# Patient Record
Sex: Female | Born: 1955 | Race: White | Hispanic: No | Marital: Single | Smoking: Never smoker
Health system: Southern US, Community
[De-identification: ages and names within clinical notes are randomized; demographics above are authoritative.]

## PROBLEM LIST (undated history)

## (undated) DIAGNOSIS — K219 Gastro-esophageal reflux disease without esophagitis: Secondary | ICD-10-CM

## (undated) DIAGNOSIS — M1990 Unspecified osteoarthritis, unspecified site: Secondary | ICD-10-CM

## (undated) DIAGNOSIS — E669 Obesity, unspecified: Secondary | ICD-10-CM

## (undated) DIAGNOSIS — R51 Headache: Secondary | ICD-10-CM

## (undated) DIAGNOSIS — N390 Urinary tract infection, site not specified: Secondary | ICD-10-CM

## (undated) DIAGNOSIS — R519 Headache, unspecified: Secondary | ICD-10-CM

## (undated) DIAGNOSIS — Z9889 Other specified postprocedural states: Secondary | ICD-10-CM

## (undated) DIAGNOSIS — D649 Anemia, unspecified: Secondary | ICD-10-CM

## (undated) DIAGNOSIS — R112 Nausea with vomiting, unspecified: Secondary | ICD-10-CM

## (undated) DIAGNOSIS — I10 Essential (primary) hypertension: Secondary | ICD-10-CM

## (undated) DIAGNOSIS — K449 Diaphragmatic hernia without obstruction or gangrene: Secondary | ICD-10-CM

## (undated) DIAGNOSIS — T7840XA Allergy, unspecified, initial encounter: Secondary | ICD-10-CM

## (undated) DIAGNOSIS — J45909 Unspecified asthma, uncomplicated: Secondary | ICD-10-CM

## (undated) DIAGNOSIS — K5730 Diverticulosis of large intestine without perforation or abscess without bleeding: Secondary | ICD-10-CM

## (undated) DIAGNOSIS — H269 Unspecified cataract: Secondary | ICD-10-CM

## (undated) DIAGNOSIS — Z9109 Other allergy status, other than to drugs and biological substances: Secondary | ICD-10-CM

## (undated) DIAGNOSIS — Z87442 Personal history of urinary calculi: Secondary | ICD-10-CM

## (undated) DIAGNOSIS — N200 Calculus of kidney: Secondary | ICD-10-CM

## (undated) DIAGNOSIS — R42 Dizziness and giddiness: Secondary | ICD-10-CM

## (undated) DIAGNOSIS — E785 Hyperlipidemia, unspecified: Secondary | ICD-10-CM

## (undated) HISTORY — PX: UPPER GASTROINTESTINAL ENDOSCOPY: SHX188

## (undated) HISTORY — DX: Gastro-esophageal reflux disease without esophagitis: K21.9

## (undated) HISTORY — DX: Unspecified osteoarthritis, unspecified site: M19.90

## (undated) HISTORY — DX: Diaphragmatic hernia without obstruction or gangrene: K44.9

## (undated) HISTORY — DX: Diverticulosis of large intestine without perforation or abscess without bleeding: K57.30

## (undated) HISTORY — PX: EYE SURGERY: SHX253

## (undated) HISTORY — DX: Essential (primary) hypertension: I10

## (undated) HISTORY — DX: Other allergy status, other than to drugs and biological substances: Z91.09

## (undated) HISTORY — DX: Hyperlipidemia, unspecified: E78.5

## (undated) HISTORY — PX: ESOPHAGEAL DILATION: SHX303

## (undated) HISTORY — PX: WISDOM TOOTH EXTRACTION: SHX21

## (undated) HISTORY — DX: Calculus of kidney: N20.0

## (undated) HISTORY — DX: Unspecified asthma, uncomplicated: J45.909

## (undated) HISTORY — DX: Obesity, unspecified: E66.9

## (undated) HISTORY — PX: COLONOSCOPY: SHX174

## (undated) HISTORY — PX: BREAST BIOPSY: SHX20

## (undated) HISTORY — DX: Allergy, unspecified, initial encounter: T78.40XA

## (undated) HISTORY — DX: Unspecified cataract: H26.9

---

## 1985-01-04 HISTORY — PX: CHOLECYSTECTOMY: SHX55

## 1997-05-15 ENCOUNTER — Ambulatory Visit (HOSPITAL_COMMUNITY): Admission: RE | Admit: 1997-05-15 | Discharge: 1997-05-15 | Payer: Self-pay | Admitting: Emergency Medicine

## 1997-05-20 ENCOUNTER — Ambulatory Visit (HOSPITAL_COMMUNITY): Admission: RE | Admit: 1997-05-20 | Discharge: 1997-05-20 | Payer: Self-pay | Admitting: Emergency Medicine

## 1997-05-21 ENCOUNTER — Ambulatory Visit (HOSPITAL_COMMUNITY): Admission: RE | Admit: 1997-05-21 | Discharge: 1997-05-21 | Payer: Self-pay | Admitting: Emergency Medicine

## 1997-11-21 ENCOUNTER — Encounter: Payer: Self-pay | Admitting: Emergency Medicine

## 1997-11-21 ENCOUNTER — Ambulatory Visit (HOSPITAL_COMMUNITY): Admission: RE | Admit: 1997-11-21 | Discharge: 1997-11-21 | Payer: Self-pay

## 1998-07-31 ENCOUNTER — Ambulatory Visit (HOSPITAL_COMMUNITY): Admission: RE | Admit: 1998-07-31 | Discharge: 1998-07-31 | Payer: Self-pay | Admitting: Emergency Medicine

## 1998-07-31 ENCOUNTER — Encounter: Payer: Self-pay | Admitting: Emergency Medicine

## 1998-08-06 ENCOUNTER — Ambulatory Visit (HOSPITAL_COMMUNITY): Admission: RE | Admit: 1998-08-06 | Discharge: 1998-08-06 | Payer: Self-pay | Admitting: Emergency Medicine

## 1998-08-06 ENCOUNTER — Encounter: Payer: Self-pay | Admitting: Emergency Medicine

## 1999-01-01 ENCOUNTER — Other Ambulatory Visit: Admission: RE | Admit: 1999-01-01 | Discharge: 1999-01-01 | Payer: Self-pay | Admitting: *Deleted

## 1999-02-09 ENCOUNTER — Ambulatory Visit (HOSPITAL_COMMUNITY): Admission: RE | Admit: 1999-02-09 | Discharge: 1999-02-09 | Payer: Self-pay | Admitting: *Deleted

## 1999-02-09 ENCOUNTER — Encounter: Payer: Self-pay | Admitting: *Deleted

## 1999-08-10 ENCOUNTER — Encounter: Payer: Self-pay | Admitting: *Deleted

## 1999-08-10 ENCOUNTER — Ambulatory Visit (HOSPITAL_COMMUNITY): Admission: RE | Admit: 1999-08-10 | Discharge: 1999-08-10 | Payer: Self-pay | Admitting: *Deleted

## 2000-01-12 ENCOUNTER — Encounter: Payer: Self-pay | Admitting: *Deleted

## 2000-01-12 ENCOUNTER — Encounter: Admission: RE | Admit: 2000-01-12 | Discharge: 2000-01-12 | Payer: Self-pay | Admitting: *Deleted

## 2000-01-12 ENCOUNTER — Other Ambulatory Visit: Admission: RE | Admit: 2000-01-12 | Discharge: 2000-01-12 | Payer: Self-pay | Admitting: *Deleted

## 2000-08-08 ENCOUNTER — Ambulatory Visit (HOSPITAL_COMMUNITY): Admission: RE | Admit: 2000-08-08 | Discharge: 2000-08-08 | Payer: Self-pay | Admitting: *Deleted

## 2000-08-08 ENCOUNTER — Encounter: Payer: Self-pay | Admitting: *Deleted

## 2000-09-04 ENCOUNTER — Encounter: Payer: Self-pay | Admitting: Emergency Medicine

## 2000-09-04 ENCOUNTER — Emergency Department (HOSPITAL_COMMUNITY): Admission: EM | Admit: 2000-09-04 | Discharge: 2000-09-04 | Payer: Self-pay | Admitting: Emergency Medicine

## 2001-03-15 ENCOUNTER — Other Ambulatory Visit: Admission: RE | Admit: 2001-03-15 | Discharge: 2001-03-15 | Payer: Self-pay | Admitting: Internal Medicine

## 2001-08-16 ENCOUNTER — Ambulatory Visit (HOSPITAL_COMMUNITY): Admission: RE | Admit: 2001-08-16 | Discharge: 2001-08-16 | Payer: Self-pay | Admitting: Internal Medicine

## 2001-08-16 ENCOUNTER — Encounter: Payer: Self-pay | Admitting: Internal Medicine

## 2002-08-20 ENCOUNTER — Ambulatory Visit (HOSPITAL_COMMUNITY): Admission: RE | Admit: 2002-08-20 | Discharge: 2002-08-20 | Payer: Self-pay | Admitting: Internal Medicine

## 2002-08-20 ENCOUNTER — Encounter: Payer: Self-pay | Admitting: Internal Medicine

## 2003-07-22 ENCOUNTER — Other Ambulatory Visit: Admission: RE | Admit: 2003-07-22 | Discharge: 2003-07-22 | Payer: Self-pay | Admitting: Internal Medicine

## 2003-09-16 ENCOUNTER — Ambulatory Visit (HOSPITAL_COMMUNITY): Admission: RE | Admit: 2003-09-16 | Discharge: 2003-09-16 | Payer: Self-pay | Admitting: Internal Medicine

## 2004-07-27 ENCOUNTER — Encounter: Admission: RE | Admit: 2004-07-27 | Discharge: 2004-07-27 | Payer: Self-pay | Admitting: Internal Medicine

## 2005-01-04 HISTORY — PX: KNEE ARTHROSCOPY: SUR90

## 2005-09-08 ENCOUNTER — Encounter: Admission: RE | Admit: 2005-09-08 | Discharge: 2005-09-08 | Payer: Self-pay | Admitting: Internal Medicine

## 2006-01-04 DIAGNOSIS — N200 Calculus of kidney: Secondary | ICD-10-CM

## 2006-01-04 HISTORY — DX: Calculus of kidney: N20.0

## 2006-09-21 ENCOUNTER — Encounter: Admission: RE | Admit: 2006-09-21 | Discharge: 2006-09-21 | Payer: Self-pay | Admitting: Internal Medicine

## 2007-10-02 ENCOUNTER — Encounter: Admission: RE | Admit: 2007-10-02 | Discharge: 2007-10-02 | Payer: Self-pay | Admitting: Internal Medicine

## 2007-11-01 ENCOUNTER — Other Ambulatory Visit: Admission: RE | Admit: 2007-11-01 | Discharge: 2007-11-01 | Payer: Self-pay | Admitting: Internal Medicine

## 2007-11-03 ENCOUNTER — Emergency Department (HOSPITAL_COMMUNITY): Admission: EM | Admit: 2007-11-03 | Discharge: 2007-11-04 | Payer: Self-pay | Admitting: Emergency Medicine

## 2008-10-02 ENCOUNTER — Encounter: Admission: RE | Admit: 2008-10-02 | Discharge: 2008-10-02 | Payer: Self-pay | Admitting: Internal Medicine

## 2008-11-22 ENCOUNTER — Encounter (INDEPENDENT_AMBULATORY_CARE_PROVIDER_SITE_OTHER): Payer: Self-pay | Admitting: *Deleted

## 2008-12-10 ENCOUNTER — Encounter (INDEPENDENT_AMBULATORY_CARE_PROVIDER_SITE_OTHER): Payer: Self-pay | Admitting: *Deleted

## 2008-12-11 ENCOUNTER — Ambulatory Visit: Payer: Self-pay | Admitting: Gastroenterology

## 2009-01-22 ENCOUNTER — Ambulatory Visit: Payer: Self-pay | Admitting: Gastroenterology

## 2009-10-20 ENCOUNTER — Encounter: Admission: RE | Admit: 2009-10-20 | Discharge: 2009-10-20 | Payer: Self-pay | Admitting: Internal Medicine

## 2009-12-19 ENCOUNTER — Other Ambulatory Visit
Admission: RE | Admit: 2009-12-19 | Discharge: 2009-12-19 | Payer: Self-pay | Source: Home / Self Care | Admitting: Internal Medicine

## 2010-01-26 ENCOUNTER — Encounter: Payer: Self-pay | Admitting: Internal Medicine

## 2010-02-05 NOTE — Procedures (Signed)
Summary: Colonoscopy  Patient: Francetta Ilg Note: All result statuses are Final unless otherwise noted.  Tests: (1) Colonoscopy (COL)   COL Colonoscopy           DONE     [facility]     [address]     [place], [place]  [address]           COLONOSCOPY PROCEDURE REPORT           PATIENT:  Christine [name], Christine [name]  MR#:  [id number]     BIRTHDATE:  [birth year], 53 yrs. old  GENDER:  female           ENDOSCOPIST:  Vania Rea. Jarold Motto, MD, [facility]     Referred by:  Marisue Brooklyn, D.O.           PROCEDURE DATE:  01/22/2009     PROCEDURE:  Higher-risk screening colonoscopy G0105           ASA CLASS:  Class II     INDICATIONS:  family history of colon cancer MOTHER           MEDICATIONS:   Fentanyl 50 mcg IV, Versed 7 mg IV           DESCRIPTION OF PROCEDURE:   After the risks benefits and     alternatives of the procedure were thoroughly explained, informed     consent was obtained.  Digital rectal exam was performed and     revealed no abnormalities.   The LB [id number] endoscope     was introduced through the anus and advanced to the cecum, which     was identified by both the appendix and ileocecal valve, without     limitations.  The quality of the prep was excellent, using     MoviPrep.  The instrument was then slowly withdrawn as the colon     was fully examined.     <<PROCEDUREIMAGES>>           FINDINGS:  Moderate diverticulosis was found sigmoid to descending     No polyps or cancers were seen.  This was otherwise a normal     examination of the colon.   Retroflexed views in the rectum     revealed no abnormalities.    The scope was then withdrawn from     the patient and the procedure completed.           COMPLICATIONS:  None           ENDOSCOPIC IMPRESSION:     1) Moderate diverticulosis in the sigmoid to descending     2) No polyps or cancers     3) Otherwise normal examination     RECOMMENDATIONS:     1) high fiber diet     2) Given your significant  family history of colon cancer, you     should have a repeat colonoscopy in 5 years           REPEAT EXAM:  No           ______________________________     Vania Rea. Jarold Motto, MD, [facility]           CC:           n.     eSIGNED:   Vania Rea. Patterson at 01/22/2009 11:46 AM           Domenic Moras, [id number]  Note: An exclamation mark (!) indicates a result that was not dispersed into the  flowsheet. Document Creation Date: 01/22/2009 11:45 AM _______________________________________________________________________  (1) Order result status: Final Collection or observation date-time: 01/22/2009 11:40 Requested date-time:  Receipt date-time:  Reported date-time:  Referring Physician:   Ordering Physician: Sheryn Bison [id number]) Specimen Source:  Source: Launa Grill Order Number: [id number] Lab site:   Appended Document: Colonoscopy    Clinical Lists Changes  Observations: Added new observation of COLONNXTDUE: 01/2014 (01/22/2009 13:35)

## 2010-10-15 ENCOUNTER — Other Ambulatory Visit: Payer: Self-pay | Admitting: Internal Medicine

## 2010-10-15 DIAGNOSIS — Z1231 Encounter for screening mammogram for malignant neoplasm of breast: Secondary | ICD-10-CM

## 2010-11-20 ENCOUNTER — Ambulatory Visit
Admission: RE | Admit: 2010-11-20 | Discharge: 2010-11-20 | Disposition: A | Discharge status: Home / Self Care | Payer: BC Managed Care – PPO | Source: Ambulatory Visit | Attending: Internal Medicine | Admitting: Internal Medicine

## 2010-11-20 DIAGNOSIS — Z1231 Encounter for screening mammogram for malignant neoplasm of breast: Secondary | ICD-10-CM

## 2011-04-01 ENCOUNTER — Other Ambulatory Visit: Payer: Self-pay

## 2011-12-23 ENCOUNTER — Other Ambulatory Visit: Payer: Self-pay | Admitting: Internal Medicine

## 2011-12-23 DIAGNOSIS — Z1231 Encounter for screening mammogram for malignant neoplasm of breast: Secondary | ICD-10-CM

## 2012-01-14 ENCOUNTER — Ambulatory Visit
Admission: RE | Admit: 2012-01-14 | Discharge: 2012-01-14 | Disposition: A | Discharge status: Home / Self Care | Payer: BC Managed Care – PPO | Source: Ambulatory Visit | Attending: Internal Medicine | Admitting: Internal Medicine

## 2012-01-14 DIAGNOSIS — Z1231 Encounter for screening mammogram for malignant neoplasm of breast: Secondary | ICD-10-CM

## 2013-01-17 ENCOUNTER — Other Ambulatory Visit: Payer: Self-pay

## 2013-01-17 DIAGNOSIS — Z1231 Encounter for screening mammogram for malignant neoplasm of breast: Secondary | ICD-10-CM

## 2013-02-19 ENCOUNTER — Ambulatory Visit
Admission: RE | Admit: 2013-02-19 | Discharge: 2013-02-19 | Disposition: A | Discharge status: Home / Self Care | Payer: BC Managed Care – PPO | Source: Ambulatory Visit

## 2013-02-19 DIAGNOSIS — Z1231 Encounter for screening mammogram for malignant neoplasm of breast: Secondary | ICD-10-CM

## 2013-04-06 ENCOUNTER — Other Ambulatory Visit: Payer: Self-pay

## 2013-04-06 MED ORDER — VALSARTAN-HYDROCHLOROTHIAZIDE 320-12.5 MG PO TABS: 1.0000 | ORAL_TABLET | Freq: Every day | ORAL | Status: DC

## 2013-04-06 MED ORDER — METHOCARBAMOL 750 MG PO TABS: 750.0000 mg | ORAL_TABLET | Freq: Two times a day (BID) | ORAL | Status: DC | PRN

## 2013-04-10 ENCOUNTER — Encounter: Payer: Self-pay | Admitting: Emergency Medicine

## 2013-04-11 ENCOUNTER — Encounter: Payer: Self-pay | Admitting: Emergency Medicine

## 2013-05-16 ENCOUNTER — Ambulatory Visit (INDEPENDENT_AMBULATORY_CARE_PROVIDER_SITE_OTHER): Payer: BC Managed Care – PPO | Admitting: Family Medicine

## 2013-05-16 VITALS — BP 134/72 | HR 100 | Temp 98.3°F | Resp 20 | Ht 62.0 in | Wt 258.0 lb

## 2013-05-16 DIAGNOSIS — R05 Cough: Secondary | ICD-10-CM

## 2013-05-16 DIAGNOSIS — H6520 Chronic serous otitis media, unspecified ear: Secondary | ICD-10-CM

## 2013-05-16 DIAGNOSIS — R059 Cough, unspecified: Secondary | ICD-10-CM

## 2013-05-16 DIAGNOSIS — J029 Acute pharyngitis, unspecified: Secondary | ICD-10-CM

## 2013-05-16 MED ORDER — HYDROCOD POLST-CHLORPHEN POLST 10-8 MG/5ML PO LQCR: 5.0000 mL | Freq: Every evening | ORAL | Status: DC | PRN

## 2013-05-16 MED ORDER — AMOXICILLIN-POT CLAVULANATE 875-125 MG PO TABS: 1.0000 | ORAL_TABLET | Freq: Two times a day (BID) | ORAL | Status: DC

## 2013-05-16 NOTE — Progress Notes (Signed)
This chart was scribed for Robyn Haber, MD by Vernell Barrier, Medical Scribe. This patient's care was started at 8:45 PM   Christine [name] MRN: [id number], DOB: 02-23-1955, 58 y.o. Date of Encounter: 05/16/2013, 8:44 PM  Primary Physician: No PCP Per Patient  Chief Complaint:  Chief Complaint  Patient presents with   Sinusitis    x's 4 days   Cough    x's 4 days    HPI: 57 y.o. year old female w/ hx of middle ear infections presents with a 4 day history of nasal congestion, post nasal drip, sore throat, and cough. States post nasal drip has been causing sore throat. Has been using Claritin D and a Neti pot to help clear out sinues. Gargled salt water last night before bed and believes that may have irritated throat even more. Does feel as if sinuses have been cleared out. Afebrile. No chills.   Past Medical History  Diagnosis Date   Environmental allergies    Arthritis    Asthma      Home Meds: Prior to Admission medications   Medication Sig Start Date End Date Taking? Authorizing Provider  meloxicam (MOBIC) 7.5 MG tablet Take 7.5 mg by mouth daily.   Yes Historical Provider, MD  methocarbamol (ROBAXIN) 750 MG tablet Take 1 tablet (750 mg total) by mouth 2 (two) times daily as needed for muscle spasms. 04/06/13  Yes Vicie Mutters, PA-C  valsartan-hydrochlorothiazide (DIOVAN-HCT) 320-12.5 MG per tablet Take 1 tablet by mouth daily. 04/06/13  Yes Vicie Mutters, PA-C    Allergies:  Allergies  Allergen Reactions   Codeine     REACTION: nausea/vomiting    History   Social History   Marital Status: Single    Spouse Name: N/A    Number of Children: N/A   Years of Education: N/A   Occupational History   Not on file.   Social History Main Topics   Smoking status: Never Smoker    Smokeless tobacco: Not on file   Alcohol Use: Not on file   Drug Use: Not on file   Sexual Activity: Not on file   Other Topics Concern   Not on file   Social History  Narrative   No narrative on file     Review of Systems: Constitutional: negative for chills, fever, night sweats or weight changes HEENT: Positive for sore throat, cough, postnasal drip, nasal congestion Cardiovascular: negative for chest pain or palpitations Respiratory: negative for hemoptysis, wheezing, or shortness of breath Abdominal: negative for abdominal pain, nausea, vomiting or diarrhea Dermatological: negative for rash Neurologic: negative for headache   Physical Exam: Blood pressure 134/72, pulse 100, temperature 98.3 F (36.8 C), temperature source Oral, resp. rate 20, height 5\' 2"  (1.575 m), weight 258 lb (117.028 kg), last menstrual period 04/16/2013, SpO2 97.00%., Body mass index is 47.18 kg/(m^2). General: Well developed, well nourished, in no acute distress. Head: Normocephalic, atraumatic, eyes without discharge, sclera non-icteric, nares are congested. Bilateral auditory canals clear. No sinus TTP. Oral cavity moist, dentition normal. Posterior pharynx with post nasal drip and mild erythema. No peritonsillar abscess or tonsillar exudate. Neck: Supple. No thyromegaly. Full ROM. No lymphadenopathy. Lungs: Coarse breath sounds bilaterally without wheezes, rales, or rhonchi. Breathing is unlabored.  Heart: RRR with S1 S2. No murmurs, rubs, or gallops appreciated. Msk:  Strength and tone normal for age. Extremities: No clubbing or cyanosis. No edema. Neuro: Alert and oriented X 3. Moves all extremities spontaneously. CNII-XII grossly in tact. Psych:  Responds to questions  appropriately with a normal affect.   Patient's throat is very red and mildly swollen Neck is supple with no adenopathy Patient's tympanic membranes are markedly scarred and retracted with apparent healing of the perforation on the left side. ASSESSMENT AND PLAN:  58 y.o. year old female Cough - Plan: chlorpheniramine-HYDROcodone (TUSSIONEX PENNKINETIC ER) 10-8 MG/5ML LQCR, amoxicillin-clavulanate  (AUGMENTIN) 875-125 MG per tablet  Chronic serous OM (otitis media) - Plan: amoxicillin-clavulanate (AUGMENTIN) 875-125 MG per tablet  Acute pharyngitis - Plan: amoxicillin-clavulanate (AUGMENTIN) 875-125 MG per tablet, Culture, Group A Strep   - -Tussinex & Augmentin -Rest/fluids -RTC precautions -RTC 3-5 days if no improvement  Signed, Robyn Haber, MD 05/16/2013 8:44 PM

## 2013-05-19 LAB — CULTURE, GROUP A STREP: Organism ID, Bacteria: NORMAL

## 2013-05-29 ENCOUNTER — Other Ambulatory Visit: Payer: Self-pay | Admitting: *Deleted

## 2013-05-29 MED ORDER — MELOXICAM 7.5 MG PO TABS: 7.5000 mg | ORAL_TABLET | Freq: Every day | ORAL | Status: DC

## 2013-05-31 ENCOUNTER — Other Ambulatory Visit: Payer: Self-pay | Admitting: *Deleted

## 2013-05-31 ENCOUNTER — Other Ambulatory Visit: Payer: Self-pay | Admitting: Emergency Medicine

## 2013-05-31 MED ORDER — LANSOPRAZOLE 30 MG PO CPDR: 30.0000 mg | DELAYED_RELEASE_CAPSULE | Freq: Every day | ORAL | Status: DC

## 2013-06-01 ENCOUNTER — Ambulatory Visit (INDEPENDENT_AMBULATORY_CARE_PROVIDER_SITE_OTHER): Payer: BC Managed Care – PPO | Admitting: Emergency Medicine

## 2013-06-01 ENCOUNTER — Encounter: Payer: Self-pay | Admitting: Emergency Medicine

## 2013-06-01 ENCOUNTER — Other Ambulatory Visit: Payer: Self-pay | Admitting: Emergency Medicine

## 2013-06-01 VITALS — BP 122/80 | HR 88 | Temp 97.8°F | Resp 18 | Ht 62.0 in | Wt 255.0 lb

## 2013-06-01 DIAGNOSIS — K449 Diaphragmatic hernia without obstruction or gangrene: Secondary | ICD-10-CM

## 2013-06-01 DIAGNOSIS — E785 Hyperlipidemia, unspecified: Secondary | ICD-10-CM

## 2013-06-01 DIAGNOSIS — E669 Obesity, unspecified: Secondary | ICD-10-CM

## 2013-06-01 DIAGNOSIS — K219 Gastro-esophageal reflux disease without esophagitis: Secondary | ICD-10-CM

## 2013-06-01 DIAGNOSIS — M8580 Other specified disorders of bone density and structure, unspecified site: Secondary | ICD-10-CM

## 2013-06-01 DIAGNOSIS — Z0000 Encounter for general adult medical examination without abnormal findings: Secondary | ICD-10-CM

## 2013-06-01 DIAGNOSIS — E782 Mixed hyperlipidemia: Secondary | ICD-10-CM

## 2013-06-01 DIAGNOSIS — I10 Essential (primary) hypertension: Secondary | ICD-10-CM

## 2013-06-01 DIAGNOSIS — Z1212 Encounter for screening for malignant neoplasm of rectum: Secondary | ICD-10-CM

## 2013-06-01 DIAGNOSIS — D649 Anemia, unspecified: Secondary | ICD-10-CM

## 2013-06-01 DIAGNOSIS — Z111 Encounter for screening for respiratory tuberculosis: Secondary | ICD-10-CM

## 2013-06-01 LAB — CBC WITH DIFFERENTIAL/PLATELET
BASOS PCT: 1 % (ref 0–1)
Basophils Absolute: 0.1 10*3/uL (ref 0.0–0.1)
Eosinophils Absolute: 0.3 10*3/uL (ref 0.0–0.7)
Eosinophils Relative: 4 % (ref 0–5)
HCT: 39.3 % (ref 36.0–46.0)
HEMOGLOBIN: 13.3 g/dL (ref 12.0–15.0)
LYMPHS ABS: 2.5 10*3/uL (ref 0.7–4.0)
LYMPHS PCT: 29 % (ref 12–46)
MCH: 29.7 pg (ref 26.0–34.0)
MCHC: 33.8 g/dL (ref 30.0–36.0)
MCV: 87.7 fL (ref 78.0–100.0)
MONO ABS: 0.6 10*3/uL (ref 0.1–1.0)
MONOS PCT: 7 % (ref 3–12)
Neutro Abs: 5.1 10*3/uL (ref 1.7–7.7)
Neutrophils Relative %: 59 % (ref 43–77)
Platelets: 324 10*3/uL (ref 150–400)
RBC: 4.48 MIL/uL (ref 3.87–5.11)
RDW: 14 % (ref 11.5–15.5)
WBC: 8.6 10*3/uL (ref 4.0–10.5)

## 2013-06-01 LAB — HEMOGLOBIN A1C
Hgb A1c MFr Bld: 5.7 % — ABNORMAL HIGH (ref <5.7)
MEAN PLASMA GLUCOSE: 117 mg/dL — AB (ref <117)

## 2013-06-01 MED ORDER — MELOXICAM 7.5 MG PO TABS: 7.5000 mg | ORAL_TABLET | Freq: Every day | ORAL | Status: DC

## 2013-06-01 MED ORDER — METHOCARBAMOL 750 MG PO TABS: 750.0000 mg | ORAL_TABLET | Freq: Two times a day (BID) | ORAL | Status: DC | PRN

## 2013-06-01 MED ORDER — FLUCONAZOLE 150 MG PO TABS: 150.0000 mg | ORAL_TABLET | ORAL | Status: DC

## 2013-06-01 MED ORDER — VALSARTAN-HYDROCHLOROTHIAZIDE 320-12.5 MG PO TABS: 1.0000 | ORAL_TABLET | Freq: Every day | ORAL | Status: DC

## 2013-06-01 MED ORDER — LANSOPRAZOLE 30 MG PO CPDR: 30.0000 mg | DELAYED_RELEASE_CAPSULE | Freq: Every day | ORAL | Status: DC

## 2013-06-01 NOTE — Patient Instructions (Signed)
   Bad carbs also include fruit juice, alcohol, and sweet tea. These are empty calories that do not signal to your brain that you are full.   Please remember the good carbs are still carbs which convert into sugar. So please measure them out no more than 1/2-1 cup of rice, oatmeal, pasta, and beans.  Veggies are however free foods! Pile them on.   I like lean protein at every meal such as chicken, turkey, pork chops, cottage cheese, etc. Just do not fry these meats and please center your meal around vegetable, the meats should be a side dish.   No all fruit is created equal. Please see the list below, the fruit at the bottom is higher in sugars than the fruit at the top   We want weight loss that will last so you should lose 1-2 pounds a week.  THAT IS IT! Please pick THREE things a month to change. Once it is a habit check off the item. Then pick another three items off the list to become habits.  If you are already doing a habit on the list GREAT!  Cross that item off! o Don't drink your calories. Ie, alcohol, soda, fruit juice, and sweet tea.  o Drink more water. Drink a glass when you feel hungry or before each meal.  o Eat breakfast - Complex carb and protein (likeDannon light and fit yogurt, oatmeal, fruit, eggs, turkey bacon). o Measure your cereal.  Eat no more than one cup a day. (ie Kashi) o Eat an apple a day. o Add a vegetable a day. o Try a new vegetable a month. o Use Pam! Stop using oil or butter to cook. o Don't finish your plate or use smaller plates. o Share your dessert. o Eat sugar free Jello for dessert or frozen grapes. o Don't eat 2-3 hours before bed. o Switch to whole wheat bread, pasta, and brown rice. o Make healthier choices when you eat out. No fries! o Pick baked chicken, NOT fried. o Don't forget to SLOW DOWN when you eat. It is not going anywhere.  o Take the stairs. o Park far away in the parking lot o Lift soup cans (or weights) for 10 minutes while  watching TV. o Walk at work for 10 minutes during break. o Walk outside 1 time a week with your friend, kids, dog, or significant other. o Start a walking group at church. o Walk the mall as much as you can tolerate.  o Keep a food diary. o Weigh yourself daily. o Walk for 15 minutes 3 days per week. o Cook at home more often and eat out less.  If life happens and you go back to old habits, it is okay.  Just start over. You can do it!   If you experience chest pain, get short of breath, or tired during the exercise, please stop immediately and inform your doctor.   

## 2013-06-01 NOTE — Progress Notes (Signed)
Subjective:    Patient ID: Christine [name], female    DOB: [birth year], 58 y.o.   MRN: [id number]  HPI Comments: 58 yo pleasant Obese WF CPE and presents for 3 month F/U for HTN, Cholesterol, Pre-Dm, D. Deficient. She did not f/u AD to get labs rechecked. She is not exercising routinely but is keeping busy. She is trying to improve her diet.  She is up 11 # since last CPE Last labs T 186 TG L110 A1C 5.6 MAG 2.2 INSULIN 17 D 54  She notes + increase with allergy drainage and ear pressure. She has not had any relief with OTC. She notes she does not use OTC routinely. She notes drainage clear.  She has had increased yeast infections under both breasts and in groin with increase in heat with weather change.   She is seeing Dr Tonita Cong in regards to her bilateral shoulder pain. She denies any routine exercises/ PT. She notes he has done x-rays/ injections w/o relief. She has f/u soon. She denies any specific injury.  She notes reflux has been controlled.   Gastrophageal Reflux  Hypertension     Medication List       This list is accurate as of: 06/01/13 11:59 PM.  Always use your most recent med list.               D 5000 PO  Take by mouth.     fluconazole 150 MG tablet  Commonly known as:  DIFLUCAN  Take 1 tablet (150 mg total) by mouth once a week.     guaifenesin 400 MG Tabs tablet  Commonly known as:  HUMIBID E  Take 400 mg by mouth every 4 (four) hours.     Krill Oil 300 MG Caps  Take by mouth.     lansoprazole 30 MG capsule  Commonly known as:  PREVACID  Take 1 capsule (30 mg total) by mouth daily.     magnesium gluconate 500 MG tablet  Commonly known as:  MAGONATE  Take 500 mg by mouth daily.     meloxicam 7.5 MG tablet  Commonly known as:  MOBIC  Take 1 tablet (7.5 mg total) by mouth daily.     methocarbamol 750 MG tablet  Commonly known as:  ROBAXIN  Take 1 tablet (750 mg total) by mouth 2 (two) times daily as needed for muscle spasms.      valsartan-hydrochlorothiazide 320-12.5 MG per tablet  Commonly known as:  DIOVAN-HCT  Take 1 tablet by mouth daily.       Allergies  Allergen Reactions  . Codeine     REACTION: nausea/vomiting   Past Medical History  Diagnosis Date  . Environmental allergies   . Arthritis   . Asthma   . Hypertension   . Hyperlipidemia   . GERD (gastroesophageal reflux disease)   . Hiatal hernia     with stricture dilation  . Kidney stones 2008   Past Surgical History  Procedure Laterality Date  . Cholecystectomy  1987  . Esophageal dilation    . Knee arthroscopy Right 2007    History  Substance Use Topics  . Smoking status: Never Smoker   . Smokeless tobacco: Not on file  . Alcohol Use: No   Family History  Problem Relation Age of Onset  . Throat cancer Father   . Cancer Father     throat  . Stroke Mother   . Hypertension Mother   . Hyperlipidemia Mother   . Heart disease  Mother     MAINTENANCE: Colonoscopy:2011 due 2016 with [place]: 02/19/13 BMD:2013 osteopenia at [place] wnl EYE:2/ 2015 Dentist: q 6 month due 9/15  IMMUNIZATIONS: Tdap:2012 Pneumovax:2003, 2008 Zostavax: Influenza:2014  Patient Care Team: No Pcp Per Patient as PCP - General (General Practice) Meredith Pel, MD as Consulting Physician (Orthopedic Surgery) Sable Feil, MD as Consulting Physician (Gastroenterology) Allyn Kenner, DO as Consulting Physician (Obstetrics and Gynecology) Eulis Manly. Gershon Crane, MD as Consulting Physician (Ophthalmology) Selinda Orion, MD as Consulting Physician (Dermatology)     Review of Systems  HENT: Positive for postnasal drip.   Musculoskeletal: Positive for arthralgias.  Skin: Positive for rash.  All other systems reviewed and are negative.  BP 122/80  Pulse 88  Temp(Src) 97.8 F (36.6 C) (Temporal)  Resp 18  Ht 5\' 2"  (1.575 m)  Wt 255 lb (115.667 kg)  BMI 46.63 kg/m2  LMP 05/20/2013     Objective:   Physical Exam   Nursing note and vitals reviewed. Constitutional: She is oriented to person, place, and time. She appears well-developed and well-nourished. No distress.  obese  HENT:  Head: Normocephalic and atraumatic.  Right Ear: External ear normal.  Left Ear: External ear normal.  Nose: Nose normal.  Mouth/Throat: Oropharynx is clear and moist. No oropharyngeal exudate.  Cloudy TM's bilaterally R>L  Eyes: Conjunctivae and EOM are normal. Pupils are equal, round, and reactive to light. Right eye exhibits no discharge. Left eye exhibits no discharge. No scleral icterus.  Neck: Normal range of motion. Neck supple. No JVD present. No tracheal deviation present. No thyromegaly present.  Cardiovascular: Normal rate, regular rhythm, normal heart sounds and intact distal pulses.   Pulmonary/Chest: Effort normal and breath sounds normal.  Abdominal: Soft. Bowel sounds are normal. She exhibits no distension and no mass. There is no tenderness. There is no rebound and no guarding.  Genitourinary:  Def GYN  Musculoskeletal: She exhibits tenderness. She exhibits no edema.  Decrease ROM bilateral shoulders with pain  Lymphadenopathy:    She has no cervical adenopathy.  Neurological: She is alert and oriented to person, place, and time. She has normal reflexes. No cranial nerve deficit. She exhibits normal muscle tone. Coordination normal.  Skin: Skin is warm and dry. Rash noted. There is erythema. No pallor.  Under both breasts/ groin- primarily flat erythematous with irreg raised borders   Psychiatric: She has a normal mood and affect. Her behavior is normal. Judgment and thought content normal.      AORTA SCAN WNL EKG NSCSPT     Assessment & Plan:  1. CPE- Update screening labs/ History/ Immunizations/ Testing as needed. Advised healthy diet, QD exercise, increase H20 and continue RX/ Vitamins AD.  2.  F/U for Obesity, HTN, Cholesterol, Pre-Dm, D. Deficient. Needs healthy diet, cardio QD and obtain  healthy weight. Check Labs, Check BP if >130/80 call office   3. Shoulder pain- Stretches/ exercise explain, keep Ortho f/u  4. Yeast- Hygiene explained, Diflucan 150 mg 1 po q week #2, GOld bond powder AD, f/u with results  5. Allergic rhinitis- Allegra OTC, increase H2o, allergy hygiene explained. ADD Flonase AD

## 2013-06-02 LAB — HEPATIC FUNCTION PANEL
ALT: 27 U/L (ref 0–35)
AST: 21 U/L (ref 0–37)
Albumin: 4.4 g/dL (ref 3.5–5.2)
Alkaline Phosphatase: 72 U/L (ref 39–117)
BILIRUBIN INDIRECT: 0.3 mg/dL (ref 0.2–1.2)
Bilirubin, Direct: 0.1 mg/dL (ref 0.0–0.3)
Total Bilirubin: 0.4 mg/dL (ref 0.2–1.2)
Total Protein: 7.5 g/dL (ref 6.0–8.3)

## 2013-06-02 LAB — URINALYSIS, ROUTINE W REFLEX MICROSCOPIC
Bilirubin Urine: NEGATIVE
GLUCOSE, UA: NEGATIVE mg/dL
Hgb urine dipstick: NEGATIVE
KETONES UR: NEGATIVE mg/dL
Leukocytes, UA: NEGATIVE
NITRITE: NEGATIVE
Protein, ur: NEGATIVE mg/dL
Specific Gravity, Urine: 1.006 (ref 1.005–1.030)
Urobilinogen, UA: 0.2 mg/dL (ref 0.0–1.0)
pH: 6.5 (ref 5.0–8.0)

## 2013-06-02 LAB — LIPID PANEL
CHOL/HDL RATIO: 4.4 ratio
Cholesterol: 194 mg/dL (ref 0–200)
HDL: 44 mg/dL (ref >39)
LDL Cholesterol: 117 mg/dL — ABNORMAL HIGH (ref 0–99)
Triglycerides: 166 mg/dL — ABNORMAL HIGH (ref <150)
VLDL: 33 mg/dL (ref 0–40)

## 2013-06-02 LAB — BASIC METABOLIC PANEL WITH GFR
BUN: 15 mg/dL (ref 6–23)
CO2: 25 mEq/L (ref 19–32)
Calcium: 9.8 mg/dL (ref 8.4–10.5)
Chloride: 104 mEq/L (ref 96–112)
Creat: 0.63 mg/dL (ref 0.50–1.10)
GFR, Est African American: >89 mL/min
GLUCOSE: 92 mg/dL (ref 70–99)
POTASSIUM: 4.2 meq/L (ref 3.5–5.3)
Sodium: 141 mEq/L (ref 135–145)

## 2013-06-02 LAB — MICROALBUMIN / CREATININE URINE RATIO
CREATININE, URINE: 14.3 mg/dL
MICROALB UR: 0.5 mg/dL (ref 0.00–1.89)
Microalb Creat Ratio: 35 mg/g — ABNORMAL HIGH (ref 0.0–30.0)

## 2013-06-02 LAB — TSH: TSH: 1.984 u[IU]/mL (ref 0.350–4.500)

## 2013-06-02 LAB — VITAMIN D 25 HYDROXY (VIT D DEFICIENCY, FRACTURES): VIT D 25 HYDROXY: 53 ng/mL (ref 30–89)

## 2013-06-02 LAB — C-REACTIVE PROTEIN: CRP: <0.5 mg/dL (ref <0.60)

## 2013-06-02 LAB — MAGNESIUM: MAGNESIUM: 1.8 mg/dL (ref 1.5–2.5)

## 2013-06-02 LAB — INSULIN, FASTING: INSULIN FASTING, SERUM: 27 u[IU]/mL (ref 3–28)

## 2013-06-04 ENCOUNTER — Telehealth: Payer: Self-pay | Admitting: Internal Medicine

## 2013-06-04 ENCOUNTER — Other Ambulatory Visit: Payer: Self-pay | Admitting: Emergency Medicine

## 2013-06-04 ENCOUNTER — Encounter: Payer: Self-pay | Admitting: Emergency Medicine

## 2013-06-04 DIAGNOSIS — I10 Essential (primary) hypertension: Secondary | ICD-10-CM | POA: Insufficient documentation

## 2013-06-04 DIAGNOSIS — K219 Gastro-esophageal reflux disease without esophagitis: Secondary | ICD-10-CM | POA: Insufficient documentation

## 2013-06-04 DIAGNOSIS — E785 Hyperlipidemia, unspecified: Secondary | ICD-10-CM | POA: Insufficient documentation

## 2013-06-04 DIAGNOSIS — K449 Diaphragmatic hernia without obstruction or gangrene: Secondary | ICD-10-CM | POA: Insufficient documentation

## 2013-06-04 LAB — TB SKIN TEST
INDURATION: 0 mm
TB Skin Test: NEGATIVE

## 2013-06-04 MED ORDER — FLUCONAZOLE 150 MG PO TABS: 150.0000 mg | ORAL_TABLET | ORAL | Status: DC

## 2013-06-04 NOTE — Telephone Encounter (Signed)
PT CALLED AND SAID HER RX FOR fluconazole (DIFLUCAN) 150 MG tablet [[id number]]  WAS SENT TO MAIL ORDER AND NOT LOCAL PHARM. PLEASE SEND TO LOCAL PHARMACY.

## 2013-06-05 ENCOUNTER — Encounter: Payer: Self-pay | Admitting: Emergency Medicine

## 2013-06-06 LAB — IRON AND TIBC
%SAT: 19 % — AB (ref 20–55)
IRON: 69 ug/dL (ref 42–145)
TIBC: 367 ug/dL (ref 250–470)
UIBC: 298 ug/dL (ref 125–400)

## 2013-07-15 DIAGNOSIS — M8580 Other specified disorders of bone density and structure, unspecified site: Secondary | ICD-10-CM

## 2013-09-13 ENCOUNTER — Ambulatory Visit: Payer: Self-pay | Admitting: Emergency Medicine

## 2013-09-13 ENCOUNTER — Ambulatory Visit: Payer: Self-pay | Admitting: Physician Assistant

## 2013-09-13 ENCOUNTER — Ambulatory Visit: Payer: Self-pay | Admitting: Internal Medicine

## 2013-10-24 ENCOUNTER — Ambulatory Visit (INDEPENDENT_AMBULATORY_CARE_PROVIDER_SITE_OTHER): Payer: BC Managed Care – PPO | Admitting: Emergency Medicine

## 2013-10-24 VITALS — BP 122/70 | HR 92 | Temp 98.5°F | Resp 18 | Ht 63.0 in | Wt 257.8 lb

## 2013-10-24 DIAGNOSIS — M5431 Sciatica, right side: Secondary | ICD-10-CM

## 2013-10-24 MED ORDER — CYCLOBENZAPRINE HCL 10 MG PO TABS: 10.0000 mg | ORAL_TABLET | Freq: Three times a day (TID) | ORAL | Status: DC | PRN

## 2013-10-24 MED ORDER — NAPROXEN SODIUM 550 MG PO TABS: 550.0000 mg | ORAL_TABLET | Freq: Two times a day (BID) | ORAL | Status: DC

## 2013-10-24 MED ORDER — HYDROCODONE-ACETAMINOPHEN 5-325 MG PO TABS: 1.0000 | ORAL_TABLET | ORAL | Status: DC | PRN
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
## 2013-10-24 NOTE — Patient Instructions (Signed)
Sciatica Sciatica is pain, weakness, numbness, or tingling along the path of the sciatic nerve. The nerve starts in the lower back and runs down the back of each leg. The nerve controls the muscles in the lower leg and in the back of the knee, while also providing sensation to the back of the thigh, lower leg, and the sole of your foot. Sciatica is a symptom of another medical condition. For instance, nerve damage or certain conditions, such as a herniated disk or bone spur on the spine, pinch or put pressure on the sciatic nerve. This causes the pain, weakness, or other sensations normally associated with sciatica. Generally, sciatica only affects one side of the body. CAUSES   Herniated or slipped disc.  Degenerative disk disease.  A pain disorder involving the narrow muscle in the buttocks (piriformis syndrome).  Pelvic injury or fracture.  Pregnancy.  Tumor (rare). SYMPTOMS  Symptoms can vary from mild to very severe. The symptoms usually travel from the low back to the buttocks and down the back of the leg. Symptoms can include:  Mild tingling or dull aches in the lower back, leg, or hip.  Numbness in the back of the calf or sole of the foot.  Burning sensations in the lower back, leg, or hip.  Sharp pains in the lower back, leg, or hip.  Leg weakness.  Severe back pain inhibiting movement. These symptoms may get worse with coughing, sneezing, laughing, or prolonged sitting or standing. Also, being overweight may worsen symptoms. DIAGNOSIS  Your caregiver will perform a physical exam to look for common symptoms of sciatica. He or she may ask you to do certain movements or activities that would trigger sciatic nerve pain. Other tests may be performed to find the cause of the sciatica. These may include:  Blood tests.  X-rays.  Imaging tests, such as an MRI or CT scan. TREATMENT  Treatment is directed at the cause of the sciatic pain. Sometimes, treatment is not necessary  and the pain and discomfort goes away on its own. If treatment is needed, your caregiver may suggest:  Over-the-counter medicines to relieve pain.  Prescription medicines, such as anti-inflammatory medicine, muscle relaxants, or narcotics.  Applying heat or ice to the painful area.  Steroid injections to lessen pain, irritation, and inflammation around the nerve.  Reducing activity during periods of pain.  Exercising and stretching to strengthen your abdomen and improve flexibility of your spine. Your caregiver may suggest losing weight if the extra weight makes the back pain worse.  Physical therapy.  Surgery to eliminate what is pressing or pinching the nerve, such as a bone spur or part of a herniated disk. HOME CARE INSTRUCTIONS   Only take over-the-counter or prescription medicines for pain or discomfort as directed by your caregiver.  Apply ice to the affected area for 20 minutes, 3-4 times a day for the first 48-72 hours. Then try heat in the same way.  Exercise, stretch, or perform your usual activities if these do not aggravate your pain.  Attend physical therapy sessions as directed by your caregiver.  Keep all follow-up appointments as directed by your caregiver.  Do not wear high heels or shoes that do not provide proper support.  Check your mattress to see if it is too soft. A firm mattress may lessen your pain and discomfort. SEEK IMMEDIATE MEDICAL CARE IF:   You lose control of your bowel or bladder (incontinence).  You have increasing weakness in the lower back, pelvis, buttocks,   or legs.  You have redness or swelling of your back.  You have a burning sensation when you urinate.  You have pain that gets worse when you lie down or awakens you at night.  Your pain is worse than you have experienced in the past.  Your pain is lasting longer than 4 weeks.  You are suddenly losing weight without reason. MAKE SURE YOU:  Understand these  instructions.  Will watch your condition.  Will get help right away if you are not doing well or get worse. Document Released: 12/15/2000 Document Revised: 06/22/2011 Document Reviewed: 05/02/2011 [employer] Patient Information 2015 [employer]. This information is not intended to replace advice given to you by your health care provider. Make sure you discuss any questions you have with your health care provider.  

## 2013-10-25 ENCOUNTER — Other Ambulatory Visit: Payer: Self-pay | Admitting: Orthopedic Surgery

## 2013-10-25 DIAGNOSIS — M25512 Pain in left shoulder: Secondary | ICD-10-CM

## 2013-10-25 DIAGNOSIS — M25511 Pain in right shoulder: Secondary | ICD-10-CM

## 2013-10-26 ENCOUNTER — Other Ambulatory Visit: Payer: BC Managed Care – PPO

## 2013-10-29 ENCOUNTER — Other Ambulatory Visit: Payer: BC Managed Care – PPO

## 2013-11-03 ENCOUNTER — Other Ambulatory Visit: Payer: Self-pay | Admitting: Internal Medicine

## 2013-11-07 ENCOUNTER — Other Ambulatory Visit: Payer: BC Managed Care – PPO

## 2013-11-07 ENCOUNTER — Ambulatory Visit (INDEPENDENT_AMBULATORY_CARE_PROVIDER_SITE_OTHER): Payer: BC Managed Care – PPO | Admitting: Physician Assistant

## 2013-11-07 ENCOUNTER — Encounter: Payer: Self-pay | Admitting: Physician Assistant

## 2013-11-07 VITALS — BP 138/78 | HR 88 | Temp 97.7°F | Resp 16 | Ht 63.0 in | Wt 254.0 lb

## 2013-11-07 DIAGNOSIS — M25562 Pain in left knee: Secondary | ICD-10-CM

## 2013-11-07 DIAGNOSIS — E559 Vitamin D deficiency, unspecified: Secondary | ICD-10-CM

## 2013-11-07 DIAGNOSIS — I10 Essential (primary) hypertension: Secondary | ICD-10-CM

## 2013-11-07 DIAGNOSIS — E785 Hyperlipidemia, unspecified: Secondary | ICD-10-CM

## 2013-11-07 DIAGNOSIS — Z79899 Other long term (current) drug therapy: Secondary | ICD-10-CM

## 2013-11-07 DIAGNOSIS — M25561 Pain in right knee: Secondary | ICD-10-CM

## 2013-11-07 DIAGNOSIS — E669 Obesity, unspecified: Secondary | ICD-10-CM

## 2013-11-07 DIAGNOSIS — R7303 Prediabetes: Secondary | ICD-10-CM

## 2013-11-07 DIAGNOSIS — R7309 Other abnormal glucose: Secondary | ICD-10-CM

## 2013-11-07 DIAGNOSIS — S76912A Strain of unspecified muscles, fascia and tendons at thigh level, left thigh, initial encounter: Secondary | ICD-10-CM

## 2013-11-07 DIAGNOSIS — S76012A Strain of muscle, fascia and tendon of left hip, initial encounter: Secondary | ICD-10-CM

## 2013-11-07 LAB — CBC WITH DIFFERENTIAL/PLATELET
Basophils Absolute: 0 10*3/uL (ref 0.0–0.1)
Basophils Relative: 0 % (ref 0–1)
Eosinophils Absolute: 0.3 10*3/uL (ref 0.0–0.7)
Eosinophils Relative: 3 % (ref 0–5)
HEMATOCRIT: 39.1 % (ref 36.0–46.0)
HEMOGLOBIN: 13.2 g/dL (ref 12.0–15.0)
LYMPHS PCT: 19 % (ref 12–46)
Lymphs Abs: 1.7 10*3/uL (ref 0.7–4.0)
MCH: 29.2 pg (ref 26.0–34.0)
MCHC: 33.8 g/dL (ref 30.0–36.0)
MCV: 86.5 fL (ref 78.0–100.0)
MONO ABS: 0.6 10*3/uL (ref 0.1–1.0)
MONOS PCT: 6 % (ref 3–12)
Neutro Abs: 6.6 10*3/uL (ref 1.7–7.7)
Neutrophils Relative %: 72 % (ref 43–77)
Platelets: 280 10*3/uL (ref 150–400)
RBC: 4.52 MIL/uL (ref 3.87–5.11)
RDW: 14.3 % (ref 11.5–15.5)
WBC: 9.2 10*3/uL (ref 4.0–10.5)

## 2013-11-07 LAB — TSH: TSH: 1.955 u[IU]/mL (ref 0.350–4.500)

## 2013-11-07 LAB — LIPID PANEL
Cholesterol: 159 mg/dL (ref 0–200)
HDL: 43 mg/dL (ref >39)
LDL CALC: 82 mg/dL (ref 0–99)
Total CHOL/HDL Ratio: 3.7 Ratio
Triglycerides: 169 mg/dL — ABNORMAL HIGH (ref <150)
VLDL: 34 mg/dL (ref 0–40)

## 2013-11-07 LAB — HEPATIC FUNCTION PANEL
ALBUMIN: 4.4 g/dL (ref 3.5–5.2)
ALK PHOS: 72 U/L (ref 39–117)
ALT: 23 U/L (ref 0–35)
AST: 21 U/L (ref 0–37)
BILIRUBIN INDIRECT: 0.4 mg/dL (ref 0.2–1.2)
Bilirubin, Direct: 0.1 mg/dL (ref 0.0–0.3)
TOTAL PROTEIN: 7.3 g/dL (ref 6.0–8.3)
Total Bilirubin: 0.5 mg/dL (ref 0.2–1.2)

## 2013-11-07 LAB — BASIC METABOLIC PANEL WITH GFR
BUN: 17 mg/dL (ref 6–23)
CHLORIDE: 103 meq/L (ref 96–112)
CO2: 27 mEq/L (ref 19–32)
CREATININE: 0.87 mg/dL (ref 0.50–1.10)
Calcium: 9.7 mg/dL (ref 8.4–10.5)
GFR, EST NON AFRICAN AMERICAN: 75 mL/min
GFR, Est African American: 86 mL/min
GLUCOSE: 85 mg/dL (ref 70–99)
Potassium: 4.3 mEq/L (ref 3.5–5.3)
Sodium: 139 mEq/L (ref 135–145)

## 2013-11-07 LAB — MAGNESIUM: MAGNESIUM: 1.9 mg/dL (ref 1.5–2.5)

## 2013-11-07 MED ORDER — CYCLOBENZAPRINE HCL 10 MG PO TABS: 10.0000 mg | ORAL_TABLET | Freq: Three times a day (TID) | ORAL | Status: DC | PRN

## 2013-11-07 MED ORDER — VALSARTAN-HYDROCHLOROTHIAZIDE 320-12.5 MG PO TABS: 1.0000 | ORAL_TABLET | Freq: Every day | ORAL | Status: DC

## 2013-11-07 MED ORDER — LANSOPRAZOLE 30 MG PO CPDR: 30.0000 mg | DELAYED_RELEASE_CAPSULE | Freq: Every day | ORAL | Status: DC

## 2013-11-07 MED ORDER — MELOXICAM 15 MG PO TABS: 15.0000 mg | ORAL_TABLET | Freq: Every day | ORAL | Status: DC

## 2013-11-07 NOTE — Patient Instructions (Signed)
Quadriceps Strain with Rehab A strain is a tear in a muscle or the tendon that attaches the muscle to bone. A quadriceps strain is a tear in the muscles on the front of the thigh (quadriceps muscles) or their tendons. The quadriceps muscles are important for straightening the knee and bending the hip. The condition is characterized by pain, inflammation, and reduced function of these muscles. Strains are classified into three categories. Grade 1 strains cause pain, but the tendon is not lengthened. Grade 2 strains include a lengthened ligament due to the ligament being stretched or partially ruptured. With grade 2 strains there is still function, although the function may be diminished. Grade 3 strains are characterized by a complete tear of the tendon or muscle, and function is usually impaired.  SYMPTOMS   Pain, tenderness, inflammation, and/or bruising (contusion) over the quadriceps muscles  Pain that worsens with use of the quadriceps muscles.  Muscle spasm in the thigh.  Difficulty with common tasks that involve the quadriceps muscle, such as walking.  A crackling sound (crepitation) when the tendon is moved or touched.  Loss of fullness of the muscle or bulging within the area of muscle with complete rupture. CAUSES  A strain occurs when a force is placed on the muscle or tendon that is greater than it can withstand. Common mechanisms of injury include:  Repetitive strenuous use of the quadriceps muscles. This may be due to an increase in the intensity, frequency, or duration of exercise.  Direct trauma to the quadriceps muscles or tendons. RISK INCREASES WITH:  Activities that involve forceful contractions of the quadriceps muscles (jumping or sprinting).  Contact sports (soccer or football).  Poor strength and flexibility.  Failure to warm-up properly before activity.  Previous injury to the thigh or knee. PREVENTION  Warm up and stretch properly before activity.  Allow  for adequate recovery between workouts.  Maintain physical fitness:  Strength, flexibility, and endurance.  Cardiovascular fitness.  Wear properly fitted and padded protective equipment. PROGNOSIS  If treated properly, then quadriceps muscles strains are usually curable within 6 weeks.  RELATED COMPLICATIONS   Prolonged healing time, if improperly treated or re-injured.  Recurrent symptoms that result in a chronic problem.  Recurrence of symptoms if activity is resumed too soon. TREATMENT  Treatment initially involves the use of ice and medication to help reduce pain and inflammation. The use of strengthening and stretching exercises may help reduce pain with activity. These exercises may be performed at home or with referral to a therapist. Crutches may be recommended to allow the muscle to rest until walking can be completed without limping. Surgery is rarely necessary for this injury, but may be considered if the injury involves a grade 3 strain, or if symptoms persist for greater than 3 months despite non-surgical (conservative) treatment.  MEDICATION  If pain medication is necessary, then nonsteroidal anti-inflammatory medications, such as aspirin and ibuprofen, or other minor pain relievers, such as acetaminophen, are often recommended.  Do not take pain medication for 7 days before surgery.  Prescription pain relievers may be given if deemed necessary by your caregiver. Use only as directed and only as much as you need.  Ointments applied to the skin may be helpful.  Corticosteroid injections may be given by your caregiver. These injections should be reserved for the most serious cases, because they may only be given a certain number of times. HEAT AND COLD  Cold treatment (icing) relieves pain and reduces inflammation. Cold treatment should be  applied for 10 to 15 minutes every 2 to 3 hours for inflammation and pain and immediately after any activity that aggravates your  symptoms. Use ice packs or massage the area with a piece of ice (ice massage).  Heat treatment may be used prior to performing the stretching and strengthening activities prescribed by your caregiver, physical therapist, or athletic trainer. Use a heat pack or soak the injury in warm water. SEEK MEDICAL CARE IF:  Treatment seems to offer no benefit, or the condition worsens.  Any medications produce adverse side effects. EXERCISES  RANGE OF MOTION (ROM) AND STRETCHING EXERCISES - Quadriceps Strain These exercises may help you when beginning to rehabilitate your injury. Your symptoms may resolve with or without further involvement from your physician, physical therapist or athletic trainer. While completing these exercises, remember:   Restoring tissue flexibility helps normal motion to return to the joints. This allows healthier, less painful movement and activity.  An effective stretch should be held for at least 30 seconds.  A stretch should never be painful. You should only feel a gentle lengthening or release in the stretched tissue. RANGE OF MOTION - Knee Flexion, Active  Lie on your back with both knees straight. (If this causes back discomfort, bend your opposite knee, placing your foot flat on the floor.)  Slowly slide your heel back toward your buttocks until you feel a gentle stretch in the front of your knee or thigh.  Hold for __________ seconds. Slowly slide your heel back to the starting position. Repeat __________ times. Complete this exercise __________ times per day.  STRETCH - Quadriceps, Prone  Lie on your stomach on a firm surface, such as a bed or padded floor.  Bend your right / left knee and grasp your ankle. If you are unable to reach, your ankle or pant leg, use a belt around your foot to lengthen your reach.  Gently pull your heel toward your buttocks. Your knee should not slide out to the side. You should feel a stretch in the front of your thigh and/or  knee.  Hold this position for __________ seconds. Repeat __________ times. Complete this stretch __________ times per day.  STRETCHING - Hip Flexors, Lunge  Half kneel with your right / left knee on the floor and your opposite knee bent and directly over your ankle.  Keep good posture with your head over your shoulders. Tighten your buttocks to point your tailbone downward; this will prevent your back from arching too much.  You should feel a gentle stretch in the front of your thigh and/or hip. If you do not feel any resistance, slightly slide your opposite foot forward and then slowly lunge forward so your knee once again lines up over your ankle. Be sure your tailbone remains pointed downward.  Hold this stretch for __________ seconds. Repeat __________ times. Complete this stretch __________ times per day. STRENGTHENING EXERCISES - Quadriceps Strain These exercises may help you when beginning to rehabilitate your injury. They may resolve your symptoms with or without further involvement from your physician, physical therapist or athletic trainer. While completing these exercises, remember:   Muscles can gain both the endurance and the strength needed for everyday activities through controlled exercises.  Complete these exercises as instructed by your physician, physical therapist or athletic trainer. Progress the resistance and repetitions only as guided. STRENGTH - Quadriceps, Isometrics  Lie on your back with your right / left leg extended and your opposite knee bent.  Gradually tense the muscles  in the front of your right / left thigh. You should see either your knee cap slide up toward your hip or increased dimpling just above the knee. This motion will push the back of the knee down toward the floor/mat/bed on which you are lying.  Hold the muscle as tight as you can without increasing your pain for __________ seconds.  Relax the muscles slowly and completely in between each  repetition. Repeat __________ times. Complete this exercise __________ times per day.  STRENGTH - Quadriceps, Short Arcs   Lie on your back. Place a __________ inch towel roll under your knee so that the knee slightly bends.  Raise only your lower leg by tightening the muscles in the front of your thigh. Do not allow your thigh to rise.  Hold this position for __________ seconds. Repeat __________ times. Complete this exercise __________ times per day.  OPTIONAL ANKLE WEIGHTS: Begin with ____________________, but DO NOT exceed ____________________. Increase in1 lb/0.5 kg increments. STRENGTH - Quadriceps, Straight Leg Raises  Quality counts! Watch for signs that the quadriceps muscle is working to insure you are strengthening the correct muscles and not "cheating" by substituting with healthier muscles.  Lay on your back with your right / left leg extended and your opposite knee bent.  Tense the muscles in the front of your right / left thigh. You should see either your knee cap slide up or increased dimpling just above the knee. Your thigh may even quiver.  Tighten these muscles even more and raise your leg 4 to 6 inches off the floor. Hold for __________ seconds.  Keeping these muscles tense, lower your leg.  Relax the muscles slowly and completely in between each repetition. Repeat __________ times. Complete this exercise __________ times per day.  STRENGTH - Quadriceps, Wall Slides  Follow guidelines for form closely. Increased knee pain often results from poorly placed feet or knees.  Lean against a smooth wall or door and walk your feet out 18-24 inches. Place your feet hip-width apart.  Slowly slide down the wall or door until your knees bend __________ degrees.* Keep your knees over your heels, not your toes, and in line with your hips, not falling to either side.  Hold for __________ seconds. Stand up to rest for __________ seconds in between each repetition. Repeat  __________ times. Complete this exercise __________ times per day. * Your physician, physical therapist or athletic trainer will alter this angle based on your symptoms and progress. STRENGTH - Quadriceps, Step-Ups   Use a thick book, step or step stool that is __________ inches tall.  Holding a wall or counter for balance only, not support.  Slowly step-up with your right / left foot, keeping your knee in line with your hip and foot. Do not allow your knee to bend so far that you cannot see your toes.  Slowly unlock your knee and lower yourself to the starting position. Your muscles, not gravity, should lower you. Repeat __________ times. Complete this exercise __________ times per day. Document Released: 12/21/2004 Document Revised: 03/15/2011 Document Reviewed: 04/04/2008 [facility] Patient Information 2015 [place], [place]. This information is not intended to replace advice given to you by your health care provider. Make sure you discuss any questions you have with your health care provider.

## 2013-11-07 NOTE — Progress Notes (Addendum)
Assessment and Plan:  Hypertension: Continue medication, monitor blood pressure at home. Continue DASH diet.  Reminder to go to the ER if any CP, SOB, nausea, dizziness, severe HA, changes vision/speech, left arm numbness and tingling, and jaw pain. Cholesterol: Continue diet and exercise. Check cholesterol.  Pre-diabetes-Continue diet and exercise. Check A1C Vitamin D Def- check level and continue medications.  Right thigh strain- ? Right knee MCL tear- RICE, mobic 15mg , cont flexeril, alternate ice/heat, get compression sleve, follow up with ortho, check labs get Ddimer rule out DVT   Continue diet and meds as discussed. Further disposition pending results of labs.  Addendum: Will get ultrasound right leg to rule out DVT, Ddimer +  HPI 58 y.o. female  presents for 3 month follow up with hypertension, hyperlipidemia, prediabetes and vitamin D. Her blood pressure has been controlled at home, today their BP is BP: 138/78 mmHg She does not workout. She denies chest pain, shortness of breath, dizziness.  She is not on cholesterol medication and denies myalgias. Her cholesterol is not at goal. The cholesterol last visit was:   Lab Results  Component Value Date   CHOL 194 06/01/2013   HDL 44 06/01/2013   LDLCALC 117* 06/01/2013   TRIG 166* 06/01/2013   CHOLHDL 4.4 06/01/2013   She has been working on diet and exercise for prediabetes, and denies paresthesia of the feet, polydipsia, polyuria and visual disturbances. Last A1C in the office was:  Lab Results  Component Value Date   HGBA1C 5.7* 06/01/2013   Patient is on Vitamin D supplement.   Lab Results  Component Value Date   VD25OH 53 06/01/2013     BMI is Body mass index is 45.01 kg/(m^2)., she is working on diet and exercise and has done well.  Wt Readings from Last 3 Encounters:  11/07/13 254 lb (115.214 kg)  10/24/13 257 lb 12.8 oz (116.937 kg)  06/01/13 255 lb (115.667 kg)   Follows with Dr. Tonita Cong, going to get bilateral  total shoulder replacements She states for the past 3-4 weeks she has been having right thigh pain. She remembers rolling over in bed 3-4 weeks ago and have a sharp pain in medial right thigh pain radiation down to her medial knee. She can feel a pulling, she has been taking flexeril which helps some, Norco at night, and mobic 7.5mg . It has been getting better but then she will feel the pain again at night. Worse with steps.  She works 12 hour shifts and is on her feet the entire time. Denies bruises, numbness/tinlging in leg,   Current Medications:  Current Outpatient Prescriptions on File Prior to Visit  Medication Sig Dispense Refill  . Cholecalciferol (D 5000 PO) Take by mouth.    . cyclobenzaprine (FLEXERIL) 10 MG tablet Take 1 tablet (10 mg total) by mouth 3 (three) times daily as needed for muscle spasms. 30 tablet 0  . fluconazole (DIFLUCAN) 150 MG tablet Take 1 tablet (150 mg total) by mouth once a week. 2 tablet 1  . guaifenesin (HUMIBID E) 400 MG TABS tablet Take 400 mg by mouth every 4 (four) hours.    Marland Kitchen HYDROcodone-acetaminophen (NORCO) 5-325 MG per tablet Take 1-2 tablets by mouth every 4 (four) hours as needed. 30 tablet 0  . Krill Oil 300 MG CAPS Take by mouth.    . lansoprazole (PREVACID) 30 MG capsule Take 1 capsule (30 mg total) by mouth daily. 90 capsule 3  . magnesium gluconate (MAGONATE) 500 MG tablet Take 500  mg by mouth daily.    . meloxicam (MOBIC) 7.5 MG tablet Take 1 tablet (7.5 mg total) by mouth daily. 90 tablet 1  . naproxen sodium (ANAPROX DS) 550 MG tablet Take 1 tablet (550 mg total) by mouth 2 (two) times daily with a meal. 40 tablet 0  . valsartan-hydrochlorothiazide (DIOVAN-HCT) 320-12.5 MG per tablet Take 1 tablet by mouth daily. 90 tablet 1   No current facility-administered medications on file prior to visit.   Medical History:  Past Medical History  Diagnosis Date  . Environmental allergies   . Arthritis   . Asthma   . Hypertension   . Hyperlipidemia    . GERD (gastroesophageal reflux disease)   . Hiatal hernia     with stricture dilation  . Kidney stones 2008  . Obesity, unspecified    Allergies:  Allergies  Allergen Reactions  . Codeine     REACTION: nausea/vomiting     Review of Systems: [X]  = complains of  [ ]  = denies  General: Fatigue [ ]  Fever [ ]  Chills [ ]  Weakness [ ]   Insomnia [ ]  Eyes: Redness [ ]  Blurred vision [ ]  Diplopia [ ]   ENT: Congestion [ ]  Sinus Pain [ ]  Post Nasal Drip [ ]  Sore Throat [ ]  Earache [ ]   Cardiac: Chest pain/pressure [ ]  SOB [ ]  Orthopnea [ ]   Palpitations [ ]   Paroxysmal nocturnal dyspnea[ ]  Claudication [ ]  Edema [ ]   Pulmonary: Cough [ ]  Wheezing[ ]   SOB [ ]   Snoring [ ]   GI: Nausea [ ]  Vomiting[ ]  Dysphagia[ ]  Heartburn[ ]  Abdominal pain [ ]  Constipation [ ] ; Diarrhea [ ] ; BRBPR [ ]  Melena[ ]  GU: Hematuria[ ]  Dysuria [ ]  Nocturia[ ]  Urgency [ ]   Hesitancy [ ]  Discharge [ ]  Neuro: Headaches[ ]  Vertigo[ ]  Paresthesias[ ]  Spasm [ ]  Speech changes [ ]  Incoordination [ ]   Ortho: Arthritis [x ] Joint pain [ ]  Muscle pain [x ] Joint swelling [ ]  Back Pain [ ]  Skin:  Rash [ ]   Pruritis [ ]  Change in skin lesion [ ]   Psych: Depression[ ]  Anxiety[ ]  Confusion [ ]  Memory loss [ ]   Heme/Lypmh: Bleeding [ ]  Bruising [ ]  Enlarged lymph nodes [ ]   Endocrine: Visual blurring [ ]  Paresthesia [ ]  Polyuria [ ]  Polydypsea [ ]    Heat/cold intolerance [ ]  Hypoglycemia [ ]   Family history- Review and unchanged Social history- Review and unchanged Physical Exam: BP 138/78 mmHg  Pulse 88  Temp(Src) 97.7 F (36.5 C)  Resp 16  Ht 5\' 3"  (1.6 m)  Wt 254 lb (115.214 kg)  BMI 45.01 kg/m2  LMP 10/18/2013 Wt Readings from Last 3 Encounters:  11/07/13 254 lb (115.214 kg)  10/24/13 257 lb 12.8 oz (116.937 kg)  06/01/13 255 lb (115.667 kg)   General Appearance: Well nourished, in no apparent distress. Eyes: PERRLA, EOMs, conjunctiva no swelling or erythema Sinuses: No Frontal/maxillary tenderness ENT/Mouth:  Ext aud canals clear, TMs without erythema, bulging. No erythema, swelling, or exudate on post pharynx.  Tonsils not swollen or erythematous. Hearing normal.  Neck: Supple, thyroid normal.  Respiratory: Respiratory effort normal, BS equal bilaterally without rales, rhonchi, wheezing or stridor.  Cardio: RRR with no MRGs. Brisk peripheral pulses without edema.  Abdomen: Soft, + BS, obese  Non tender, no guarding, rebound, hernias, masses. Lymphatics: Non tender without lymphadenopathy.  Musculoskeletal: Full ROM, 5/5 strength, gait antalgic, no ecchymosis, swelling, erythema, + tenderness at medial collateral  ligament and patellar tendon, + tenderness medial thigh.  Skin: Warm, dry without rashes, lesions, ecchymosis.  Neuro: Cranial nerves intact. Normal muscle tone, no cerebellar symptoms. Sensation intact.  Psych: Awake and oriented X 3, normal affect, Insight and Judgment appropriate.    Vicie Mutters, PA-C 1:40 PM [facility]

## 2013-11-08 ENCOUNTER — Encounter: Payer: Self-pay | Admitting: Physician Assistant

## 2013-11-08 ENCOUNTER — Other Ambulatory Visit: Payer: Self-pay | Admitting: Endodontics

## 2013-11-08 ENCOUNTER — Other Ambulatory Visit: Payer: Self-pay | Admitting: Emergency Medicine

## 2013-11-08 ENCOUNTER — Encounter: Payer: Self-pay | Admitting: Internal Medicine

## 2013-11-08 LAB — HEMOGLOBIN A1C
HEMOGLOBIN A1C: 5.6 % (ref <5.7)
MEAN PLASMA GLUCOSE: 114 mg/dL (ref <117)

## 2013-11-08 LAB — VITAMIN D 25 HYDROXY (VIT D DEFICIENCY, FRACTURES): Vit D, 25-Hydroxy: 53 ng/mL (ref 30–89)

## 2013-11-08 LAB — D-DIMER, QUANTITATIVE: D-Dimer, Quant: 0.91 ug/mL-FEU — ABNORMAL HIGH (ref 0.00–0.48)

## 2013-11-08 NOTE — Addendum Note (Signed)
Addended by: Vicie Mutters R on: 11/08/2013 08:57 AM   Modules accepted: Orders

## 2013-11-12 ENCOUNTER — Encounter: Payer: Self-pay | Admitting: Physician Assistant

## 2013-11-12 ENCOUNTER — Ambulatory Visit: Payer: Self-pay | Admitting: Physician Assistant

## 2013-11-12 ENCOUNTER — Ambulatory Visit
Admission: RE | Admit: 2013-11-12 | Discharge: 2013-11-12 | Disposition: A | Discharge status: Home / Self Care | Payer: BC Managed Care – PPO | Source: Ambulatory Visit | Attending: Orthopedic Surgery | Admitting: Orthopedic Surgery

## 2013-11-12 ENCOUNTER — Ambulatory Visit
Admission: RE | Admit: 2013-11-12 | Discharge: 2013-11-12 | Disposition: A | Discharge status: Home / Self Care | Payer: BC Managed Care – PPO | Source: Ambulatory Visit | Attending: Physician Assistant | Admitting: Physician Assistant

## 2013-11-12 DIAGNOSIS — M25512 Pain in left shoulder: Secondary | ICD-10-CM

## 2013-11-12 DIAGNOSIS — M25511 Pain in right shoulder: Secondary | ICD-10-CM

## 2013-11-12 DIAGNOSIS — M25561 Pain in right knee: Secondary | ICD-10-CM

## 2013-11-13 ENCOUNTER — Other Ambulatory Visit: Payer: BC Managed Care – PPO

## 2013-11-26 ENCOUNTER — Other Ambulatory Visit: Payer: Self-pay | Admitting: Orthopedic Surgery

## 2013-11-26 DIAGNOSIS — M25551 Pain in right hip: Secondary | ICD-10-CM

## 2014-01-08 ENCOUNTER — Encounter: Payer: Self-pay | Admitting: Internal Medicine

## 2014-01-28 ENCOUNTER — Other Ambulatory Visit: Payer: Self-pay | Admitting: Orthopedic Surgery

## 2014-01-28 DIAGNOSIS — M545 Low back pain, unspecified: Secondary | ICD-10-CM

## 2014-01-28 DIAGNOSIS — M5416 Radiculopathy, lumbar region: Secondary | ICD-10-CM

## 2014-01-31 ENCOUNTER — Ambulatory Visit
Admission: RE | Admit: 2014-01-31 | Discharge: 2014-01-31 | Disposition: A | Discharge status: Home / Self Care | Payer: 59 | Source: Ambulatory Visit | Attending: Orthopedic Surgery | Admitting: Orthopedic Surgery

## 2014-01-31 DIAGNOSIS — M545 Low back pain, unspecified: Secondary | ICD-10-CM

## 2014-01-31 DIAGNOSIS — M5416 Radiculopathy, lumbar region: Secondary | ICD-10-CM

## 2014-02-04 ENCOUNTER — Other Ambulatory Visit: Payer: Self-pay

## 2014-02-08 ENCOUNTER — Other Ambulatory Visit: Payer: Self-pay

## 2014-02-08 MED ORDER — CYCLOBENZAPRINE HCL 10 MG PO TABS: 10.0000 mg | ORAL_TABLET | Freq: Three times a day (TID) | ORAL | Status: DC | PRN

## 2014-02-19 ENCOUNTER — Other Ambulatory Visit (HOSPITAL_COMMUNITY): Payer: Self-pay | Admitting: Orthopedic Surgery

## 2014-03-06 ENCOUNTER — Encounter (HOSPITAL_COMMUNITY)
Admission: RE | Admit: 2014-03-06 | Discharge: 2014-03-06 | Disposition: A | Discharge status: Home / Self Care | Payer: 59 | Source: Ambulatory Visit | Attending: Orthopedic Surgery | Admitting: Orthopedic Surgery

## 2014-03-06 ENCOUNTER — Encounter (HOSPITAL_COMMUNITY): Payer: Self-pay

## 2014-03-06 DIAGNOSIS — Z01818 Encounter for other preprocedural examination: Secondary | ICD-10-CM

## 2014-03-06 DIAGNOSIS — M1611 Unilateral primary osteoarthritis, right hip: Secondary | ICD-10-CM | POA: Insufficient documentation

## 2014-03-06 DIAGNOSIS — Z0183 Encounter for blood typing: Secondary | ICD-10-CM | POA: Insufficient documentation

## 2014-03-06 DIAGNOSIS — Z01812 Encounter for preprocedural laboratory examination: Secondary | ICD-10-CM | POA: Insufficient documentation

## 2014-03-06 HISTORY — DX: Other specified postprocedural states: Z98.890

## 2014-03-06 HISTORY — DX: Nausea with vomiting, unspecified: R11.2

## 2014-03-06 LAB — CBC
HCT: 40.3 % (ref 36.0–46.0)
HEMOGLOBIN: 13.4 g/dL (ref 12.0–15.0)
MCH: 30.4 pg (ref 26.0–34.0)
MCHC: 33.3 g/dL (ref 30.0–36.0)
MCV: 91.4 fL (ref 78.0–100.0)
Platelets: 277 10*3/uL (ref 150–400)
RBC: 4.41 MIL/uL (ref 3.87–5.11)
RDW: 12.7 % (ref 11.5–15.5)
WBC: 10.6 10*3/uL — ABNORMAL HIGH (ref 4.0–10.5)

## 2014-03-06 LAB — BASIC METABOLIC PANEL
Anion gap: 11 (ref 5–15)
BUN: 29 mg/dL — ABNORMAL HIGH (ref 6–23)
CALCIUM: 9.7 mg/dL (ref 8.4–10.5)
CHLORIDE: 107 mmol/L (ref 96–112)
CO2: 21 mmol/L (ref 19–32)
CREATININE: 0.8 mg/dL (ref 0.50–1.10)
GFR calc Af Amer: >90 mL/min (ref >90)
GFR calc non Af Amer: 81 mL/min — ABNORMAL LOW (ref >90)
GLUCOSE: 107 mg/dL — AB (ref 70–99)
Potassium: 3.8 mmol/L (ref 3.5–5.1)
SODIUM: 139 mmol/L (ref 135–145)

## 2014-03-06 LAB — SURGICAL PCR SCREEN
MRSA, PCR: NEGATIVE
Staphylococcus aureus: NEGATIVE

## 2014-03-06 LAB — PROTIME-INR
INR: 1.07 (ref 0.00–1.49)
Prothrombin Time: 14 seconds (ref 11.6–15.2)

## 2014-03-06 LAB — ABO/RH: ABO/RH(D): A POS

## 2014-03-06 LAB — TYPE AND SCREEN
ABO/RH(D): A POS
Antibody Screen: NEGATIVE

## 2014-03-06 LAB — APTT: aPTT: 26 seconds (ref 24–37)

## 2014-03-06 MED ORDER — CHLORHEXIDINE GLUCONATE 4 % EX LIQD: 60.0000 mL | Freq: Once | CUTANEOUS | Status: DC

## 2014-03-06 MED ORDER — CHLORHEXIDINE GLUCONATE 4 % EX LIQD
60.0000 mL | Freq: Once | CUTANEOUS | Status: DC
Start: 2014-03-06 — End: 2014-03-07

## 2014-03-06 NOTE — Pre-Procedure Instructions (Signed)
Christine [name]  03/06/2014   Your procedure is scheduled on:  March 19, 2014  Report to [facility] Admitting at 8 AM.  Call this number if you have problems the morning of surgery: [phone]   Remember:   Do not eat food or drink liquids after midnight.   Take these medicines the morning of surgery with A SIP OF WATER: cyclobenzaprine (FLEXERIL) if needed, guaifenesin (HUMIBID E), pain pill if needed, lansoprazole (PREVACID)  STOP MOBIC(MELOXICAM), HERBAL SUPPLEMENTS, NSIADS(ADVIL, IBUPROFEN) ONE WEEK PRIOR TO SURGERY   Do not wear jewelry, make-up or nail polish.  Do not wear lotions, powders, or perfumes. You may wear deodorant.  Do not shave 48 hours prior to surgery. Men may shave face and neck.  Do not bring valuables to the hospital.  [facility] is not responsible                  for any belongings or valuables.               Contacts, dentures or bridgework may not be worn into surgery.  Leave suitcase in the car. After surgery it may be brought to your room.  For patients admitted to the hospital, discharge time is determined by your                treatment team.               Patients discharged the day of surgery will not be allowed to drive  home.  Name and phone number of your driver: FAMILY/FRIEND  Special Instructions:    Please read over the following fact sheets that you were given: Pain Booklet, Coughing and Deep Breathing, Blood Transfusion Information and Surgical Site Infection Prevention

## 2014-03-06 NOTE — Progress Notes (Signed)
Pt unable to void during PAT after several attempts.  Instructed to void and bring AM of surgery

## 2014-03-06 NOTE — Progress Notes (Signed)
This patient scored at an elevated risk for obstructive sleep apnea using the STOP BAND TOOL during a presurgical testing

## 2014-03-18 MED ORDER — CEFAZOLIN SODIUM-DEXTROSE 2-3 GM-% IV SOLR
2.0000 g | INTRAVENOUS | Status: AC
  Administered 2014-03-19: 2 g via INTRAVENOUS
  Filled 2014-03-18: qty 50

## 2014-03-18 NOTE — H&P (Signed)
TOTAL HIP ADMISSION H&P  Patient is admitted for right total hip arthroplasty.  Subjective:  Chief Complaint: right hip pain  HPI: Christine [name], 59 y.o. female, has a history of pain and functional disability in the right hip(s) due to arthritis and patient has failed non-surgical conservative treatments for greater than 12 weeks to include NSAID's and/or analgesics, corticosteriod injections, flexibility and strengthening excercises, use of assistive devices and activity modification.  Onset of symptoms was gradual starting 5 years ago with rapidlly worsening course since that time.The patient noted no past surgery on the right hip(s).  Patient currently rates pain in the right hip at 9 out of 10 with activity. Patient has night pain, worsening of pain with activity and weight bearing, trendelenberg gait, pain that interfers with activities of daily living, pain with passive range of motion, crepitus and joint swelling. Patient has evidence of subchondral cysts, subchondral sclerosis, periarticular osteophytes and joint space narrowing by imaging studies. This condition presents safety issues increasing the risk of falls. This patient has had Significant worsening of symptoms over the past year to the point where she cannot work. She also has known history of shoulder and knee arthritis however hip arthritis is the most severe and most incapacitating for her currently.  There is no current active infection.  Patient Active Problem List   Diagnosis Date Noted  . Hypertension   . Hyperlipidemia   . GERD (gastroesophageal reflux disease)   . Hiatal hernia   . Obesity    Past Medical History  Diagnosis Date  . Environmental allergies   . Arthritis   . Asthma   . Hypertension   . Hyperlipidemia   . GERD (gastroesophageal reflux disease)   . Hiatal hernia     with stricture dilation  . Kidney stones 2008  . Obesity, unspecified   . PONV (postoperative nausea and vomiting)     Past  Surgical History  Procedure Laterality Date  . Cholecystectomy  1987  . Esophageal dilation    . Knee arthroscopy Right 2007    No prescriptions prior to admission   Allergies  Allergen Reactions  . Codeine     REACTION: nausea/vomiting    History  Substance Use Topics  . Smoking status: Never Smoker   . Smokeless tobacco: Not on file  . Alcohol Use: No    Family History  Problem Relation Age of Onset  . Throat cancer Father   . Cancer Father     throat  . Stroke Mother   . Hypertension Mother   . Hyperlipidemia Mother   . Heart disease Mother      Review of Systems  Constitutional: Negative.   HENT: Negative.   Eyes: Negative.   Respiratory: Negative.   Cardiovascular: Negative.   Gastrointestinal: Negative.   Genitourinary: Negative.   Musculoskeletal: Positive for joint pain.  Skin: Negative.   Neurological: Negative.   Endo/Heme/Allergies: Negative.   Psychiatric/Behavioral: Negative.     Objective:  Physical Exam  Constitutional: She appears well-developed.  HENT:  Head: Normocephalic.  Eyes: Pupils are equal, round, and reactive to light.  Neck: Normal range of motion.  Cardiovascular: Normal rate.   Respiratory: Effort normal.  Neurological: She is alert.  Skin: Skin is warm.  Psychiatric: She has a normal mood and affect.   examination of the right hip demonstrates painful range of motion with internal extra rotation of the leg leg lengths approximately equal pedal pulses intact ankle dorsi flexion plantar flexion intact bilaterally patient does  have a pannus but the skin in the panel fold appears intact without erythema drainage or discharge.  Vital signs in last 24 hours:    Labs:   Estimated body mass index is 42.17 kg/(m^2) as calculated from the following:   Height as of 11/07/13: 5\' 3"  (1.6 m).   Weight as of 01/31/14: 107.956 kg (238 lb).   Imaging Review Plain radiographs demonstrate severe degenerative joint disease of the right  hip(s). The bone quality appears to be good for age and reported activity level.  Assessment/Plan:  End stage arthritis, right hip(s)  The patient history, physical examination, clinical judgement of the provider and imaging studies are consistent with end stage degenerative joint disease of the right hip(s) and total hip arthroplasty is deemed medically necessary. The treatment options including medical management, injection therapy, arthroscopy and arthroplasty were discussed at length. The risks and benefits of total hip arthroplasty were presented and reviewed. The risks due to aseptic loosening, infection, stiffness, dislocation/subluxation,  thromboembolic complications and other imponderables were discussed.  The patient acknowledged the explanation, agreed to proceed with the plan and consent was signed. Patient is being admitted for inpatient treatment for surgery, pain control, PT, OT, prophylactic antibiotics, VTE prophylaxis, progressive ambulation and ADL's and discharge planning.The patient is planning to be discharged home with home health services patient's skin within her pannus fold has been treated in order to minimize the risk for infection. Roy has been treating it with powder and local modalities to improve any type of excoriation in the pannus area.

## 2014-03-18 NOTE — Progress Notes (Signed)
Spoke to pt's sister, Domenic Moras, of time change. She stated someone had already called them and told them to come at 5:30 03/19/14.

## 2014-03-19 ENCOUNTER — Inpatient Hospital Stay (HOSPITAL_COMMUNITY): Payer: 59

## 2014-03-19 ENCOUNTER — Encounter (HOSPITAL_COMMUNITY)
Admission: RE | Disposition: A | Discharge status: Home Health | Payer: Self-pay | Source: Ambulatory Visit | Attending: Orthopedic Surgery

## 2014-03-19 ENCOUNTER — Inpatient Hospital Stay (HOSPITAL_COMMUNITY): Payer: 59 | Admitting: Anesthesiology

## 2014-03-19 ENCOUNTER — Inpatient Hospital Stay (HOSPITAL_COMMUNITY)
Admission: RE | Admit: 2014-03-19 | Discharge: 2014-03-21 | DRG: 470 | Disposition: A | Discharge status: Home Health | Payer: 59 | Source: Ambulatory Visit | Attending: Orthopedic Surgery | Admitting: Orthopedic Surgery

## 2014-03-19 ENCOUNTER — Inpatient Hospital Stay (HOSPITAL_COMMUNITY): Payer: 59 | Admitting: Emergency Medicine

## 2014-03-19 ENCOUNTER — Encounter (HOSPITAL_COMMUNITY): Payer: Self-pay | Admitting: *Deleted

## 2014-03-19 DIAGNOSIS — Z79899 Other long term (current) drug therapy: Secondary | ICD-10-CM | POA: Diagnosis not present

## 2014-03-19 DIAGNOSIS — Z8249 Family history of ischemic heart disease and other diseases of the circulatory system: Secondary | ICD-10-CM

## 2014-03-19 DIAGNOSIS — E669 Obesity, unspecified: Secondary | ICD-10-CM | POA: Diagnosis present

## 2014-03-19 DIAGNOSIS — E785 Hyperlipidemia, unspecified: Secondary | ICD-10-CM | POA: Diagnosis present

## 2014-03-19 DIAGNOSIS — Z6841 Body Mass Index (BMI) 40.0 and over, adult: Secondary | ICD-10-CM | POA: Diagnosis not present

## 2014-03-19 DIAGNOSIS — Z7901 Long term (current) use of anticoagulants: Secondary | ICD-10-CM | POA: Diagnosis not present

## 2014-03-19 DIAGNOSIS — M1611 Unilateral primary osteoarthritis, right hip: Secondary | ICD-10-CM | POA: Diagnosis present

## 2014-03-19 DIAGNOSIS — Z419 Encounter for procedure for purposes other than remedying health state, unspecified: Secondary | ICD-10-CM

## 2014-03-19 DIAGNOSIS — K219 Gastro-esophageal reflux disease without esophagitis: Secondary | ICD-10-CM | POA: Diagnosis present

## 2014-03-19 DIAGNOSIS — I10 Essential (primary) hypertension: Secondary | ICD-10-CM | POA: Diagnosis present

## 2014-03-19 DIAGNOSIS — M1610 Unilateral primary osteoarthritis, unspecified hip: Secondary | ICD-10-CM

## 2014-03-19 DIAGNOSIS — Z823 Family history of stroke: Secondary | ICD-10-CM | POA: Diagnosis not present

## 2014-03-19 DIAGNOSIS — M25551 Pain in right hip: Secondary | ICD-10-CM | POA: Diagnosis present

## 2014-03-19 HISTORY — PX: TOTAL HIP ARTHROPLASTY: SHX124

## 2014-03-19 LAB — URINE MICROSCOPIC-ADD ON

## 2014-03-19 LAB — URINALYSIS, ROUTINE W REFLEX MICROSCOPIC
BILIRUBIN URINE: NEGATIVE
Glucose, UA: NEGATIVE mg/dL
Hgb urine dipstick: NEGATIVE
Ketones, ur: NEGATIVE mg/dL
NITRITE: POSITIVE — AB
PH: 5.5 (ref 5.0–8.0)
Protein, ur: NEGATIVE mg/dL
Specific Gravity, Urine: 1.028 (ref 1.005–1.030)
UROBILINOGEN UA: 0.2 mg/dL (ref 0.0–1.0)

## 2014-03-19 SURGERY — ARTHROPLASTY, HIP, TOTAL, ANTERIOR APPROACH
Anesthesia: General | Site: Hip | Laterality: Right

## 2014-03-19 MED ORDER — PHENYLEPHRINE 40 MCG/ML (10ML) SYRINGE FOR IV PUSH (FOR BLOOD PRESSURE SUPPORT)
PREFILLED_SYRINGE | INTRAVENOUS | Status: AC
  Filled 2014-03-19: qty 10

## 2014-03-19 MED ORDER — ONDANSETRON HCL 4 MG/2ML IJ SOLN
4.0000 mg | Freq: Once | INTRAMUSCULAR | Status: AC | PRN
  Administered 2014-03-19: 4 mg via INTRAVENOUS

## 2014-03-19 MED ORDER — MIDAZOLAM HCL 2 MG/2ML IJ SOLN
INTRAMUSCULAR | Status: AC
  Filled 2014-03-19: qty 2

## 2014-03-19 MED ORDER — MENTHOL 3 MG MT LOZG
1.0000 | LOZENGE | OROMUCOSAL | Status: DC | PRN
  Filled 2014-03-19: qty 9

## 2014-03-19 MED ORDER — HYDROMORPHONE HCL 1 MG/ML IJ SOLN
INTRAMUSCULAR | Status: AC
  Filled 2014-03-19: qty 1

## 2014-03-19 MED ORDER — VITAMIN D 1000 UNITS PO TABS
1000.0000 [IU] | ORAL_TABLET | Freq: Every day | ORAL | Status: DC
  Administered 2014-03-20 – 2014-03-21 (×2): 1000 [IU] via ORAL
  Filled 2014-03-19 (×2): qty 1

## 2014-03-19 MED ORDER — RIVAROXABAN 10 MG PO TABS
10.0000 mg | ORAL_TABLET | Freq: Every day | ORAL | Status: DC
  Administered 2014-03-20 – 2014-03-21 (×2): 10 mg via ORAL
  Filled 2014-03-19 (×2): qty 1

## 2014-03-19 MED ORDER — METOCLOPRAMIDE HCL 5 MG/ML IJ SOLN: 5.0000 mg | Freq: Three times a day (TID) | INTRAMUSCULAR | Status: DC | PRN

## 2014-03-19 MED ORDER — PANTOPRAZOLE SODIUM 20 MG PO TBEC
20.0000 mg | DELAYED_RELEASE_TABLET | Freq: Every day | ORAL | Status: DC
  Administered 2014-03-20 – 2014-03-21 (×2): 20 mg via ORAL
  Filled 2014-03-19 (×2): qty 1

## 2014-03-19 MED ORDER — NAPROXEN SODIUM 550 MG PO TABS
550.0000 mg | ORAL_TABLET | Freq: Two times a day (BID) | ORAL | Status: DC
  Filled 2014-03-19 (×2): qty 1

## 2014-03-19 MED ORDER — METOCLOPRAMIDE HCL 10 MG PO TABS: 5.0000 mg | ORAL_TABLET | Freq: Three times a day (TID) | ORAL | Status: DC | PRN

## 2014-03-19 MED ORDER — LIDOCAINE HCL (CARDIAC) 20 MG/ML IV SOLN
INTRAVENOUS | Status: DC | PRN
  Administered 2014-03-19: 100 mg via INTRAVENOUS

## 2014-03-19 MED ORDER — ROCURONIUM BROMIDE 100 MG/10ML IV SOLN
INTRAVENOUS | Status: DC | PRN
  Administered 2014-03-19: 50 mg via INTRAVENOUS

## 2014-03-19 MED ORDER — MIDAZOLAM HCL 5 MG/5ML IJ SOLN
INTRAMUSCULAR | Status: DC | PRN
  Administered 2014-03-19 (×2): 1 mg via INTRAVENOUS

## 2014-03-19 MED ORDER — PROMETHAZINE HCL 25 MG/ML IJ SOLN
6.2500 mg | Freq: Four times a day (QID) | INTRAMUSCULAR | Status: AC | PRN
Start: 2014-03-19 — End: 2014-03-19
  Administered 2014-03-19: 6.25 mg via INTRAVENOUS

## 2014-03-19 MED ORDER — ARTIFICIAL TEARS OP OINT
TOPICAL_OINTMENT | OPHTHALMIC | Status: AC
  Filled 2014-03-19: qty 3.5

## 2014-03-19 MED ORDER — FENTANYL CITRATE 0.05 MG/ML IJ SOLN
INTRAMUSCULAR | Status: DC | PRN
Start: 2014-03-19 — End: 2014-03-19
  Administered 2014-03-19 (×4): 50 ug via INTRAVENOUS
  Administered 2014-03-19: 25 ug via INTRAVENOUS
  Administered 2014-03-19: 50 ug via INTRAVENOUS
  Administered 2014-03-19: 25 ug via INTRAVENOUS
  Administered 2014-03-19: 50 ug via INTRAVENOUS

## 2014-03-19 MED ORDER — GLYCOPYRROLATE 0.2 MG/ML IJ SOLN
INTRAMUSCULAR | Status: DC | PRN
  Administered 2014-03-19: 0.4 mg via INTRAVENOUS

## 2014-03-19 MED ORDER — SODIUM CHLORIDE 0.9 % IJ SOLN
INTRAMUSCULAR | Status: DC | PRN
  Administered 2014-03-19: 20 mL via INTRAVENOUS

## 2014-03-19 MED ORDER — ONDANSETRON HCL 4 MG/2ML IJ SOLN
INTRAMUSCULAR | Status: AC
Start: 2014-03-19 — End: 2014-03-19
  Filled 2014-03-19: qty 2

## 2014-03-19 MED ORDER — ONDANSETRON HCL 4 MG PO TABS: 4.0000 mg | ORAL_TABLET | Freq: Four times a day (QID) | ORAL | Status: DC | PRN

## 2014-03-19 MED ORDER — ARTIFICIAL TEARS OP OINT
TOPICAL_OINTMENT | OPHTHALMIC | Status: DC | PRN
Start: 2014-03-19 — End: 2014-03-19
  Administered 2014-03-19: 1 via OPHTHALMIC

## 2014-03-19 MED ORDER — MAGNESIUM GLUCONATE 500 MG PO TABS: 500.0000 mg | ORAL_TABLET | Freq: Two times a day (BID) | ORAL | Status: DC

## 2014-03-19 MED ORDER — MEPERIDINE HCL 25 MG/ML IJ SOLN: 6.2500 mg | INTRAMUSCULAR | Status: DC | PRN

## 2014-03-19 MED ORDER — ONDANSETRON HCL 4 MG/2ML IJ SOLN
INTRAMUSCULAR | Status: AC
  Filled 2014-03-19: qty 2

## 2014-03-19 MED ORDER — TRAMADOL HCL 50 MG PO TABS: 50.0000 mg | ORAL_TABLET | Freq: Four times a day (QID) | ORAL | Status: DC | PRN

## 2014-03-19 MED ORDER — FENTANYL CITRATE 0.05 MG/ML IJ SOLN
INTRAMUSCULAR | Status: AC
  Filled 2014-03-19: qty 5

## 2014-03-19 MED ORDER — LACTATED RINGERS IV SOLN
INTRAVENOUS | Status: DC | PRN
  Administered 2014-03-19 (×2): via INTRAVENOUS

## 2014-03-19 MED ORDER — PROPOFOL 10 MG/ML IV BOLUS
INTRAVENOUS | Status: AC
  Filled 2014-03-19: qty 20

## 2014-03-19 MED ORDER — PROPOFOL 10 MG/ML IV BOLUS
INTRAVENOUS | Status: DC | PRN
  Administered 2014-03-19: 200 mg via INTRAVENOUS

## 2014-03-19 MED ORDER — SUCCINYLCHOLINE CHLORIDE 20 MG/ML IJ SOLN
INTRAMUSCULAR | Status: AC
  Filled 2014-03-19: qty 1

## 2014-03-19 MED ORDER — EPHEDRINE SULFATE 50 MG/ML IJ SOLN
INTRAMUSCULAR | Status: AC
  Filled 2014-03-19: qty 1

## 2014-03-19 MED ORDER — SODIUM CHLORIDE 0.9 % IR SOLN
Status: DC | PRN
  Administered 2014-03-19: 3000 mL

## 2014-03-19 MED ORDER — OXYCODONE HCL 5 MG/5ML PO SOLN: 5.0000 mg | Freq: Once | ORAL | Status: DC | PRN

## 2014-03-19 MED ORDER — ROCURONIUM BROMIDE 50 MG/5ML IV SOLN
INTRAVENOUS | Status: AC
  Filled 2014-03-19: qty 1

## 2014-03-19 MED ORDER — HYDROCHLOROTHIAZIDE 12.5 MG PO CAPS
12.5000 mg | ORAL_CAPSULE | Freq: Every day | ORAL | Status: DC
  Administered 2014-03-19 – 2014-03-21 (×3): 12.5 mg via ORAL
  Filled 2014-03-19 (×3): qty 1

## 2014-03-19 MED ORDER — DEXAMETHASONE SODIUM PHOSPHATE 4 MG/ML IJ SOLN
INTRAMUSCULAR | Status: AC
  Filled 2014-03-19: qty 1

## 2014-03-19 MED ORDER — HYDROCODONE-ACETAMINOPHEN 10-325 MG PO TABS
1.0000 | ORAL_TABLET | ORAL | Status: DC | PRN
  Administered 2014-03-19 – 2014-03-20 (×5): 2 via ORAL
  Administered 2014-03-21: 1 via ORAL
  Filled 2014-03-19 (×2): qty 2
  Filled 2014-03-19: qty 1
  Filled 2014-03-19 (×3): qty 2

## 2014-03-19 MED ORDER — 0.9 % SODIUM CHLORIDE (POUR BTL) OPTIME
TOPICAL | Status: DC | PRN
  Administered 2014-03-19 (×2): 1000 mL

## 2014-03-19 MED ORDER — NAPROXEN 250 MG PO TABS
500.0000 mg | ORAL_TABLET | Freq: Two times a day (BID) | ORAL | Status: DC
  Administered 2014-03-19 – 2014-03-21 (×4): 500 mg via ORAL
  Filled 2014-03-19 (×6): qty 2

## 2014-03-19 MED ORDER — OXYCODONE HCL 5 MG PO TABS: 5.0000 mg | ORAL_TABLET | Freq: Once | ORAL | Status: DC | PRN

## 2014-03-19 MED ORDER — ACETAMINOPHEN 650 MG RE SUPP: 650.0000 mg | Freq: Four times a day (QID) | RECTAL | Status: DC | PRN

## 2014-03-19 MED ORDER — HYDROMORPHONE HCL 1 MG/ML IJ SOLN
0.2500 mg | INTRAMUSCULAR | Status: DC | PRN
  Administered 2014-03-19 (×2): 0.25 mg via INTRAVENOUS
  Administered 2014-03-19: 0.5 mg via INTRAVENOUS

## 2014-03-19 MED ORDER — SODIUM CHLORIDE 0.9 % IJ SOLN
INTRAMUSCULAR | Status: AC
  Filled 2014-03-19: qty 10

## 2014-03-19 MED ORDER — PHENOL 1.4 % MT LIQD
1.0000 | OROMUCOSAL | Status: DC | PRN
Start: 2014-03-19 — End: 2014-03-21

## 2014-03-19 MED ORDER — BUPIVACAINE LIPOSOME 1.3 % IJ SUSP
20.0000 mL | INTRAMUSCULAR | Status: AC
  Administered 2014-03-19: 20 mL
  Filled 2014-03-19: qty 20

## 2014-03-19 MED ORDER — CYCLOBENZAPRINE HCL 10 MG PO TABS: 10.0000 mg | ORAL_TABLET | Freq: Three times a day (TID) | ORAL | Status: DC | PRN

## 2014-03-19 MED ORDER — ONDANSETRON HCL 4 MG/2ML IJ SOLN
INTRAMUSCULAR | Status: DC | PRN
  Administered 2014-03-19: 4 mg via INTRAVENOUS

## 2014-03-19 MED ORDER — LIDOCAINE HCL (CARDIAC) 20 MG/ML IV SOLN
INTRAVENOUS | Status: AC
  Filled 2014-03-19: qty 5

## 2014-03-19 MED ORDER — ACETAMINOPHEN 325 MG PO TABS: 650.0000 mg | ORAL_TABLET | Freq: Four times a day (QID) | ORAL | Status: DC | PRN

## 2014-03-19 MED ORDER — VALSARTAN-HYDROCHLOROTHIAZIDE 320-12.5 MG PO TABS: 1.0000 | ORAL_TABLET | Freq: Every day | ORAL | Status: DC

## 2014-03-19 MED ORDER — NEOSTIGMINE METHYLSULFATE 10 MG/10ML IV SOLN
INTRAVENOUS | Status: DC | PRN
  Administered 2014-03-19: 3 mg via INTRAVENOUS

## 2014-03-19 MED ORDER — CEFAZOLIN SODIUM-DEXTROSE 2-3 GM-% IV SOLR
2.0000 g | Freq: Four times a day (QID) | INTRAVENOUS | Status: AC
  Administered 2014-03-19 (×2): 2 g via INTRAVENOUS
  Filled 2014-03-19 (×2): qty 50

## 2014-03-19 MED ORDER — MORPHINE SULFATE 4 MG/ML IJ SOLN: 4.0000 mg | INTRAMUSCULAR | Status: DC | PRN

## 2014-03-19 MED ORDER — GLYCOPYRROLATE 0.2 MG/ML IJ SOLN
INTRAMUSCULAR | Status: AC
  Filled 2014-03-19: qty 3

## 2014-03-19 MED ORDER — PROMETHAZINE HCL 25 MG/ML IJ SOLN
INTRAMUSCULAR | Status: AC
  Filled 2014-03-19: qty 1

## 2014-03-19 MED ORDER — DEXAMETHASONE SODIUM PHOSPHATE 4 MG/ML IJ SOLN
INTRAMUSCULAR | Status: DC | PRN
  Administered 2014-03-19: 4 mg via INTRAVENOUS

## 2014-03-19 MED ORDER — SCOPOLAMINE 1 MG/3DAYS TD PT72
MEDICATED_PATCH | TRANSDERMAL | Status: AC
  Administered 2014-03-19: 1 via TRANSDERMAL
  Filled 2014-03-19: qty 1

## 2014-03-19 MED ORDER — POTASSIUM CHLORIDE IN NACL 20-0.9 MEQ/L-% IV SOLN
INTRAVENOUS | Status: AC
  Administered 2014-03-19: 15:00:00 via INTRAVENOUS
  Filled 2014-03-19 (×2): qty 1000

## 2014-03-19 MED ORDER — IRBESARTAN 300 MG PO TABS
300.0000 mg | ORAL_TABLET | Freq: Every day | ORAL | Status: DC
  Administered 2014-03-19 – 2014-03-21 (×3): 300 mg via ORAL
  Filled 2014-03-19 (×3): qty 1

## 2014-03-19 MED ORDER — ONDANSETRON HCL 4 MG/2ML IJ SOLN: 4.0000 mg | Freq: Four times a day (QID) | INTRAMUSCULAR | Status: DC | PRN

## 2014-03-19 SURGICAL SUPPLY — 60 items
APL SKNCLS STERI-STRIP NONHPOA (GAUZE/BANDAGES/DRESSINGS) ×1
BENZOIN TINCTURE PRP APPL 2/3 (GAUZE/BANDAGES/DRESSINGS) ×2 IMPLANT
BLADE SAW SGTL 18X1.27X75 (BLADE) ×2 IMPLANT
BLADE SURG ROTATE 9660 (MISCELLANEOUS) IMPLANT
BNDG GAUZE ELAST 4 BULKY (GAUZE/BANDAGES/DRESSINGS) IMPLANT
CAPT HIP TOTAL 2 ×2 IMPLANT
CELLS DAT CNTRL 66122 CELL SVR (MISCELLANEOUS) ×1 IMPLANT
COVER SURGICAL LIGHT HANDLE (MISCELLANEOUS) ×2 IMPLANT
DRAPE C-ARM 42X72 X-RAY (DRAPES) ×2 IMPLANT
DRAPE IMP U-DRAPE 54X76 (DRAPES) ×2 IMPLANT
DRAPE STERI IOBAN 125X83 (DRAPES) ×2 IMPLANT
DRAPE U-SHAPE 47X51 STRL (DRAPES) ×6 IMPLANT
DRSG AQUACEL AG ADV 3.5X10 (GAUZE/BANDAGES/DRESSINGS) ×2 IMPLANT
DURAPREP 26ML APPLICATOR (WOUND CARE) ×2 IMPLANT
ELECT BLADE 4.0 EZ CLEAN MEGAD (MISCELLANEOUS)
ELECT BLADE 6.5 EXT (BLADE) IMPLANT
ELECT CAUTERY BLADE 6.4 (BLADE) ×2 IMPLANT
ELECT REM PT RETURN 9FT ADLT (ELECTROSURGICAL) ×2
ELECTRODE BLDE 4.0 EZ CLN MEGD (MISCELLANEOUS) IMPLANT
ELECTRODE REM PT RTRN 9FT ADLT (ELECTROSURGICAL) ×1 IMPLANT
FACESHIELD WRAPAROUND (MASK) ×6 IMPLANT
GLOVE BIO SURGEON STRL SZ7 (GLOVE) ×2 IMPLANT
GLOVE BIOGEL PI IND STRL 6.5 (GLOVE) ×1 IMPLANT
GLOVE BIOGEL PI IND STRL 7.0 (GLOVE) ×2 IMPLANT
GLOVE BIOGEL PI IND STRL 8 (GLOVE) ×2 IMPLANT
GLOVE BIOGEL PI INDICATOR 6.5 (GLOVE) ×1
GLOVE BIOGEL PI INDICATOR 7.0 (GLOVE) ×2
GLOVE BIOGEL PI INDICATOR 8 (GLOVE) ×2
GLOVE ECLIPSE 8.0 STRL XLNG CF (GLOVE) ×4 IMPLANT
GLOVE ORTHO TXT STRL SZ7.5 (GLOVE) ×4 IMPLANT
GOWN STRL REUS W/ TWL LRG LVL3 (GOWN DISPOSABLE) ×2 IMPLANT
GOWN STRL REUS W/ TWL XL LVL3 (GOWN DISPOSABLE) ×2 IMPLANT
GOWN STRL REUS W/TWL LRG LVL3 (GOWN DISPOSABLE) ×4
GOWN STRL REUS W/TWL XL LVL3 (GOWN DISPOSABLE) ×4
HANDPIECE INTERPULSE COAX TIP (DISPOSABLE) ×2
KIT BASIN OR (CUSTOM PROCEDURE TRAY) ×2 IMPLANT
KIT ROOM TURNOVER OR (KITS) ×2 IMPLANT
MANIFOLD NEPTUNE II (INSTRUMENTS) ×2 IMPLANT
NS IRRIG 1000ML POUR BTL (IV SOLUTION) ×2 IMPLANT
PACK TOTAL JOINT (CUSTOM PROCEDURE TRAY) ×2 IMPLANT
PACK UNIVERSAL I (CUSTOM PROCEDURE TRAY) ×2 IMPLANT
PAD ARMBOARD 7.5X6 YLW CONV (MISCELLANEOUS) ×4 IMPLANT
RTRCTR WOUND ALEXIS 18CM MED (MISCELLANEOUS) ×2
SET HNDPC FAN SPRY TIP SCT (DISPOSABLE) ×1 IMPLANT
SPONGE LAP 18X18 X RAY DECT (DISPOSABLE) IMPLANT
SPONGE LAP 4X18 X RAY DECT (DISPOSABLE) IMPLANT
STRIP CLOSURE SKIN 1/2X4 (GAUZE/BANDAGES/DRESSINGS) ×2 IMPLANT
SUT ETHIBOND NAB CT1 #1 30IN (SUTURE) ×2 IMPLANT
SUT ETHILON 4 0 PS 2 18 (SUTURE) ×2 IMPLANT
SUT MNCRL AB 4-0 PS2 18 (SUTURE) ×2 IMPLANT
SUT VIC AB 0 CT1 27 (SUTURE) ×2
SUT VIC AB 0 CT1 27XBRD ANBCTR (SUTURE) ×1 IMPLANT
SUT VIC AB 1 CT1 27 (SUTURE) ×2
SUT VIC AB 1 CT1 27XBRD ANBCTR (SUTURE) ×1 IMPLANT
SUT VIC AB 2-0 CT1 27 (SUTURE) ×2
SUT VIC AB 2-0 CT1 TAPERPNT 27 (SUTURE) ×1 IMPLANT
TOWEL OR 17X24 6PK STRL BLUE (TOWEL DISPOSABLE) ×2 IMPLANT
TOWEL OR 17X26 10 PK STRL BLUE (TOWEL DISPOSABLE) ×2 IMPLANT
TRAY FOLEY CATH 16FRSI W/METER (SET/KITS/TRAYS/PACK) IMPLANT
WATER STERILE IRR 1000ML POUR (IV SOLUTION) ×4 IMPLANT

## 2014-03-19 NOTE — Op Note (Signed)
Christine [name]             ACCOUNT NO.:  [id number]  MEDICAL RECORD NO.:  [id number]  LOCATION:  5N32C                        FACILITY:  [facility]  PHYSICIAN:  Christine [name], M.D.    DATE OF BIRTH:  07-May-1955  DATE OF PROCEDURE: DATE OF DISCHARGE:                              OPERATIVE REPORT   PREOPERATIVE DIAGNOSIS:  Right hip arthritis.  POSTOPERATIVE DIAGNOSIS:  Right hip arthritis.  PROCEDURE:  Right hip replacement in the anterior approach using DePuy components, Corail stem size 8 collared, 48-mm Pinnacle cup with one screw and +0 liner.  SURGEON:  Christine [name], M.D.  ASSISTANT:  Christine [name]. Christine [name], M.D.  ANESTHESIA:  General.  ESTIMATED BLOOD LOSS:  250 mL.  INDICATIONS:  Christine [name] is a patient with end-stage right hip arthritis, presents for operative management after explanation of risks and benefits.  PROCEDURE IN DETAIL:  The patient was brought to the operating room where general anesthetic was induced.  Preoperative antibiotics were administered.  Time-out was called.  Right hip area was prescrubbed with alcohol and Betadine, allowed to air dry, prepped with DuraPrep solution and draped in a sterile manner.  Cotton bed was utilized.  Preoperative AP pelvis obtained demonstrating offset and leg lengths of both hips. This image was saved.  The operative field was covered with Ioban. Incision was made approximately 2 cm distal and lateral to the anterior superior iliac crest.  The patient's pannus was taped in multiple directions in order to expose the hip.  The skin around the incision was intact.  Following this, about a 10-cm incision was made.  Skin and subcutaneous tissue were sharply divided.  The fascia over the tensor fascia lata was divided.  The muscle was dissected off the anterior flap of the tensor fascia lata fascia.  An Allis clamp was used to tag the fascia.  Cobra retractor was then placed behind the superior femoral neck.  The  circumflex vessels were then cauterized.  Cobra retractor was then place along the anterior neck.  Soft tissue and fat were removed from the anterior capsule.  Capsulectomy was then performed at a 135- degree angle.  The Cobra retractors were then placed on the inside surface of the neck.  Neck cut was then performed under fluoroscopic guidance in accordance with preoperative templating.  At this time following the neck cut, the leg was externally rotated and capsular dissection taken down to the lesser trochanter.  At this time, retractor was placed between the capsulolabral junction on the acetabulum.  The labrum was then excised circumferentially.  Reaming was then performed in approximately 40 degrees of abduction and 10 degrees of anteversion. A 48 Pinnacle cup was then tapped into position.  Good fixation was obtained with the screw.  Attention was then redirected towards the femur.  The femur was then taken into external rotation and adduction. A hook was placed as a shelf for the femur.  Mueller retractor placed medially.  Trochanteric retractor placed between the muscle and the capsule superiorly.  Box cutter was then used to obtain lateralization. The patient had a very small femoral canal.  At this time, the broaching was performed up to a size 8.  Additional  lateralization to sink the prosthesis further was required.  At this time, the hip was reduced with the trial in position.  Good lateralization was performed.  Offset restored.  Leg lengths approximately equal on fluoroscopic AP pelvis. At this time, a true component was placed in appropriate anteversion and ceramic ball was placed.  This gave excellent stability of the external rotation, internal rotation and traction.  At this time, thorough irrigation was performed.  Capsule was closed.  Fascia lata was closed using a 0 Vicryl suture.  Skin was closed using interrupted inverted 2-0 Vicryl suture and a 4-0 Monocryl.   Mepilex dressing was placed.  The patient tolerated the procedure well without immediate complication. Leg lengths approximately equal at the conclusion of the case.  She tolerated the procedure well without immediate complication. Christine [name]'s assistance was required during the case for retraction, neurovascular structure protection.  His assistance was a medical necessity.     Christine [name], M.D.     GSD/MEDQ  D:  03/19/2014  T:  03/19/2014  Job:  [id number]

## 2014-03-19 NOTE — Anesthesia Procedure Notes (Signed)
Procedure Name: Intubation Date/Time: 03/19/2014 7:40 AM Performed by: Susa Loffler Pre-anesthesia Checklist: Patient identified, Emergency Drugs available, Suction available, Patient being monitored and Timeout performed Patient Re-evaluated:Patient Re-evaluated prior to inductionOxygen Delivery Method: Circle system utilized Preoxygenation: Pre-oxygenation with 100% oxygen Intubation Type: IV induction Ventilation: Mask ventilation without difficulty and Oral airway inserted - appropriate to patient size Laryngoscope Size: Mac and 3 Grade View: Grade I Tube type: Oral Tube size: 7.0 mm Number of attempts: 1 Airway Equipment and Method: Stylet and Oral airway Placement Confirmation: ETT inserted through vocal cords under direct vision,  positive ETCO2 and breath sounds checked- equal and bilateral Secured at: 20 cm Tube secured with: Tape Dental Injury: Teeth and Oropharynx as per pre-operative assessment

## 2014-03-19 NOTE — Transfer of Care (Signed)
Immediate Anesthesia Transfer of Care Note  Patient: Christine [name]  Procedure(s) Performed: Procedure(s): TOTAL HIP ARTHROPLASTY ANTERIOR APPROACH (Right)  Patient Location: PACU  Anesthesia Type:General  Level of Consciousness: awake, alert  and oriented  Airway & Oxygen Therapy: Patient Spontanous Breathing and Patient connected to nasal cannula oxygen  Post-op Assessment: Report given to RN and Post -op Vital signs reviewed and stable  Post vital signs: Reviewed and stable  Last Vitals:  Filed Vitals:   03/19/14 0618  BP: 135/73  Pulse: 96  Temp: 36.9 C  Resp: 18    Complications: No apparent anesthesia complications

## 2014-03-19 NOTE — Progress Notes (Signed)
UR complete.  Aluna Whiston RN, MSN 

## 2014-03-19 NOTE — Anesthesia Preprocedure Evaluation (Addendum)
Anesthesia Evaluation  Patient identified by MRN, date of birth, ID band Patient awake    History of Anesthesia Complications (+) PONV and history of anesthetic complications  Airway Mallampati: II  TM Distance: >3 FB Neck ROM: Full    Dental   Multiple crowns otherwise no loose teeth or dentures :   Pulmonary asthma ,  breath sounds clear to auscultation        Cardiovascular hypertension, Rhythm:Regular Rate:Normal     Neuro/Psych negative neurological ROS  negative psych ROS   GI/Hepatic Neg liver ROS, hiatal hernia, GERD-  Controlled,  Endo/Other  negative endocrine ROS  Renal/GU negative Renal ROS  negative genitourinary   Musculoskeletal  (+) Arthritis -,   Abdominal (+) + obese,   Peds  Hematology negative hematology ROS (+)   Anesthesia Other Findings   Reproductive/Obstetrics negative OB ROS                            Anesthesia Physical Anesthesia Plan  ASA: III  Anesthesia Plan: General   Post-op Pain Management:    Induction: Intravenous  Airway Management Planned: Oral ETT  Additional Equipment: None  Intra-op Plan:   Post-operative Plan: Extubation in OR  Informed Consent: I have reviewed the patients History and Physical, chart, labs and discussed the procedure including the risks, benefits and alternatives for the proposed anesthesia with the patient or authorized representative who has indicated his/her understanding and acceptance.     Plan Discussed with:   Anesthesia Plan Comments:        Anesthesia Quick Evaluation

## 2014-03-19 NOTE — Anesthesia Postprocedure Evaluation (Signed)
  Anesthesia Post-op Note  Patient: Christine [name]  Procedure(s) Performed: Procedure(s): TOTAL HIP ARTHROPLASTY ANTERIOR APPROACH (Right)  Patient Location: PACU  Anesthesia Type:General  Level of Consciousness: awake, alert  and oriented  Airway and Oxygen Therapy: Patient Spontanous Breathing  Post-op Pain: 3 /10  Post-op Assessment: Post-op Vital signs reviewed, Patient's Cardiovascular Status Stable, Respiratory Function Stable, Patent Airway and No signs of Nausea or vomiting  Post-op Vital Signs: Reviewed and stable  Last Vitals:  Filed Vitals:   03/19/14 1400  BP: 140/79  Pulse: 95  Temp: 36.6 C  Resp: 21    Complications: No apparent anesthesia complications

## 2014-03-19 NOTE — Interval H&P Note (Signed)
History and Physical Interval Note:  03/19/2014 7:17 AM  Christine [name]  has presented today for surgery, with the diagnosis of RIGHT HIP OSTEOARTHRITIS  The various methods of treatment have been discussed with the patient and family. After consideration of risks, benefits and other options for treatment, the patient has consented to  Procedure(s): TOTAL HIP ARTHROPLASTY ANTERIOR APPROACH (Right) as a surgical intervention .  The patient's history has been reviewed, patient examined, no change in status, stable for surgery.  I have reviewed the patient's chart and labs.  Questions were answered to the patient's satisfaction.  Skin ok around groin crease - slight excoriation away from planned incision without erythema or desquamation   DEAN,GREGORY SCOTT

## 2014-03-19 NOTE — Evaluation (Signed)
Physical Therapy Evaluation Patient Details Name: Christine [name] MRN: [id number] DOB: [birth year] Today's Date: 03/19/2014   History of Present Illness  59 y.o. female admitted to [facility] on 03/19/14 for elective R direct anterior THA.  Pt with significant PMHx of arthritis (per pt bil shoulders bil knees), HTN, and right knee arthroscopy.    Clinical Impression  Pt is POD #0 and is moving well, min assist for short distance gait with RW.  She was able to initiate right hip exercises and she and her sister were educated on WBAT status.  Questions about motion restrictions (there are none) and activity progression answered.  I anticipate she will be able to d/c home with sister's assist to her sister's home with HHPT f/u, RW and 3-in-1.   PT to follow acutely for deficits listed below.         Follow Up Recommendations Home health PT;Supervision for mobility/OOB    Equipment Recommendations  Rolling walker with 5" wheels;3in1 (PT)    Recommendations for Other Services   NA    Precautions / Restrictions Precautions Precautions: None Restrictions Weight Bearing Restrictions: Yes RLE Weight Bearing: Weight bearing as tolerated      Mobility  Bed Mobility Overal bed mobility: Needs Assistance Bed Mobility: Supine to Sit     Supine to sit: Min assist     General bed mobility comments: Min assist to support right leg during transitions to EOB. Verbal cues for 1/2 bridge technique and for hand placement.  HOB elevated ~30 degrees and pt using bed rail for leverage.   Transfers Overall transfer level: Needs assistance Equipment used: Rolling walker (2 wheeled) Transfers: Sit to/from Stand Sit to Stand: Min assist         General transfer comment: Min assist to support trunk during transitions. Verbal cues for safe hand placement.   Ambulation/Gait Ambulation/Gait assistance: Min assist Ambulation Distance (Feet): 5 Feet Assistive device: Rolling walker (2 wheeled) Gait  Pattern/deviations: Step-to pattern;Antalgic     General Gait Details: Moderately antalgic gait pattern. Verbal cues for safest LE sequencing.                Pertinent Vitals/Pain Pain Assessment: 0-10 Pain Score: 4  Pain Location: right hip and right knee Pain Descriptors / Indicators: Aching;Burning Pain Intervention(s): Limited activity within patient's tolerance;Monitored during session;Repositioned    Home Living Family/patient expects to be discharged to:: Private residence Living Arrangements: Other relatives (sister's home for 3 weeks) Available Help at Discharge: Family;Available 24 hours/day (sister) Type of Home: House Home Access: Stairs to enter Entrance Stairs-Rails: Right;Left;Can reach both Entrance Stairs-Number of Steps: 3 Home Layout: One level Home Equipment: Walker - standard;Cane - single point      Prior Function Level of Independence: Independent with assistive device(s)         Comments: used walker to walk.      Hand Dominance   Dominant Hand: Right    Extremity/Trunk Assessment   Upper Extremity Assessment: Defer to OT evaluation (pt with limited R shoulder ROM per pt due to arthritis)           Lower Extremity Assessment: RLE deficits/detail RLE Deficits / Details: right leg with normal post op pain and weakness.  Reported right knee pain as well (per pt right knee arthritic and more so than left knee).  Ankle 3/5, knee 3/5, hip 2+/5    Cervical / Trunk Assessment: Normal  Communication   Communication: No difficulties  Cognition Arousal/Alertness: Awake/alert Behavior During Therapy: [facility]  for tasks assessed/performed Overall Cognitive Status: Within Functional Limits for tasks assessed                         Exercises Total Joint Exercises Ankle Circles/Pumps: AROM;Both;10 reps;Supine Quad Sets: AROM;Right;10 reps;Supine Heel Slides: AAROM;Right;10 reps;Supine Hip ABduction/ADduction: AAROM;Right;10  reps;Supine      Assessment/Plan    PT Assessment Patient needs continued PT services  PT Diagnosis Difficulty walking;Abnormality of gait;Generalized weakness;Acute pain   PT Problem List Decreased strength;Decreased range of motion;Decreased activity tolerance;Decreased balance;Decreased mobility;Decreased knowledge of use of DME;Pain  PT Treatment Interventions DME instruction;Stair training;Gait training;Functional mobility training;Balance training;Therapeutic exercise;Therapeutic activities;Neuromuscular re-education;Patient/family education;Manual techniques;Modalities   PT Goals (Current goals can be found in the Care Plan section) Acute Rehab PT Goals Patient Stated Goal: to get back to being active, go to church PT Goal Formulation: With patient/family Time For Goal Achievement: 03/26/14 Potential to Achieve Goals: Good    Frequency 7X/week    End of Session   Activity Tolerance: Patient limited by pain Patient left: in chair;with call bell/phone within reach;with family/visitor present Nurse Communication: Mobility status         Time: 7867-5449 PT Time Calculation (min) (ACUTE ONLY): 33 min   Charges:   PT Evaluation $Initial PT Evaluation Tier I: 1 Procedure PT Treatments $Therapeutic Activity: 8-22 mins        Tabita B. Bowie, [place], DPT [phone]   03/19/2014, 5:48 PM

## 2014-03-19 NOTE — Discharge Instructions (Signed)

## 2014-03-19 NOTE — Brief Op Note (Signed)
03/19/2014  10:41 AM  PATIENT:  Christine [name]  59 y.o. female  PRE-OPERATIVE DIAGNOSIS:  RIGHT HIP OSTEOARTHRITIS  POST-OPERATIVE DIAGNOSIS:  RIGHT HIP OSTEOARTHRITIS  PROCEDURE:  Procedure(s): TOTAL HIP ARTHROPLASTY ANTERIOR APPROACH  SURGEON:  Surgeon(s): Meredith Pel, MD Mcarthur Rossetti, MD  ASSISTANT: c blackman md  ANESTHESIA:   general  EBL: 250 ml    Total I/O In: 1500 [I.V.:1500] Out: 525 [Urine:275; Blood:250]  BLOOD ADMINISTERED: none  DRAINS: none   LOCAL MEDICATIONS USED:  exparel  SPECIMEN:  No Specimen  COUNTS:  YES  TOURNIQUET:  * No tourniquets in log *  DICTATION: .Other Dictation: Dictation Number [id number]  PLAN OF CARE: Admit to inpatient   PATIENT DISPOSITION:  PACU - hemodynamically stable

## 2014-03-20 ENCOUNTER — Encounter (HOSPITAL_COMMUNITY): Payer: Self-pay | Admitting: Orthopedic Surgery

## 2014-03-20 LAB — CBC
HCT: 29.4 % — ABNORMAL LOW (ref 36.0–46.0)
HEMOGLOBIN: 9.5 g/dL — AB (ref 12.0–15.0)
MCH: 29.8 pg (ref 26.0–34.0)
MCHC: 32.3 g/dL (ref 30.0–36.0)
MCV: 92.2 fL (ref 78.0–100.0)
PLATELETS: 211 10*3/uL (ref 150–400)
RBC: 3.19 MIL/uL — ABNORMAL LOW (ref 3.87–5.11)
RDW: 12.8 % (ref 11.5–15.5)
WBC: 8 10*3/uL (ref 4.0–10.5)

## 2014-03-20 LAB — BASIC METABOLIC PANEL
ANION GAP: 6 (ref 5–15)
BUN: 9 mg/dL (ref 6–23)
CALCIUM: 8.8 mg/dL (ref 8.4–10.5)
CO2: 27 mmol/L (ref 19–32)
CREATININE: 0.64 mg/dL (ref 0.50–1.10)
Chloride: 105 mmol/L (ref 96–112)
GFR calc Af Amer: >90 mL/min (ref >90)
Glucose, Bld: 84 mg/dL (ref 70–99)
Potassium: 3.9 mmol/L (ref 3.5–5.1)
Sodium: 138 mmol/L (ref 135–145)

## 2014-03-20 NOTE — Evaluation (Signed)
Occupational Therapy Evaluation Patient Details Name: Christine [name] MRN: [id number] DOB: April 05, 1955 Today's Date: 03/20/2014    History of Present Illness 59 y.o. female admitted to [facility] on 03/19/14 for elective R direct anterior THA.  Pt with significant PMHx of arthritis (per pt bil shoulders bil knees), HTN, and right knee arthroscopy.     Clinical Impression   Patient mod I PTA. Patient currently requires total assist for LB ADLs and min assist for functional mobility/transfers. Patient will benefit from acute OT to increase overall independence in the areas of ADLs, functional mobility, and overall safety in order to safely discharge home with 24/7 supervision/assist and HHOT.     Follow Up Recommendations  Home health OT;Supervision/Assistance - 24 hour    Equipment Recommendations  3 in 1 bedside comode;Other (comment) (AE  - reacher, sock aid, LH sponge, LH shoe horn)    Recommendations for Other Services  None at this time   Precautions / Restrictions Precautions Precautions: None Restrictions Weight Bearing Restrictions: Yes RLE Weight Bearing: Weight bearing as tolerated      Mobility Bed Mobility Overal bed mobility: Needs Assistance Bed Mobility: Supine to Sit     Supine to sit: Mod assist     General bed mobility comments: Mod assist for trunk control from supine>sit. HOB flat and patient heavily using bed rails.   Transfers Overall transfer level: Needs assistance Equipment used: Rolling walker (2 wheeled) Transfers: Sit to/from Stand Sit to Stand: Min assist         General transfer comment: Verbal cues for safety and hand placement     Balance Overall balance assessment: Needs assistance Sitting-balance support: No upper extremity supported;Feet supported Sitting balance-Leahy Scale: Fair     Standing balance support: Bilateral upper extremity supported;During functional activity Standing balance-Leahy Scale: Poor    ADL Overall ADL's :  Needs assistance/impaired Eating/Feeding: Set up;Sitting   Grooming: Set up;Sitting   Upper Body Bathing: Minimal assitance;Sitting   Lower Body Bathing: Total assistance;Sit to/from stand;Cueing for safety   Upper Body Dressing : Minimal assistance;Sitting   Lower Body Dressing: Total assistance;Sit to/from stand;Cueing for safety   Toilet Transfer: Minimal assistance;RW;BSC;Ambulation           Functional mobility during ADLs: Minimal assistance;Rolling walker;Cueing for safety General ADL Comments: Patient limited by poor shoulder AROM. Patient required mod assist to assist with trunk control to transfer from supine>sit. Min assist with cues for safety and technique required for sit<>stand and functional ambulation. Total assist for LB ADLs, patient unable to reach BLEs. Educated patient on use of AE to increase independence with this and encouraged patient to purchase hip kit for home use. Plan to introduce and demonstrate AE usage and practice tub/shower transfer using [facility]    Pertinent Vitals/Pain Pain Assessment: No/denies pain Pain Score: 5  Pain Location: right hip and knee Pain Descriptors / Indicators: Sore Pain Intervention(s): Monitored during session;Repositioned     Hand Dominance Right   Extremity/Trunk Assessment Upper Extremity Assessment Upper Extremity Assessment: RUE deficits/detail;LUE deficits/detail RUE Deficits / Details: Decreased AROM and PROM due to arthritis and rotator cuff injury per patient report; pre morbid  LUE Deficits / Details: Decreased AROM and PROM due to artritis per patient report; pre morbid   Lower Extremity Assessment Lower Extremity Assessment: Defer to PT evaluation   Cervical / Trunk Assessment Cervical / Trunk Assessment: Normal   Communication Communication Communication: No difficulties   Cognition Arousal/Alertness: Awake/alert Behavior During Therapy: WFL for tasks assessed/performed Overall Cognitive Status:  Within  Functional Limits for tasks assessed             Home Living Family/patient expects to be discharged to:: Private residence Living Arrangements: Other relatives (pt to return to sisters house for ~3 weeks) Available Help at Discharge: Family;Available 24 hours/day (sister) Type of Home: House Home Access: Stairs to enter [employer] of Steps: 3 Entrance Stairs-Rails: Right;Left;Can reach both Home Layout: One level     Bathroom Shower/Tub: Tub/shower unit;Curtain   Biochemist, clinical: Standard     Home Equipment: Walker - standard;Cane - single point          Prior Functioning/Environment Level of Independence: Independent with assistive device(s)        Comments: used walker to walk.     OT Diagnosis: Generalized weakness;Acute pain   OT Problem List: Decreased strength;Decreased activity tolerance;Decreased range of motion;Decreased safety awareness;Impaired balance (sitting and/or standing);Decreased knowledge of use of DME or AE;Decreased knowledge of precautions;Pain   OT Treatment/Interventions: Self-care/ADL training;Therapeutic exercise;Energy conservation;DME and/or AE instruction;Therapeutic activities;Patient/family education;Balance training    OT Goals(Current goals can be found in the care plan section) Acute Rehab OT Goals Patient Stated Goal: soreness to go away OT Goal Formulation: With patient Time For Goal Achievement: 03/27/14 Potential to Achieve Goals: Good ADL Goals Pt Will Perform Grooming: standing;with supervision Pt Will Perform Lower Body Bathing: with min assist;with adaptive equipment;sit to/from stand Pt Will Perform Lower Body Dressing: with min assist;with adaptive equipment;sit to/from stand Pt Will Transfer to Toilet: with supervision;bedside commode;ambulating Pt Will Perform Tub/Shower Transfer: Tub transfer;with min assist;3 in 1;grab bars;rolling walker;ambulating  OT Frequency: Min 2X/week   Barriers to D/C: None  known at this time          End of Session Equipment Utilized During Treatment: Rolling walker  Activity Tolerance: Patient tolerated treatment well Patient left: in chair;with call bell/phone within reach   Time: 4235-3614 OT Time Calculation (min): 23 min Charges:  OT General Charges $OT Visit: 1 Procedure OT Evaluation $Initial OT Evaluation Tier I: 1 Procedure OT Treatments $Self Care/Home Management : 8-22 mins  Harsimran Westman , MS, OTR/L, CLT Pager: [phone]  03/20/2014, 9:26 AM

## 2014-03-20 NOTE — Progress Notes (Signed)
Physical Therapy Treatment Patient Details Name: Christine [name] MRN: [id number] DOB: Apr 02, 1955 Today's Date: 03/20/2014    History of Present Illness 59 y.o. female admitted to [name] on 03/19/14 for elective R direct anterior THA.  Pt with significant PMHx of arthritis (per pt bil shoulders bil knees), HTN, and right knee arthroscopy.      PT Comments    Pt is POD #1 and this is her second session.  She is progressing nicely and was able to ambulate with much less effort this PM.  She was also able to initiate stair training.  Pt would benefit from one more session to review stairs with sister present who is going to be her caregiver at home tomorrow AM and then if MD deems she is medically appropriate for d/c then she can be physically ready as well.  Follow Up Recommendations  Home health PT;Supervision for mobility/OOB     Equipment Recommendations  Rolling walker with 5" wheels;3in1 (PT)    Recommendations for Other Services   NA     Precautions / Restrictions Precautions Precautions: None Restrictions RLE Weight Bearing: Weight bearing as tolerated    Mobility  Bed Mobility Overal bed mobility: Needs Assistance Bed Mobility: Sit to Supine;Supine to Sit     Supine to sit: Min guard Sit to supine: Mod assist   General bed mobility comments: Min guard assist to help progress right leg over last 1/4 of EOB, mod assist needed to lift both legs back into the bed from sitting.   Transfers Overall transfer level: Needs assistance Equipment used: Rolling walker (2 wheeled) Transfers: Sit to/from Stand Sit to Stand: Min guard         General transfer comment: Min guard assist for safety during transitions.   Ambulation/Gait Ambulation/Gait assistance: Min guard Ambulation Distance (Feet): 100 Feet Assistive device: Rolling walker (2 wheeled) Gait Pattern/deviations: Step-through pattern;Antalgic Gait velocity: decreased Gait velocity interpretation: Below normal  speed for age/gender General Gait Details: Pt with minimally antalgic gait pattern, better speed and less reliance on hands this session.    Stairs Stairs: Yes Stairs assistance: Min guard Stair Management: Two rails;Step to pattern;Forwards Number of Stairs: 5 General stair comments: Pt needed min guard assist for safety (especially when she forgot her LE sequencing and lead down with the wrong leg x 1), verbal cues for correct LE sequencing.          Balance Overall balance assessment: Needs assistance Sitting-balance support: Feet supported;No upper extremity supported Sitting balance-Leahy Scale: Good     Standing balance support: Bilateral upper extremity supported;No upper extremity supported;Single extremity supported Standing balance-Leahy Scale: Fair                      Cognition Arousal/Alertness: Awake/alert Behavior During Therapy: WFL for tasks assessed/performed Overall Cognitive Status: Within Functional Limits for tasks assessed                      Exercises Total Joint Exercises Hip ABduction/ADduction: AROM;Right;10 reps;Standing Knee Flexion: AROM;Right;10 reps;Standing Marching in Standing: AROM;Right;10 reps;Standing Standing Hip Extension: AROM;Right;10 reps;Standing    General Comments General comments (skin integrity, edema, etc.): Pt reported tail bone pain and I advised her to turn off of her bottom to her left side for a little bit and she immediately got relief.        Pertinent Vitals/Pain Pain Assessment: 0-10 Pain Score: 4  Pain Location: right hip Pain Descriptors / Indicators: Aching;Burning Pain Intervention(s): Limited  activity within patient's tolerance;Monitored during session;Premedicated before session;Repositioned;Ice applied           PT Goals (current goals can now be found in the care plan section) Acute Rehab PT Goals Patient Stated Goal: to go home, decreased pain and stiffness Progress towards PT  goals: Progressing toward goals    Frequency  7X/week    PT Plan Current plan remains appropriate       End of Session   Activity Tolerance: Patient limited by fatigue;Patient limited by pain Patient left: in bed;with call bell/phone within reach;with family/visitor present     Time: 0272-5366 PT Time Calculation (min) (ACUTE ONLY): 26 min  Charges:  $Gait Training: 8-22 mins $Therapeutic Exercise: 8-22 mins                     Tayen B. Mandel Seiden, PT, DPT [phone]   03/20/2014, 6:36 PM

## 2014-03-20 NOTE — Progress Notes (Signed)
03/20/14 Set up with [place] for HHPT and [place] by MD office. Spoke with patient, no change in d/c plan, will have assistance after d/c. Contacted Frank with [facility] and requested rolling walker, 3N1 and tub bench be delivered to patient's room. No other d/c needs identified. Will follow until d/c.

## 2014-03-20 NOTE — Progress Notes (Signed)
Physical Therapy Treatment Patient Details Name: Christine [name] MRN: [id number] DOB: Jun 26, 1955 Today's Date: 03/20/2014    History of Present Illness 59 y.o. female admitted to [facility] on 03/19/14 for elective R direct anterior THA.  Pt with significant PMHx of arthritis (per pt bil shoulders bil knees), HTN, and right knee arthroscopy.      PT Comments    Pt is POD #1 and is moving well min assist overall for mobility and gait into the hallway with RW.  Adjusted RW down to better fit pt's height.  Pt with limited endurance and other arthritic pain affecting her ability to move better right now. She continues to be appropriate for HHPT at f/u.  PT will continue to follow acutely.  Follow Up Recommendations  Home health PT;Supervision for mobility/OOB     Equipment Recommendations  Rolling walker with 5" wheels;3in1 (PT)    Recommendations for Other Services   NA     Precautions / Restrictions Precautions Precautions: None Restrictions Weight Bearing Restrictions: Yes RLE Weight Bearing: Weight bearing as tolerated    Mobility  Bed Mobility Overal bed mobility: Needs Assistance Bed Mobility: Supine to Sit     Supine to sit: Mod assist     General bed mobility comments: Mod assist for trunk control from supine>sit. HOB flat and patient heavily using bed rails.   Transfers Overall transfer level: Needs assistance Equipment used: Rolling walker (2 wheeled) Transfers: Sit to/from Stand Sit to Stand: Min assist;Min guard         General transfer comment: Min assist to support trunk for initial sit to stand transfer due to stiffness.  Min guard for second transfer from elevated BSC.  Verbal cues for safe hand placement.  Ambulation/Gait Ambulation/Gait assistance: Min assist Ambulation Distance (Feet): 55 Feet Assistive device: Rolling walker (2 wheeled) Gait Pattern/deviations: Step-to pattern;Antalgic     General Gait Details: Pt with moderately antalgic gait pattern  which improved with increased distance.  Pt needed support at trunk for balance and manual assist to help steer RW (veering to the right).  Pt with limited gait distance and speed due to surgical and other arthritic pain.  Limited endurance.           Balance Overall balance assessment: Needs assistance Sitting-balance support: Feet supported;No upper extremity supported Sitting balance-Leahy Scale: Good     Standing balance support: Bilateral upper extremity supported;No upper extremity supported;Single extremity supported Standing balance-Leahy Scale: Poor                      Cognition Arousal/Alertness: Awake/alert Behavior During Therapy: WFL for tasks assessed/performed Overall Cognitive Status: Within Functional Limits for tasks assessed                      Exercises Total Joint Exercises Ankle Circles/Pumps: AROM;Both;10 reps;Supine Quad Sets: AROM;Right;10 reps;Supine Short Arc Quad: AROM;Right;10 reps;Seated Heel Slides: AAROM;Right;10 reps;Supine Hip ABduction/ADduction: AAROM;Right;10 reps;Supine Long Arc Quad: AROM;Both;10 reps;Seated        Pertinent Vitals/Pain Pain Assessment: 0-10 Pain Score: 6  Pain Location: right hip and low back Pain Descriptors / Indicators: Aching;Burning;Sore;Tightness Pain Intervention(s): Limited activity within patient's tolerance;Monitored during session;Repositioned    Home Living Family/patient expects to be discharged to:: Private residence Living Arrangements: Other relatives (pt to return to sisters house for ~3 weeks) Available Help at Discharge: Family;Available 24 hours/day (sister) Type of Home: House Home Access: Stairs to enter Entrance Stairs-Rails: Right;Left;Can reach both Home Layout: One level Home  Equipment: Walker - standard;Cane - single point      Prior Function Level of Independence: Independent with assistive device(s)      Comments: used walker to walk.    PT Goals (current  goals can now be found in the care plan section) Acute Rehab PT Goals Patient Stated Goal: to go home, decreased pain and stiffness Progress towards PT goals: Progressing toward goals    Frequency  7X/week    PT Plan Current plan remains appropriate       End of Session   Activity Tolerance: Patient limited by fatigue;Patient limited by pain Patient left: in chair;with call bell/phone within reach;with family/visitor present     Time: 0301-3143 PT Time Calculation (min) (ACUTE ONLY): 19 min  Charges:  $Gait Training: 8-22 mins                     Viviene B. Genecis Veley, PT, DPT [phone]   03/20/2014, 10:52 AM

## 2014-03-20 NOTE — Progress Notes (Signed)
Subjective: Pt stable - pain ok   Objective: Vital signs in last 24 hours: Temp:  [97.6 F (36.4 C)-98.1 F (36.7 C)] 97.6 F (36.4 C) (03/16 0519) Pulse Rate:  [94-103] 95 (03/16 0519) Resp:  [14-28] 16 (03/16 0519) BP: (81-140)/(46-79) 103/54 mmHg (03/16 0519) SpO2:  [97 %-100 %] 100 % (03/16 0519)  Intake/Output from previous day: 03/15 0701 - 03/16 0700 In: 2850 [P.O.:450; I.V.:2400] Out: 3045 [Urine:2795; Blood:250] Intake/Output this shift:    Exam:  Neurovascular intact Sensation intact distally Intact pulses distally  Labs: No results for input(s): HGB in the last 72 hours. No results for input(s): WBC, RBC, HCT, PLT in the last 72 hours. No results for input(s): NA, K, CL, CO2, BUN, CREATININE, GLUCOSE, CALCIUM in the last 72 hours. No results for input(s): LABPT, INR in the last 72 hours.  Assessment/Plan: Plan pt today and oob - labs pending   Christine [name] 03/20/2014, 7:55 AM

## 2014-03-21 LAB — CBC
HCT: 28.6 % — ABNORMAL LOW (ref 36.0–46.0)
HEMOGLOBIN: 9.2 g/dL — AB (ref 12.0–15.0)
MCH: 29.7 pg (ref 26.0–34.0)
MCHC: 32.2 g/dL (ref 30.0–36.0)
MCV: 92.3 fL (ref 78.0–100.0)
Platelets: 222 10*3/uL (ref 150–400)
RBC: 3.1 MIL/uL — ABNORMAL LOW (ref 3.87–5.11)
RDW: 13.1 % (ref 11.5–15.5)
WBC: 9.1 10*3/uL (ref 4.0–10.5)

## 2014-03-21 MED ORDER — HYDROCODONE-ACETAMINOPHEN 10-325 MG PO TABS: 1.0000 | ORAL_TABLET | ORAL | Status: DC | PRN

## 2014-03-21 MED ORDER — RIVAROXABAN 10 MG PO TABS: 10.0000 mg | ORAL_TABLET | Freq: Every day | ORAL | Status: DC

## 2014-03-21 NOTE — Progress Notes (Signed)
Physical Therapy Treatment Patient Details Name: Christine [name] MRN: [id number] DOB: [birth year] Today's Date: 03/21/2014    History of Present Illness 59 y.o. female admitted to [facility] on 03/19/14 for elective R direct anterior THA.  Pt with significant PMHx of arthritis (per pt bil shoulders bil knees), HTN, and right knee arthroscopy.      PT Comments    Pt is progressing well with her mobility.  She has completed all mobility education necessary to go home with her sister's assist at discharge. PT will continue to follow acutely, but I believe the plan is to d/c this PM.    Follow Up Recommendations  Home health PT;Supervision for mobility/OOB     Equipment Recommendations  Rolling walker with 5" wheels;3in1 (PT)    Recommendations for Other Services   NA     Precautions / Restrictions Precautions Precautions: None Restrictions Weight Bearing Restrictions: Yes RLE Weight Bearing: Weight bearing as tolerated    Mobility  Bed Mobility               General bed mobility comments: pt seated EOB when PT arrived and left seated EOB at end of session per pt preference.   Transfers Overall transfer level: Needs assistance Equipment used: Rolling walker (2 wheeled) Transfers: Sit to/from Stand Sit to Stand: Min guard         General transfer comment: Min guard assist to mildly stabilize RW during transitions as pt is pushing up with one hand on the bed and one hand on RW.    Ambulation/Gait Ambulation/Gait assistance: Supervision Ambulation Distance (Feet): 100 Feet Assistive device: Rolling walker (2 wheeled) Gait Pattern/deviations: Step-through pattern;Antalgic Gait velocity: decreased Gait velocity interpretation: Below normal speed for age/gender General Gait Details: Pt with minimally antalgic gait pattern. Better control of RW today.    Stairs Stairs: Yes Stairs assistance: Supervision Stair Management: Two rails;Forwards;Step to pattern Number of  Stairs: 5 General stair comments: supervision for safety due to slow speed of doing step and limited by her arthritic knees.  Educated sister re: correct LE sequence and pt able to demonstrate correct sequence 100% of time.       Balance Overall balance assessment: Needs assistance Sitting-balance support: Feet supported;No upper extremity supported Sitting balance-Leahy Scale: Good     Standing balance support: Bilateral upper extremity supported;No upper extremity supported;Single extremity supported Standing balance-Leahy Scale: Fair                      Cognition Arousal/Alertness: Awake/alert Behavior During Therapy: WFL for tasks assessed/performed Overall Cognitive Status: Within Functional Limits for tasks assessed                      Exercises Total Joint Exercises Hip ABduction/ADduction: AROM;Right;10 reps;Standing Knee Flexion: AROM;Right;10 reps;Standing Marching in Standing: AROM;Right;10 reps;Standing Standing Hip Extension: AROM;Right;10 reps;Standing        Pertinent Vitals/Pain Pain Assessment: 0-10 Pain Score: 5  Pain Location: right hip, no back pain yet today, no tailbone pain Pain Descriptors / Indicators: Aching;Burning Pain Intervention(s): Limited activity within patient's tolerance;Monitored during session;Repositioned           PT Goals (current goals can now be found in the care plan section) Acute Rehab PT Goals Patient Stated Goal: to go home, decreased pain and stiffness Progress towards PT goals: Progressing toward goals    Frequency  7X/week    PT Plan Current plan remains appropriate       End of  Session   Activity Tolerance: Patient limited by pain Patient left: in bed;Other (comment);with family/visitor present (seated EOB)     Time: 0569-7948 PT Time Calculation (min) (ACUTE ONLY): 12 min  Charges:  $Gait Training: 8-22 mins                      Teara B. Luree Palla, PT, DPT [phone]    03/21/2014, 11:03 AM

## 2014-03-21 NOTE — Progress Notes (Signed)
Christine [name] discharged home per MD order. Discharge instructions reviewed and discussed with patient. All questions and concerns answered. Copy of instructions and scripts given to patient. IV removed.  Patient escorted to car by staff in a wheelchair. No distress noted upon discharge.   Tarri Abernethy R 03/21/2014 1:53 PM

## 2014-03-21 NOTE — Progress Notes (Signed)
Occupational Therapy Treatment Patient Details Name: Sylvie Mifsud MRN: [id number] DOB: 07/22/1955 Today's Date: 03/21/2014    History of present illness 59 y.o. female admitted to [facility] on 03/19/14 for elective R direct anterior THA.  Pt with significant PMHx of arthritis (per pt bil shoulders bil knees), HTN, and right knee arthroscopy.     OT comments  Patient progressing nicely towards goals, continue plan of care for now. Patient able to use AE for LB ADLs. Encouraged patient to work on stretches to reach feet in order to perform LB ADLs without AE.  Patient's sister independent to provide assistance prn post discharge.    Follow Up Recommendations  Supervision/Assistance - 24 hour;Home health OT    Equipment Recommendations  3 in 1 bedside comode;Other (comment) (Pt states she has a tub bench and sister purchased needed AE)    Recommendations for Other Services  None at this time   Precautions / Restrictions Precautions Precautions: None Restrictions Weight Bearing Restrictions: Yes RLE Weight Bearing: Weight bearing as tolerated       Mobility Bed Mobility Overal bed mobility: Needs Assistance Bed Mobility: Supine to Sit;Rolling Rolling: Supervision     Sit to supine: Supervision   General bed mobility comments: heavily using rails, no physical assistance required  Transfers Overall transfer level: Needs assistance Equipment used: Rolling walker (2 wheeled) Transfers: Sit to/from Stand Sit to Stand: Supervision General transfer comment: Cues required for hand placement and overall safety    Balance Overall balance assessment: Needs assistance Sitting-balance support: No upper extremity supported;Feet supported Sitting balance-Leahy Scale: Good     Standing balance support: Bilateral upper extremity supported;During functional activity Standing balance-Leahy Scale: Fair    ADL   General ADL Comments: Educated patient on use of AE (reacher, sock aid, LH  sponge, LH shoe horn) in order to increase independence with LB ADLs. Patient able to use equipment with supervision in order to complete LB tasks. Patient's sister present and purchased hip kit for use at home. Patient's sister is independent to assist patient post discharge. Patient practiced tub/shower transfer using tub transfer bench as patient stated she recently got a tub bench for use in her shower. Patient required min assist for RLE management over tub.      Cognition   Behavior During Therapy: WFL for tasks assessed/performed Overall Cognitive Status: Within Functional Limits for tasks assessed                Pertinent Vitals/ Pain       Pain Assessment: Faces Pain Score: 5  Faces Pain Scale: Hurts a little bit Pain Location: right hip during mobility Pain Descriptors / Indicators: Grimacing Pain Intervention(s): Monitored during session;Repositioned         Frequency Min 2X/week     Progress Toward Goals  OT Goals(current goals can now be found in the care plan section)  Progress towards OT goals: Progressing toward goals  Acute Rehab OT Goals Patient Stated Goal: to go home, decreased pain and stiffness  Plan Discharge plan remains appropriate       End of Session Equipment Utilized During Treatment: Rolling walker   Activity Tolerance Patient tolerated treatment well   Patient Left in chair;with call bell/phone within reach;with family/visitor present     Time: 9629-5284 OT Time Calculation (min): 21 min  Charges: OT General Charges $OT Visit: 1 Procedure OT Treatments $Self Care/Home Management : 8-22 mins  Jaxsin Bottomley , MS, OTR/L, CLT Pager: [phone]  03/21/2014, 11:16 AM

## 2014-03-21 NOTE — Progress Notes (Signed)
Subjective: Pt stable - pain ok - desires dc to home   Objective: Vital signs in last 24 hours: Temp:  [97.7 F (36.5 C)-98.5 F (36.9 C)] 97.7 F (36.5 C) (03/17 0537) Pulse Rate:  [96-111] 96 (03/17 0537) Resp:  [18] 18 (03/16 1428) BP: (101-106)/(57-67) 106/67 mmHg (03/17 0537) SpO2:  [96 %-100 %] 98 % (03/17 0537)  Intake/Output from previous day: 03/16 0701 - 03/17 0700 In: 620 [P.O.:620] Out: -  Intake/Output this shift:    Exam:  Neurovascular intact Sensation intact distally Intact pulses distally  Labs:  Recent Labs  03/20/14 0640  HGB 9.5*    Recent Labs  03/20/14 0640  WBC 8.0  RBC 3.19*  HCT 29.4*  PLT 211    Recent Labs  03/20/14 0640  NA 138  K 3.9  CL 105  CO2 27  BUN 9  CREATININE 0.64  GLUCOSE 84  CALCIUM 8.8   No results for input(s): LABPT, INR in the last 72 hours.  Assessment/Plan: Plan dc today after one more pt session   Christine [name] 03/21/2014, 8:19 AM

## 2014-03-22 LAB — URINE CULTURE: Colony Count: >=100000

## 2014-03-23 ENCOUNTER — Other Ambulatory Visit: Payer: Self-pay | Admitting: Internal Medicine

## 2014-03-23 DIAGNOSIS — N3000 Acute cystitis without hematuria: Secondary | ICD-10-CM

## 2014-03-23 MED ORDER — CIPROFLOXACIN HCL 250 MG PO TABS: ORAL_TABLET | ORAL | Status: AC

## 2014-03-26 NOTE — Discharge Summary (Signed)
Physician Discharge Summary  Patient ID: Christine [name] MRN: [id number] DOB/AGE: September 16, 1955 58 y.o.  Admit date: 03/19/2014 Discharge date: 03/21/2014  Admission Diagnoses:  Degenerative arthritis of the hip  Discharge Diagnoses:  Same  Surgeries: Procedure(s): TOTAL HIP ARTHROPLASTY ANTERIOR APPROACH on 03/19/2014   Consultants:    Discharged Condition: Stable  Hospital Course: Christine [name] is an 59 y.o. female who was admitted 03/19/2014 with a chief complaint of hip pain, and found to have a diagnosis of hip arthritis.  They were brought to the operating room on 03/19/2014 and underwent the above named procedures. She tolerated the procedure well and was ambulating in the hall at the time of discharge   Antibiotics given:  Anti-infectives    Start     Dose/Rate Route Frequency Ordered Stop   03/19/14 1530  ceFAZolin (ANCEF) IVPB 2 g/50 mL premix     2 g 100 mL/hr over 30 Minutes Intravenous Every 6 hours 03/19/14 1414 03/19/14 2234   03/19/14 0600  ceFAZolin (ANCEF) IVPB 2 g/50 mL premix     2 g 100 mL/hr over 30 Minutes Intravenous On call to O.R. 03/18/14 1247 03/19/14 0745    .  Recent vital signs:  Filed Vitals:   03/21/14 0537  BP: 106/67  Pulse: 96  Temp: 97.7 F (36.5 C)  Resp:     Recent laboratory studies:  Results for orders placed or performed during the hospital encounter of 03/19/14  Urine culture  Result Value Ref Range   Specimen Description URINE, CLEAN CATCH    Special Requests NONE    Colony Count      >=100,000 COLONIES/ML Performed at [facility] Performed at [facility]    Report Status 03/22/2014 FINAL    Organism ID, Bacteria ESCHERICHIA COLI       Susceptibility   Escherichia coli - MIC*    AMPICILLIN >=32 RESISTANT Resistant     CEFAZOLIN 8 SENSITIVE Sensitive     CEFTRIAXONE <=1 SENSITIVE Sensitive     CIPROFLOXACIN <=0.25 SENSITIVE Sensitive     GENTAMICIN <=1 SENSITIVE  Sensitive     LEVOFLOXACIN <=0.12 SENSITIVE Sensitive     NITROFURANTOIN <=16 SENSITIVE Sensitive     TOBRAMYCIN <=1 SENSITIVE Sensitive     TRIMETH/SULFA <=20 SENSITIVE Sensitive     PIP/TAZO <=4 SENSITIVE Sensitive     * ESCHERICHIA COLI  Urinalysis, Routine w reflex microscopic  Result Value Ref Range   Color, Urine YELLOW YELLOW   APPearance TURBID (A) CLEAR   Specific Gravity, Urine 1.028 1.005 - 1.030   pH 5.5 5.0 - 8.0   Glucose, UA NEGATIVE NEGATIVE mg/dL   Hgb urine dipstick NEGATIVE NEGATIVE   Bilirubin Urine NEGATIVE NEGATIVE   Ketones, ur NEGATIVE NEGATIVE mg/dL   Protein, ur NEGATIVE NEGATIVE mg/dL   Urobilinogen, UA 0.2 0.0 - 1.0 mg/dL   Nitrite POSITIVE (A) NEGATIVE   Leukocytes, UA SMALL (A) NEGATIVE  Urine microscopic-add on  Result Value Ref Range   Squamous Epithelial / LPF FEW (A) RARE   WBC, UA 11-20 <3 WBC/hpf   RBC / HPF 0-2 <3 RBC/hpf   Bacteria, UA MANY (A) RARE   Crystals CA OXALATE CRYSTALS (A) NEGATIVE  CBC  Result Value Ref Range   WBC 8.0 4.0 - 10.5 K/uL   RBC 3.19 (L) 3.87 - 5.11 MIL/uL   Hemoglobin 9.5 (L) 12.0 - 15.0 g/dL   HCT 29.4 (L) 36.0 - 46.0 %  MCV 92.2 78.0 - 100.0 fL   MCH 29.8 26.0 - 34.0 pg   MCHC 32.3 30.0 - 36.0 g/dL   RDW 12.8 11.5 - 15.5 %   Platelets 211 150 - 400 K/uL  Basic metabolic panel  Result Value Ref Range   Sodium 138 135 - 145 mmol/L   Potassium 3.9 3.5 - 5.1 mmol/L   Chloride 105 96 - 112 mmol/L   CO2 27 19 - 32 mmol/L   Glucose, Bld 84 70 - 99 mg/dL   BUN 9 6 - 23 mg/dL   Creatinine, Ser 0.64 0.50 - 1.10 mg/dL   Calcium 8.8 8.4 - 10.5 mg/dL   GFR calc non Af Amer >90 >90 mL/min   GFR calc Af Amer >90 >90 mL/min   Anion gap 6 5 - 15  CBC  Result Value Ref Range   WBC 9.1 4.0 - 10.5 K/uL   RBC 3.10 (L) 3.87 - 5.11 MIL/uL   Hemoglobin 9.2 (L) 12.0 - 15.0 g/dL   HCT 28.6 (L) 36.0 - 46.0 %   MCV 92.3 78.0 - 100.0 fL   MCH 29.7 26.0 - 34.0 pg   MCHC 32.2 30.0 - 36.0 g/dL   RDW 13.1 11.5 - 15.5 %    Platelets 222 150 - 400 K/uL    Discharge Medications:     Medication List    STOP taking these medications        fluconazole 150 MG tablet  Commonly known as:  DIFLUCAN     HYDROcodone-acetaminophen 5-325 MG per tablet  Commonly known as:  NORCO  Replaced by:  HYDROcodone-acetaminophen 10-325 MG per tablet     meloxicam 15 MG tablet  Commonly known as:  MOBIC     naproxen sodium 550 MG tablet  Commonly known as:  ANAPROX DS      TAKE these medications        cholecalciferol 1000 UNITS tablet  Commonly known as:  VITAMIN D  Take 1,000 Units by mouth daily.     cyclobenzaprine 10 MG tablet  Commonly known as:  FLEXERIL  Take 1 tablet (10 mg total) by mouth 3 (three) times daily as needed for muscle spasms.     HYDROcodone-acetaminophen 10-325 MG per tablet  Commonly known as:  NORCO  Take 1-2 tablets by mouth every 4 (four) hours as needed (breakthrough pain).     KRILL OIL PO  Take 1 tablet by mouth daily.     lansoprazole 30 MG capsule  Commonly known as:  PREVACID  Take 1 capsule (30 mg total) by mouth daily.     magnesium gluconate 500 MG tablet  Commonly known as:  MAGONATE  Take 500 mg by mouth 2 (two) times daily.     rivaroxaban 10 MG Tabs tablet  Commonly known as:  XARELTO  Take 1 tablet (10 mg total) by mouth daily with breakfast.     traMADol 50 MG tablet  Commonly known as:  ULTRAM  Take 50 mg by mouth every 6 (six) hours as needed for moderate pain.     valsartan-hydrochlorothiazide 320-12.5 MG per tablet  Commonly known as:  DIOVAN-HCT  Take 1 tablet by mouth daily.        Diagnostic Studies: Dg Chest 2 View  03/06/2014   CLINICAL DATA:  Initial encounter for preoperative respiratory evaluation for total hip replacement  EXAM: CHEST  2 VIEW  COMPARISON:  None.  FINDINGS: The heart size and mediastinal contours are within normal limits. Both  lungs are clear. The visualized skeletal structures are unremarkable.  IMPRESSION: No active  cardiopulmonary disease.   Electronically Signed   By: Misty Stanley M.D.   On: 03/06/2014 16:28   Dg Pelvis Portable  03/19/2014   CLINICAL DATA:  Status post right hip replacement.  EXAM: PORTABLE PELVIS 1-2 VIEWS  COMPARISON:  Pelvis CT dated 11/12/2006.  FINDINGS: Interval right total hip prosthesis in satisfactory position and alignment. Right lower abdominal broken wire sutures.  IMPRESSION: Satisfactory postoperative appearance of a right total hip prosthesis.   Electronically Signed   By: Claudie Revering M.D.   On: 03/19/2014 13:20   Dg Hip Port Unilat With Pelvis 1v Right  03/19/2014   CLINICAL DATA:  Right hip prosthesis placement.  EXAM: RIGHT HIP (WITH PELVIS) 1 VIEW PORTABLE  COMPARISON:  Portable pelvis obtained at the same time.  FINDINGS: A portable cross-table lateral view of the right hip again demonstrates a total hip prosthesis. This appears in satisfactory position and alignment on this viewed with obscuration of a portion of the acetabular component by the overlying soft tissues. No visible fractures or dislocation.  IMPRESSION: Satisfactory appearance of a right total hip prosthesis.   Electronically Signed   By: Claudie Revering M.D.   On: 03/19/2014 13:21   Dg Hip Operative Unilat With Pelvis Right  03/19/2014   CLINICAL DATA:  Right total hip arthroplasty.  EXAM: OPERATIVE right HIP (WITH PELVIS IF PERFORMED) 3 VIEWS  TECHNIQUE: Fluoroscopic spot image(s) were submitted for interpretation post-operatively.  COMPARISON:  None.  FINDINGS: Patient is status post right total hip arthroplasty. The femoral and acetabular components appear to be well situated.  FLUOROSCOPY TIME:  43 seconds.  IMPRESSION: Status post right total hip arthroplasty.   Electronically Signed   By: Marijo Conception, M.D.   On: 03/19/2014 10:20    Disposition: 01-Home or Self Care      Discharge Instructions    Call MD / Call 911    Complete by:  As directed   If you experience chest pain or shortness of breath,  CALL 911 and be transported to the hospital emergency room.  If you develope a fever above 101 F, pus (white drainage) or increased drainage or redness at the wound, or calf pain, call your surgeon's office.     Constipation Prevention    Complete by:  As directed   Drink plenty of fluids.  Prune juice may be helpful.  You may use a stool softener, such as Colace (over the counter) 100 mg twice a day.  Use MiraLax (over the counter) for constipation as needed.     Diet - low sodium heart healthy    Complete by:  As directed      Discharge instructions    Complete by:  As directed   Ok to shower  - weight bearing as tolerated with crutches/walker Remove dressing next friday     Increase activity slowly as tolerated    Complete by:  As directed            Follow-up Information    Follow up with [facility].   Why:  They will contact you to schedule home therapy visits.   Contact information:   [address] [place] [address] [phone]        Signed: Meredith Pel 03/26/2014, 11:10 AM

## 2014-04-12 ENCOUNTER — Other Ambulatory Visit: Payer: Self-pay

## 2014-04-12 ENCOUNTER — Encounter: Payer: Self-pay | Admitting: Internal Medicine

## 2014-04-12 DIAGNOSIS — Z1231 Encounter for screening mammogram for malignant neoplasm of breast: Secondary | ICD-10-CM

## 2014-04-18 ENCOUNTER — Ambulatory Visit
Admission: RE | Admit: 2014-04-18 | Discharge: 2014-04-18 | Disposition: A | Discharge status: Home / Self Care | Payer: 59 | Source: Ambulatory Visit

## 2014-04-18 DIAGNOSIS — Z1231 Encounter for screening mammogram for malignant neoplasm of breast: Secondary | ICD-10-CM

## 2014-05-07 ENCOUNTER — Other Ambulatory Visit (HOSPITAL_COMMUNITY): Payer: Self-pay | Admitting: Orthopedic Surgery

## 2014-05-20 ENCOUNTER — Inpatient Hospital Stay (HOSPITAL_COMMUNITY): Admission: RE | Admit: 2014-05-20 | Payer: 59 | Source: Ambulatory Visit

## 2014-05-21 ENCOUNTER — Ambulatory Visit (INDEPENDENT_AMBULATORY_CARE_PROVIDER_SITE_OTHER): Payer: 59 | Admitting: Internal Medicine

## 2014-05-21 ENCOUNTER — Encounter: Payer: Self-pay | Admitting: Internal Medicine

## 2014-05-21 ENCOUNTER — Other Ambulatory Visit: Payer: Self-pay | Admitting: Internal Medicine

## 2014-05-21 VITALS — BP 128/72 | HR 88 | Temp 98.0°F | Resp 16 | Ht 62.5 in | Wt 244.0 lb

## 2014-05-21 DIAGNOSIS — Z79899 Other long term (current) drug therapy: Secondary | ICD-10-CM

## 2014-05-21 DIAGNOSIS — Z1212 Encounter for screening for malignant neoplasm of rectum: Secondary | ICD-10-CM

## 2014-05-21 DIAGNOSIS — K219 Gastro-esophageal reflux disease without esophagitis: Secondary | ICD-10-CM

## 2014-05-21 DIAGNOSIS — E559 Vitamin D deficiency, unspecified: Secondary | ICD-10-CM

## 2014-05-21 DIAGNOSIS — R5383 Other fatigue: Secondary | ICD-10-CM

## 2014-05-21 DIAGNOSIS — E785 Hyperlipidemia, unspecified: Secondary | ICD-10-CM

## 2014-05-21 DIAGNOSIS — E669 Obesity, unspecified: Secondary | ICD-10-CM

## 2014-05-21 DIAGNOSIS — R7309 Other abnormal glucose: Secondary | ICD-10-CM

## 2014-05-21 DIAGNOSIS — I10 Essential (primary) hypertension: Secondary | ICD-10-CM

## 2014-05-21 LAB — CBC WITH DIFFERENTIAL/PLATELET
BASOS ABS: 0.1 10*3/uL (ref 0.0–0.1)
Basophils Relative: 1 % (ref 0–1)
Eosinophils Absolute: 0.4 10*3/uL (ref 0.0–0.7)
Eosinophils Relative: 7 % — ABNORMAL HIGH (ref 0–5)
HCT: 38.8 % (ref 36.0–46.0)
Hemoglobin: 12.8 g/dL (ref 12.0–15.0)
Lymphocytes Relative: 35 % (ref 12–46)
Lymphs Abs: 2.2 10*3/uL (ref 0.7–4.0)
MCH: 29 pg (ref 26.0–34.0)
MCHC: 33 g/dL (ref 30.0–36.0)
MCV: 88 fL (ref 78.0–100.0)
MPV: 9.8 fL (ref 8.6–12.4)
Monocytes Absolute: 0.6 10*3/uL (ref 0.1–1.0)
Monocytes Relative: 9 % (ref 3–12)
Neutro Abs: 3 10*3/uL (ref 1.7–7.7)
Neutrophils Relative %: 48 % (ref 43–77)
Platelets: 278 10*3/uL (ref 150–400)
RBC: 4.41 MIL/uL (ref 3.87–5.11)
RDW: 14.4 % (ref 11.5–15.5)
WBC: 6.3 10*3/uL (ref 4.0–10.5)

## 2014-05-21 LAB — HEMOGLOBIN A1C
HEMOGLOBIN A1C: 5.4 % (ref <5.7)
Mean Plasma Glucose: 108 mg/dL (ref <117)

## 2014-05-21 MED ORDER — OXYBUTYNIN CHLORIDE ER 10 MG PO TB24: 10.0000 mg | ORAL_TABLET | Freq: Every day | ORAL | Status: DC

## 2014-05-21 NOTE — Patient Instructions (Signed)
Preventive Care for Adults  A healthy lifestyle and preventive care can promote health and wellness. Preventive health guidelines for women include the following key practices.  A routine yearly physical is a good way to check with your health care provider about your health and preventive screening. It is a chance to share any concerns and updates on your health and to receive a thorough exam.  Visit your dentist for a routine exam and preventive care every 6 months. Brush your teeth twice a day and floss once a day. Good oral hygiene prevents tooth decay and gum disease.  The frequency of eye exams is based on your age, health, family medical history, use of contact lenses, and other factors. Follow your health care provider's recommendations for frequency of eye exams.  Eat a healthy diet. Foods like vegetables, fruits, whole grains, low-fat dairy products, and lean protein foods contain the nutrients you need without too many calories. Decrease your intake of foods high in solid fats, added sugars, and salt. Eat the right amount of calories for you.Get information about a proper diet from your health care provider, if necessary.  Regular physical exercise is one of the most important things you can do for your health. Most adults should get at least 150 minutes of moderate-intensity exercise (any activity that increases your heart rate and causes you to sweat) each week. In addition, most adults need muscle-strengthening exercises on 2 or more days a week.  Maintain a healthy weight. The body mass index (BMI) is a screening tool to identify possible weight problems. It provides an estimate of body fat based on height and weight. Your health care provider can find your BMI and can help you achieve or maintain a healthy weight.For adults 20 years and older:  A BMI below 18.5 is considered underweight.  A BMI of 18.5 to 24.9 is normal.  A BMI of 25 to 29.9 is considered overweight.  A BMI  of 30 and above is considered obese.  Maintain normal blood lipids and cholesterol levels by exercising and minimizing your intake of saturated fat. Eat a balanced diet with plenty of fruit and vegetables. Blood tests for lipids and cholesterol should begin at age 58 and be repeated every 5 years. If your lipid or cholesterol levels are high, you are over 50, or you are at high risk for heart disease, you may need your cholesterol levels checked more frequently.Ongoing high lipid and cholesterol levels should be treated with medicines if diet and exercise are not working.  If you smoke, find out from your health care provider how to quit. If you do not use tobacco, do not start.  Lung cancer screening is recommended for adults aged 58-80 years who are at high risk for developing lung cancer because of a history of smoking. A yearly low-dose CT scan of the lungs is recommended for people who have at least a 30-pack-year history of smoking and are a current smoker or have quit within the past 15 years. A pack year of smoking is smoking an average of 1 pack of cigarettes a day for 1 year (for example: 1 pack a day for 30 years or 2 packs a day for 15 years). Yearly screening should continue until the smoker has stopped smoking for at least 15 years. Yearly screening should be stopped for people who develop a health problem that would prevent them from having lung cancer treatment.  High blood pressure causes heart disease and increases the risk of  stroke. Your blood pressure should be checked at least every 1 to 2 years. Ongoing high blood pressure should be treated with medicines if weight loss and exercise do not work.  If you are 55-79 years old, ask your health care provider if you should take aspirin to prevent strokes.  Diabetes screening involves taking a blood sample to check your fasting blood sugar level. This should be done once every 3 years, after age 45, if you are within normal weight and  without risk factors for diabetes. Testing should be considered at a younger age or be carried out more frequently if you are overweight and have at least 1 risk factor for diabetes.  Breast cancer screening is essential preventive care for women. You should practice "breast self-awareness." This means understanding the normal appearance and feel of your breasts and may include breast self-examination. Any changes detected, no matter how small, should be reported to a health care provider. Women in their 20s and 30s should have a clinical breast exam (CBE) by a health care provider as part of a regular health exam every 1 to 3 years. After age 40, women should have a CBE every year. Starting at age 40, women should consider having a mammogram (breast X-ray test) every year. Women who have a family history of breast cancer should talk to their health care provider about genetic screening. Women at a high risk of breast cancer should talk to their health care providers about having an MRI and a mammogram every year.  Breast cancer gene (BRCA)-related cancer risk assessment is recommended for women who have family members with BRCA-related cancers. BRCA-related cancers include breast, ovarian, tubal, and peritoneal cancers. Having family members with these cancers may be associated with an increased risk for harmful changes (mutations) in the breast cancer genes BRCA1 and BRCA2. Results of the assessment will determine the need for genetic counseling and BRCA1 and BRCA2 testing.  Routine pelvic exams to screen for cancer are no longer recommended for nonpregnant women who are considered low risk for cancer of the pelvic organs (ovaries, uterus, and vagina) and who do not have symptoms. Ask your health care provider if a screening pelvic exam is right for you.  If you have had past treatment for cervical cancer or a condition that could lead to cancer, you need Pap tests and screening for cancer for at least 20  years after your treatment. If Pap tests have been discontinued, your risk factors (such as having a new sexual partner) need to be reassessed to determine if screening should be resumed. Some women have medical problems that increase the chance of getting cervical cancer. In these cases, your health care provider may recommend more frequent screening and Pap tests.  Colorectal cancer can be detected and often prevented. Most routine colorectal cancer screening begins at the age of 50 years and continues through age 75 years. However, your health care provider may recommend screening at an earlier age if you have risk factors for colon cancer. On a yearly basis, your health care provider may provide home test kits to check for hidden blood in the stool. Use of a small camera at the end of a tube, to directly examine the colon (sigmoidoscopy or colonoscopy), can detect the earliest forms of colorectal cancer. Talk to your health care provider about this at age 50, when routine screening begins. Direct exam of the colon should be repeated every 5-10 years through age 75 years, unless early forms of pre-cancerous   polyps or small growths are found.  Hepatitis C blood testing is recommended for all people born from 1945 through 1965 and any individual with known risks for hepatitis C.  Pra  Osteoporosis is a disease in which the bones lose minerals and strength with aging. This can result in serious bone fractures or breaks. The risk of osteoporosis can be identified using a bone density scan. Women ages 65 years and over and women at risk for fractures or osteoporosis should discuss screening with their health care providers. Ask your health care provider whether you should take a calcium supplement or vitamin D to reduce the rate of osteoporosis.  Menopause can be associated with physical symptoms and risks. Hormone replacement therapy is available to decrease symptoms and risks. You should talk to your  health care provider about whether hormone replacement therapy is right for you.  Use sunscreen. Apply sunscreen liberally and repeatedly throughout the day. You should seek shade when your shadow is shorter than you. Protect yourself by wearing long sleeves, pants, a wide-brimmed hat, and sunglasses year round, whenever you are outdoors.  Once a month, do a whole body skin exam, using a mirror to look at the skin on your back. Tell your health care provider of new moles, moles that have irregular borders, moles that are larger than a pencil eraser, or moles that have changed in shape or color.  Stay current with required vaccines (immunizations).  Influenza vaccine. All adults should be immunized every year.  Tetanus, diphtheria, and acellular pertussis (Td, Tdap) vaccine. Pregnant women should receive 1 dose of Tdap vaccine during each pregnancy. The dose should be obtained regardless of the length of time since the last dose. Immunization is preferred during the 27th-36th week of gestation. An adult who has not previously received Tdap or who does not know her vaccine status should receive 1 dose of Tdap. This initial dose should be followed by tetanus and diphtheria toxoids (Td) booster doses every 10 years. Adults with an unknown or incomplete history of completing a 3-dose immunization series with Td-containing vaccines should begin or complete a primary immunization series including a Tdap dose. Adults should receive a Td booster every 10 years.  Varicella vaccine. An adult without evidence of immunity to varicella should receive 2 doses or a second dose if she has previously received 1 dose. Pregnant females who do not have evidence of immunity should receive the first dose after pregnancy. This first dose should be obtained before leaving the health care facility. The second dose should be obtained 4-8 weeks after the first dose.  Human papillomavirus (HPV) vaccine. Females aged 13-26 years  who have not received the vaccine previously should obtain the 3-dose series. The vaccine is not recommended for use in pregnant females. However, pregnancy testing is not needed before receiving a dose. If a female is found to be pregnant after receiving a dose, no treatment is needed. In that case, the remaining doses should be delayed until after the pregnancy. Immunization is recommended for any person with an immunocompromised condition through the age of 26 years if she did not get any or all doses earlier. During the 3-dose series, the second dose should be obtained 4-8 weeks after the first dose. The third dose should be obtained 24 weeks after the first dose and 16 weeks after the second dose.  Zoster vaccine. One dose is recommended for adults aged 60 years or older unless certain conditions are present.  Measles, mumps, and rubella (  MMR) vaccine. Adults born before [birth year] generally are considered immune to measles and mumps. Adults born in [birth year] or later should have 1 or more doses of MMR vaccine unless there is a contraindication to the vaccine or there is laboratory evidence of immunity to each of the three diseases. A routine second dose of MMR vaccine should be obtained at least 28 days after the first dose for students attending postsecondary schools, health care workers, or international travelers. People who received inactivated measles vaccine or an unknown type of measles vaccine during 1963-1967 should receive 2 doses of MMR vaccine. People who received inactivated mumps vaccine or an unknown type of mumps vaccine before 1979 and are at high risk for mumps infection should consider immunization with 2 doses of MMR vaccine. For females of childbearing age, rubella immunity should be determined. If there is no evidence of immunity, females who are not pregnant should be vaccinated. If there is no evidence of immunity, females who are pregnant should delay immunization until after pregnancy.  Unvaccinated health care workers born before [birth year] who lack laboratory evidence of measles, mumps, or rubella immunity or laboratory confirmation of disease should consider measles and mumps immunization with 2 doses of MMR vaccine or rubella immunization with 1 dose of MMR vaccine.  Pneumococcal 13-valent conjugate (PCV13) vaccine. When indicated, a person who is uncertain of her immunization history and has no record of immunization should receive the PCV13 vaccine. An adult aged 39 years or older who has certain medical conditions and has not been previously immunized should receive 1 dose of PCV13 vaccine. This PCV13 should be followed with a dose of pneumococcal polysaccharide (PPSV23) vaccine. The PPSV23 vaccine dose should be obtained at least 8 weeks after the dose of PCV13 vaccine. An adult aged 62 years or older who has certain medical conditions and previously received 1 or more doses of PPSV23 vaccine should receive 1 dose of PCV13. The PCV13 vaccine dose should be obtained 1 or more years after the last PPSV23 vaccine dose.    Pneumococcal polysaccharide (PPSV23) vaccine. When PCV13 is also indicated, PCV13 should be obtained first. All adults aged 67 years and older should be immunized. An adult younger than age 45 years who has certain medical conditions should be immunized. Any person who resides in a nursing home or long-term care facility should be immunized. An adult smoker should be immunized. People with an immunocompromised condition and certain other conditions should receive both PCV13 and PPSV23 vaccines. People with human immunodeficiency virus (HIV) infection should be immunized as soon as possible after diagnosis. Immunization during chemotherapy or radiation therapy should be avoided. Routine use of PPSV23 vaccine is not recommended for American Indians, [place] Natives, or people younger than 65 years unless there are medical conditions that require PPSV23 vaccine. When indicated,  people who have unknown immunization and have no record of immunization should receive PPSV23 vaccine. One-time revaccination 5 years after the first dose of PPSV23 is recommended for people aged 19-64 years who have chronic kidney failure, nephrotic syndrome, asplenia, or immunocompromised conditions. People who received 1-2 doses of PPSV23 before age 23 years should receive another dose of PPSV23 vaccine at age 35 years or later if at least 5 years have passed since the previous dose. Doses of PPSV23 are not needed for people immunized with PPSV23 at or after age 38 years.  Preventive Services / Frequency   Ages 43 to 86 years  Blood pressure check.  Lipid and cholesterol check.  Lung  cancer screening. / Every year if you are aged 55-80 years and have a 30-pack-year history of smoking and currently smoke or have quit within the past 15 years. Yearly screening is stopped once you have quit smoking for at least 15 years or develop a health problem that would prevent you from having lung cancer treatment.  Clinical breast exam.** / Every year after age 40 years.  BRCA-related cancer risk assessment.** / For women who have family members with a BRCA-related cancer (breast, ovarian, tubal, or peritoneal cancers).  Mammogram.** / Every year beginning at age 40 years and continuing for as long as you are in good health. Consult with your health care provider.  Pap test.** / Every 3 years starting at age 30 years through age 65 or 70 years with a history of 3 consecutive normal Pap tests.  HPV screening.** / Every 3 years from ages 30 years through ages 65 to 70 years with a history of 3 consecutive normal Pap tests.  Fecal occult blood test (FOBT) of stool. / Every year beginning at age 50 years and continuing until age 75 years. You may not need to do this test if you get a colonoscopy every 10 years.  Flexible sigmoidoscopy or colonoscopy.** / Every 5 years for a flexible sigmoidoscopy or  every 10 years for a colonoscopy beginning at age 50 years and continuing until age 75 years.  Hepatitis C blood test.** / For all people born from 1945 through 1965 and any individual with known risks for hepatitis C.  Skin self-exam. / Monthly.  Influenza vaccine. / Every year.  Tetanus, diphtheria, and acellular pertussis (Tdap/Td) vaccine.** / Consult your health care provider. Pregnant women should receive 1 dose of Tdap vaccine during each pregnancy. 1 dose of Td every 10 years.  Varicella vaccine.** / Consult your health care provider. Pregnant females who do not have evidence of immunity should receive the first dose after pregnancy.  Zoster vaccine.** / 1 dose for adults aged 60 years or older.  Pneumococcal 13-valent conjugate (PCV13) vaccine.** / Consult your health care provider.  Pneumococcal polysaccharide (PPSV23) vaccine.** / 1 to 2 doses if you smoke cigarettes or if you have certain conditions.  Meningococcal vaccine.** / Consult your health care provider.  Hepatitis A vaccine.** / Consult your health care provider.  Hepatitis B vaccine.** / Consult your health care provider. Screening for abdominal aortic aneurysm (AAA)  by ultrasound is recommended for people over 50 who have history of high blood pressure or who are current or former smokers.   

## 2014-05-21 NOTE — Progress Notes (Signed)
Patient ID: Christine [name], female   DOB: 1955/10/30, 59 y.o.   MRN: [id number]  Complete Physical  Assessment and Plan:   1. Essential hypertension  - Urinalysis, Routine w reflex microscopic - Microalbumin / creatinine urine ratio - EKG 12-Lead - [place], RETROPERITNL ABD,  LTD - TSH  2. Gastroesophageal reflux disease, esophagitis presence not specified -zantac prn -small meals avoiding trigger foods  3. Hyperlipidemia -cont med - Lipid panel  4. Obesity -diet and exercise -encouraged walking  5. Abnormal glucose -diet and exercise - Hemoglobin A1c - Insulin, random  6. Other fatigue  - Iron and TIBC - Vitamin B12  7. Medication management  - CBC with Differential/Platelet - BASIC METABOLIC PANEL WITH GFR - Hepatic function panel - Magnesium  8. Screening for rectal cancer  - POC Hemoccult Bld/Stl (3-Cd Home Screen); Future  9. Vitamin D deficiency -cont supplement - Vit D  25 hydroxy (rtn osteoporosis monitoring)    Discussed med's effects and SE's. Screening labs and tests as requested with regular follow-up as recommended.  HPI  59 y.o. female  presents for a complete physical.  Her blood pressure has been controlled at home, today their BP is BP: 128/72 mmHg.  She does workout. She denies chest pain, shortness of breath, dizziness.   She is on cholesterol medication and denies myalgias. Her cholesterol is at goal. The cholesterol last visit was:  Lab Results  Component Value Date   CHOL 159 11/07/2013   HDL 43 11/07/2013   LDLCALC 82 11/07/2013   TRIG 169* 11/07/2013   CHOLHDL 3.7 11/07/2013  .  She has been working on diet and exercise for prediabetes, she is not on bASA, she is not on ACE/ARB and denies foot ulcerations, hyperglycemia, hypoglycemia , increased appetite, nausea, paresthesia of the feet, polydipsia, polyuria, visual disturbances, vomiting and weight loss. Last A1C in the office was:  Lab Results  Component Value Date   HGBA1C  5.6 11/07/2013    Patient is on Vitamin D supplement.   Lab Results  Component Value Date   VD25OH 53 11/07/2013     She had a hip replacement has helped significantly.  She is going to have a right shoulder replacement done soon.    Current Medications:  Current Outpatient Prescriptions on File Prior to Visit  Medication Sig Dispense Refill  . acetaminophen (TYLENOL) 325 MG tablet Take 650 mg by mouth every 6 (six) hours as needed for mild pain or moderate pain.    . cholecalciferol (VITAMIN D) 1000 UNITS tablet Take 1,000 Units by mouth daily.    . cyclobenzaprine (FLEXERIL) 10 MG tablet Take 1 tablet (10 mg total) by mouth 3 (three) times daily as needed for muscle spasms. 270 tablet 3  . GuaiFENesin (MUCINEX PO) Take 400 mg by mouth daily as needed (allergies).    . lansoprazole (PREVACID) 30 MG capsule Take 1 capsule (30 mg total) by mouth daily. (Patient taking differently: Take 30 mg by mouth daily as needed (indigestion). ) 90 capsule 3  . magnesium gluconate (MAGONATE) 500 MG tablet Take 500 mg by mouth 2 (two) times daily.    . meloxicam (MOBIC) 15 MG tablet Take 15 mg by mouth daily.     . valsartan-hydrochlorothiazide (DIOVAN-HCT) 320-12.5 MG per tablet Take 1 tablet by mouth daily. 90 tablet 3   No current facility-administered medications on file prior to visit.    Health Maintenance:   Immunization History  Administered Date(s) Administered  . PPD Test 06/01/2013  Patient Care Team: [name], MD as PCP - General (Internal Medicine) Meredith Pel, MD as Consulting Physician (Orthopedic Surgery) Sable Feil, MD as Consulting Physician (Gastroenterology) Allyn Kenner, DO as Consulting Physician (Obstetrics and Gynecology) Rutherford Guys, MD as Consulting Physician (Ophthalmology) Rolm Bookbinder, MD as Consulting Physician (Dermatology)  Allergies:  Allergies  Allergen Reactions  . Codeine     REACTION: nausea/vomiting    Medical History:   Past Medical History  Diagnosis Date  . Environmental allergies   . Arthritis   . Asthma   . Hypertension   . Hyperlipidemia   . GERD (gastroesophageal reflux disease)   . Hiatal hernia     with stricture dilation  . Kidney stones 2008  . Obesity, unspecified   . PONV (postoperative nausea and vomiting)     Surgical History:  Past Surgical History  Procedure Laterality Date  . Cholecystectomy  1987  . Esophageal dilation    . Knee arthroscopy Right 2007  . 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 Throat cancer Father   . Cancer Father     throat  . Stroke Mother   . Hypertension Mother   . Hyperlipidemia Mother   . Heart disease Mother     Social History:  History  Substance Use Topics  . Smoking status: Never Smoker   . Smokeless tobacco: Not on file  . Alcohol Use: No    Review of Systems: Review of Systems  Constitutional: Negative for fever, chills and malaise/fatigue.  HENT: Negative for congestion, ear discharge, ear pain and sore throat.   Eyes: Negative.   Respiratory: Negative for cough, shortness of breath and wheezing.   Cardiovascular: Negative for chest pain, palpitations, claudication and leg swelling.  Gastrointestinal: Negative for heartburn, nausea, vomiting, diarrhea, constipation, blood in stool and melena.  Genitourinary: Positive for urgency.  Musculoskeletal: Positive for joint pain.  Skin: Negative.   Neurological: Negative for dizziness, sensory change, loss of consciousness and headaches.  Psychiatric/Behavioral: Negative for depression. The patient is not nervous/anxious and does not have insomnia.      Physical Exam: Estimated body mass index is 43.89 kg/(m^2) as calculated from the following:   Height as of  this encounter: 5' 2.5" (1.588 m).   Weight as of this encounter: 244 lb (110.678 kg). BP 128/72 mmHg  Pulse 88  Temp(Src) 98 F (36.7 C) (Temporal)  Resp 16  Ht 5' 2.5" (1.588 m)  Wt 244 lb (110.678 kg)  BMI 43.89 kg/m2  LMP 10/18/2013  General Appearance: Well nourished well developed, in no apparent distress.  Eyes: PERRLA, EOMs, conjunctiva no swelling or erythema ENT/Mouth: Ear canals normal without obstruction, swelling, erythema, or discharge.  TMs normal bilaterally with no erythema, bulging, retraction, or loss of landmark.  Oropharynx moist and clear with no exudate, erythema, or swelling.   Neck: Supple, thyroid normal. No bruits.  No cervical adenopathy Respiratory: Respiratory effort normal, Breath sounds clear A&P without wheeze, rhonchi, rales.   Cardio: RRR without murmurs, rubs or gallops. Brisk peripheral pulses without edema.  Chest: symmetric, with normal excursions Breasts: Symmetric, without lumps, nipple discharge, retractions.  Abdomen: Morbidly obese, well healing surgical scar over the RUQ, Soft, nontender, no guarding, rebound, hernias, masses, or organomegaly.  Lymphatics: Non tender without lymphadenopathy.  Genitourinary:  Musculoskeletal: Limited ROM in upper extremities 4/5 strength, and normal gait.  Skin: Warm, dry without rashes, lesions, ecchymosis. Neuro: Awake and oriented X 3, Cranial nerves intact, reflexes equal bilaterally. Normal muscle tone, no cerebellar symptoms. Sensation intact.  Psych:  normal affect, Insight and Judgment appropriate.   EKG: WNL no changes.  AORTA SCAN: WNL   Over 40 minutes of exam, counseling, chart review and critical decision making was performed  FORCUCCI, Keshara Kiger 10:55 AM [facility]

## 2014-05-22 LAB — VITAMIN B12: VITAMIN B 12: 379 pg/mL (ref 211–911)

## 2014-05-22 LAB — URINALYSIS, ROUTINE W REFLEX MICROSCOPIC
BILIRUBIN URINE: NEGATIVE
GLUCOSE, UA: NEGATIVE mg/dL
Ketones, ur: NEGATIVE mg/dL
Nitrite: NEGATIVE
PROTEIN: NEGATIVE mg/dL
Specific Gravity, Urine: <1.005 — ABNORMAL LOW (ref 1.005–1.030)
Urobilinogen, UA: 0.2 mg/dL (ref 0.0–1.0)
pH: 6.5 (ref 5.0–8.0)

## 2014-05-22 LAB — HEPATIC FUNCTION PANEL
ALBUMIN: 3.9 g/dL (ref 3.5–5.2)
ALT: 22 U/L (ref 0–35)
AST: 23 U/L (ref 0–37)
Alkaline Phosphatase: 71 U/L (ref 39–117)
BILIRUBIN DIRECT: 0.2 mg/dL (ref 0.0–0.3)
BILIRUBIN TOTAL: 0.5 mg/dL (ref 0.2–1.2)
Indirect Bilirubin: 0.3 mg/dL (ref 0.2–1.2)
Total Protein: 7.2 g/dL (ref 6.0–8.3)

## 2014-05-22 LAB — LIPID PANEL
Cholesterol: 193 mg/dL (ref 0–200)
HDL: 43 mg/dL — ABNORMAL LOW (ref >=46)
LDL CALC: 113 mg/dL — AB (ref 0–99)
Total CHOL/HDL Ratio: 4.5 Ratio
Triglycerides: 187 mg/dL — ABNORMAL HIGH (ref <150)
VLDL: 37 mg/dL (ref 0–40)

## 2014-05-22 LAB — IRON AND TIBC
%SAT: 20 % (ref 20–55)
IRON: 71 ug/dL (ref 42–145)
TIBC: 360 ug/dL (ref 250–470)
UIBC: 289 ug/dL (ref 125–400)

## 2014-05-22 LAB — BASIC METABOLIC PANEL WITH GFR
BUN: 18 mg/dL (ref 6–23)
CALCIUM: 9.4 mg/dL (ref 8.4–10.5)
CO2: 25 mEq/L (ref 19–32)
CREATININE: 0.66 mg/dL (ref 0.50–1.10)
Chloride: 103 mEq/L (ref 96–112)
GFR, Est African American: >89 mL/min
Glucose, Bld: 88 mg/dL (ref 70–99)
Potassium: 4.6 mEq/L (ref 3.5–5.3)
Sodium: 139 mEq/L (ref 135–145)

## 2014-05-22 LAB — URINALYSIS, MICROSCOPIC ONLY
BACTERIA UA: NONE SEEN
CRYSTALS: NONE SEEN
Casts: NONE SEEN

## 2014-05-22 LAB — MICROALBUMIN / CREATININE URINE RATIO
Creatinine, Urine: 51.3 mg/dL
MICROALB UR: 0.5 mg/dL (ref <2.0)
Microalb Creat Ratio: 9.7 mg/g (ref 0.0–30.0)

## 2014-05-22 LAB — TSH: TSH: 1.545 u[IU]/mL (ref 0.350–4.500)

## 2014-05-22 LAB — MAGNESIUM: Magnesium: 2 mg/dL (ref 1.5–2.5)

## 2014-05-22 LAB — INSULIN, RANDOM: Insulin: 4.3 u[IU]/mL (ref 2.0–19.6)

## 2014-05-22 LAB — VITAMIN D 25 HYDROXY (VIT D DEFICIENCY, FRACTURES): Vit D, 25-Hydroxy: 29 ng/mL — ABNORMAL LOW (ref 30–100)

## 2014-05-23 ENCOUNTER — Encounter: Payer: Self-pay | Admitting: Internal Medicine

## 2014-05-24 ENCOUNTER — Other Ambulatory Visit: Payer: Self-pay | Admitting: Internal Medicine

## 2014-05-24 LAB — URINE CULTURE

## 2014-05-24 MED ORDER — CEPHALEXIN 500 MG PO CAPS: 500.0000 mg | ORAL_CAPSULE | Freq: Three times a day (TID) | ORAL | Status: DC

## 2014-05-28 ENCOUNTER — Ambulatory Visit (INDEPENDENT_AMBULATORY_CARE_PROVIDER_SITE_OTHER): Payer: 59 | Admitting: Physician Assistant

## 2014-05-28 ENCOUNTER — Encounter: Payer: Self-pay | Admitting: Physician Assistant

## 2014-05-28 VITALS — BP 142/80 | HR 96 | Temp 97.7°F | Resp 16 | Ht 62.5 in | Wt 248.0 lb

## 2014-05-28 DIAGNOSIS — R350 Frequency of micturition: Secondary | ICD-10-CM

## 2014-05-28 DIAGNOSIS — M791 Myalgia, unspecified site: Secondary | ICD-10-CM

## 2014-05-28 LAB — CBC WITH DIFFERENTIAL/PLATELET
Basophils Absolute: 0.1 10*3/uL (ref 0.0–0.1)
Basophils Relative: 1 % (ref 0–1)
EOS ABS: 0.4 10*3/uL (ref 0.0–0.7)
Eosinophils Relative: 6 % — ABNORMAL HIGH (ref 0–5)
HEMATOCRIT: 38.9 % (ref 36.0–46.0)
Hemoglobin: 12.9 g/dL (ref 12.0–15.0)
LYMPHS ABS: 1.8 10*3/uL (ref 0.7–4.0)
Lymphocytes Relative: 26 % (ref 12–46)
MCH: 29.1 pg (ref 26.0–34.0)
MCHC: 33.2 g/dL (ref 30.0–36.0)
MCV: 87.8 fL (ref 78.0–100.0)
MONOS PCT: 7 % (ref 3–12)
MPV: 9.7 fL (ref 8.6–12.4)
Monocytes Absolute: 0.5 10*3/uL (ref 0.1–1.0)
NEUTROS ABS: 4.3 10*3/uL (ref 1.7–7.7)
Neutrophils Relative %: 60 % (ref 43–77)
PLATELETS: 312 10*3/uL (ref 150–400)
RBC: 4.43 MIL/uL (ref 3.87–5.11)
RDW: 14.8 % (ref 11.5–15.5)
WBC: 7.1 10*3/uL (ref 4.0–10.5)

## 2014-05-28 LAB — BASIC METABOLIC PANEL WITH GFR
BUN: 15 mg/dL (ref 6–23)
CHLORIDE: 104 meq/L (ref 96–112)
CO2: 25 meq/L (ref 19–32)
CREATININE: 0.71 mg/dL (ref 0.50–1.10)
Calcium: 9.7 mg/dL (ref 8.4–10.5)
GFR, Est African American: >89 mL/min
GFR, Est Non African American: >89 mL/min
GLUCOSE: 93 mg/dL (ref 70–99)
POTASSIUM: 4.1 meq/L (ref 3.5–5.3)
Sodium: 140 mEq/L (ref 135–145)

## 2014-05-28 LAB — HEPATIC FUNCTION PANEL
ALK PHOS: 72 U/L (ref 39–117)
ALT: 26 U/L (ref 0–35)
AST: 23 U/L (ref 0–37)
Albumin: 4.4 g/dL (ref 3.5–5.2)
BILIRUBIN DIRECT: 0.1 mg/dL (ref 0.0–0.3)
Indirect Bilirubin: 0.3 mg/dL (ref 0.2–1.2)
TOTAL PROTEIN: 7.2 g/dL (ref 6.0–8.3)
Total Bilirubin: 0.4 mg/dL (ref 0.2–1.2)

## 2014-05-28 NOTE — Progress Notes (Signed)
Subjective:    Patient ID: Christine [name], female    DOB: 1955-07-09, 59 y.o.   MRN: [id number]  HPI 59 y.o. WF with history of GERD, HTN, Chol, preDM, presents with worsening symptoms. She was seen 05/21/2014 for regular 3 month OV, was complaining of fatigue, had + urine and prescribed keflex on 05/24/2014. States she started to have insomnia, joint pain and swelling after starting the ABX, last dose was 9 pm last night. States that she had some right neck/ear swelling that has gotten better.   She has still had frequent urination, nocturia x 1, has had lower back pain better with rest, worse with movement. Denies fever, chills.   Blood pressure 142/80, pulse 96, temperature 97.7 F (36.5 C), resp. rate 16, height 5' 2.5" (1.588 m), weight 248 lb (112.492 kg), last menstrual period 10/18/2013.  Current Outpatient Prescriptions on File Prior to Visit  Medication Sig Dispense Refill  . acetaminophen (TYLENOL) 325 MG tablet Take 650 mg by mouth every 6 (six) hours as needed for mild pain or moderate pain.    . cephALEXin (KEFLEX) 500 MG capsule Take 1 capsule (500 mg total) by mouth 3 (three) times daily. 21 capsule 0  . cholecalciferol (VITAMIN D) 1000 UNITS tablet Take 1,000 Units by mouth daily.    . cyclobenzaprine (FLEXERIL) 10 MG tablet Take 1 tablet (10 mg total) by mouth 3 (three) times daily as needed for muscle spasms. 270 tablet 3  . GuaiFENesin (MUCINEX PO) Take 400 mg by mouth daily as needed (allergies).    . lansoprazole (PREVACID) 30 MG capsule Take 1 capsule (30 mg total) by mouth daily. (Patient taking differently: Take 30 mg by mouth daily as needed (indigestion). ) 90 capsule 3  . magnesium gluconate (MAGONATE) 500 MG tablet Take 500 mg by mouth 2 (two) times daily.    . meloxicam (MOBIC) 15 MG tablet Take 15 mg by mouth daily.     Marland Kitchen oxybutynin (DITROPAN XL) 10 MG 24 hr tablet Take 1 tablet (10 mg total) by mouth at bedtime. 30 tablet 0  . traMADol (ULTRAM) 50 MG tablet  Take by mouth every 6 (six) hours as needed.    . valsartan-hydrochlorothiazide (DIOVAN-HCT) 320-12.5 MG per tablet Take 1 tablet by mouth daily. 90 tablet 3   No current facility-administered medications on file prior to visit.   Past Medical History  Diagnosis Date  . Environmental allergies   . Arthritis   . Asthma   . Hypertension   . Hyperlipidemia   . GERD (gastroesophageal reflux disease)   . Hiatal hernia     with stricture dilation  . Kidney stones 2008  . Obesity, unspecified   . PONV (postoperative nausea and vomiting)     Review of Systems  Constitutional: Positive for fatigue. Negative for fever, chills, diaphoresis, appetite change and unexpected weight change.  HENT: Positive for ear pain. Negative for congestion, dental problem, drooling, ear discharge, facial swelling, hearing loss, mouth sores, nosebleeds, postnasal drip, rhinorrhea, sinus pressure, sneezing, sore throat, tinnitus, trouble swallowing and voice change.   Respiratory: Negative.   Cardiovascular: Negative.   Gastrointestinal: Negative.   Genitourinary: Negative.   Musculoskeletal: Positive for myalgias, back pain and arthralgias. Negative for joint swelling, gait problem, neck pain and neck stiffness.  Neurological: Negative.       Objective:   Physical Exam  Constitutional: She is oriented to person, place, and time. She appears well-developed and well-nourished. No distress.  obese  HENT:  Head: Normocephalic  and atraumatic.  Right Ear: External ear normal.  Left Ear: External ear normal.  Nose: Nose normal.  Mouth/Throat: Oropharynx is clear and moist. No oropharyngeal exudate.  Cloudy TM's bilaterally R>L  Eyes: Conjunctivae and EOM are normal. Pupils are equal, round, and reactive to light. Right eye exhibits no discharge. Left eye exhibits no discharge. No scleral icterus.  Neck: Normal range of motion. Neck supple. No JVD present. No tracheal deviation present. No thyromegaly present.   Cardiovascular: Normal rate, regular rhythm, normal heart sounds and intact distal pulses.   Pulmonary/Chest: Effort normal and breath sounds normal.  Abdominal: Soft. Bowel sounds are normal. She exhibits no distension and no mass. There is no tenderness. There is no rebound and no guarding.  Musculoskeletal: She exhibits tenderness. She exhibits no edema.  Decrease ROM bilateral shoulders with pain, + Lower back pain R>L with possible CVA tenderness  Lymphadenopathy:    She has no cervical adenopathy.  Neurological: She is alert and oriented to person, place, and time. She has normal reflexes. No cranial nerve deficit. She exhibits normal muscle tone. Coordination normal.  Skin: Skin is warm and dry. No rash noted. No erythema. No pallor.  Psychiatric: She has a normal mood and affect. Her behavior is normal. Judgment and thought content normal.  Nursing note and vitals reviewed.      Assessment & Plan:  Myalgias- no muscle weakness ? Reaction to keflex- with surgery pending we will check CBC/BMP/Liver and recheck urine Stop keflex, pending labs may add on Cipro if needed

## 2014-05-29 ENCOUNTER — Encounter (HOSPITAL_COMMUNITY)
Admission: RE | Admit: 2014-05-29 | Discharge: 2014-05-29 | Disposition: A | Discharge status: Home / Self Care | Payer: 59 | Source: Ambulatory Visit | Attending: Orthopedic Surgery | Admitting: Orthopedic Surgery

## 2014-05-29 ENCOUNTER — Encounter (HOSPITAL_COMMUNITY): Payer: Self-pay

## 2014-05-29 HISTORY — DX: Headache, unspecified: R51.9

## 2014-05-29 HISTORY — DX: Urinary tract infection, site not specified: N39.0

## 2014-05-29 HISTORY — DX: Headache: R51

## 2014-05-29 LAB — URINALYSIS, ROUTINE W REFLEX MICROSCOPIC
Bilirubin Urine: NEGATIVE
Glucose, UA: NEGATIVE mg/dL
Hgb urine dipstick: NEGATIVE
Ketones, ur: NEGATIVE mg/dL
Leukocytes, UA: NEGATIVE
NITRITE: NEGATIVE
Protein, ur: NEGATIVE mg/dL
SPECIFIC GRAVITY, URINE: 1.009 (ref 1.005–1.030)
Urobilinogen, UA: 0.2 mg/dL (ref 0.0–1.0)
pH: 6 (ref 5.0–8.0)

## 2014-05-29 LAB — TYPE AND SCREEN
ABO/RH(D): A POS
Antibody Screen: NEGATIVE

## 2014-05-29 LAB — URINE CULTURE
Colony Count: NO GROWTH
Organism ID, Bacteria: NO GROWTH

## 2014-05-29 LAB — SURGICAL PCR SCREEN
MRSA, PCR: NEGATIVE
STAPHYLOCOCCUS AUREUS: NEGATIVE

## 2014-05-29 NOTE — Progress Notes (Signed)
Pt called the Admitting Dept. After her PAT and stated that her blue armband has Dr. Alroy Dust listed when it should read Dr. Marlou Sa. Called the Blood Bank and spoke with Jana Half and she stated that the bracelet is okay because they only check the Name, MRN # and the # on the [facility] ( not the MD's name).

## 2014-05-29 NOTE — Pre-Procedure Instructions (Addendum)
Christine [name]  05/29/2014      [facility] SERVICE - [place], [address] [place] [address] Phone: [phone] Fax: [phone]    Your procedure is scheduled on   Tuesday  06/04/14  Report to [facility] Admitting at 530 A.M.  Call this number if you have problems the morning of surgery:  [phone]   Remember:  Do not eat food or drink liquids after midnight.  Take these medicines the morning of surgery with A SIP OF WATER  TYLENOL IF NEEDED OR HYDROCODONE IF NEEDED, LANSOPRAZOLE,                                                                                                          (STOP MELOXICAM/ MOBIC)  Do not wear jewelry, make-up or nail polish.  Do not wear lotions, powders, or perfumes.  You may wear deodorant.  Do not shave 48 hours prior to surgery.  Men may shave face and neck.  Do not bring valuables to the hospital.  [facility] is not responsible for any belongings or valuables.  Contacts, dentures or bridgework may not be worn into surgery.  Leave your suitcase in the car.  After surgery it may be brought to your room.  For patients admitted to the hospital, discharge time will be determined by your treatment team.  Patients discharged the day of surgery will not be allowed to drive home.   Name and phone number of your driver:    Special instructions:  [place] - Preparing for Surgery  Before surgery, you can play an important role.  Because skin is not sterile, your skin needs to be as free of germs as possible.  You can reduce the number of germs on you skin by washing with CHG (chlorahexidine gluconate) soap before surgery.  CHG is an antiseptic cleaner which kills germs and bonds with the skin to continue killing germs even after washing.  Please DO NOT use if you have an allergy to CHG or antibacterial soaps.  If your skin becomes reddened/irritated stop using the CHG and inform  your nurse when you arrive at Short Stay.  Do not shave (including legs and underarms) for at least 48 hours prior to the first CHG shower.  You may shave your face.  Please follow these instructions carefully:   1.  Shower with CHG Soap the night before surgery and the                                morning of Surgery.  2.  If you choose to wash your hair, wash your hair first as usual with your       normal shampoo.  3.  After you shampoo, rinse your hair and body thoroughly to remove the                      Shampoo.  4.  Use CHG as you would any other liquid soap.  You can apply chg directly       to the skin and wash gently with scrungie or a clean washcloth.  5.  Apply the CHG Soap to your body ONLY FROM THE NECK DOWN.        Do not use on open wounds or open sores.  Avoid contact with your eyes,       ears, mouth and genitals (private parts).  Wash genitals (private parts)       with your normal soap.  6.  Wash thoroughly, paying special attention to the area where your surgery        will be performed.  7.  Thoroughly rinse your body with warm water from the neck down.  8.  DO NOT shower/wash with your normal soap after using and rinsing off       the CHG Soap.  9.  Pat yourself dry with a clean towel.            10.  Wear clean pajamas.            11.  Place clean sheets on your bed the night of your first shower and do not        sleep with pets.  Day of Surgery  Do not apply any lotions/deoderants the morning of surgery.  Please wear clean clothes to the hospital/surgery center.    Please read over the following fact sheets that you were given. Pain Booklet, Coughing and Deep Breathing, Blood Transfusion Information, Total Joint Packet, MRSA Information and Surgical Site Infection Prevention

## 2014-05-29 NOTE — Progress Notes (Signed)
   05/29/14 1256  OBSTRUCTIVE SLEEP APNEA  Have you ever been diagnosed with sleep apnea through a sleep study? No  Do you snore loudly (loud enough to be heard through closed doors)?  1  Do you often feel tired, fatigued, or sleepy during the daytime? 0  Has anyone observed you stop breathing during your sleep? 0  Do you have, or are you being treated for high blood pressure? 1  BMI more than 35 kg/m2? 1  Age over 57 years old? 1  Neck circumference greater than 40 cm/16 inches? 0  Gender: 0

## 2014-05-29 NOTE — Progress Notes (Signed)
LEFT MESSAGE FOR DR. DEAN TN:[id number] STATING SHE RECENTLY WAS PRESCRIBED  KEFLEX FOR UTI AND HAD SWELLING TO FACE/ NECK AND STIFF JOINTS. ASKED WHAT ANTIBIOTIC PREOP DR. DEAN WOULD LIKE PATIENT TO HAVE?

## 2014-05-30 NOTE — Progress Notes (Signed)
Phone call from Dr Randel Pigg office states to change antibiotic to Vancomycin and Clindamycin per protocol. Orders placed in epic sign and held.

## 2014-06-03 MED ORDER — CHLORHEXIDINE GLUCONATE 4 % EX LIQD: 60.0000 mL | Freq: Once | CUTANEOUS | Status: DC

## 2014-06-03 NOTE — H&P (Signed)
Christine [name] is an 59 y.o. female.   Chief Complaint: right shoulder pain HPI: Christine [name] is a 59 year old female with right shoulder pain of long duration. MRI scan shows absence and retraction of supraspinatus and infraspinatus with subsequent rotator cuff arthropathy. 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 Notably she is now approximately 2-3 months out from hip replacement and is doing well with that. She denies any neck pain or numbness and tingling in the arms. No family history of DVT or pulmonary embolism.  Past Medical History  Diagnosis Date  . Environmental allergies   . Arthritis   . Asthma   . Hypertension   . Hyperlipidemia   . GERD (gastroesophageal reflux disease)   . Hiatal hernia     with stricture dilation  . Kidney stones 2008  . Obesity, unspecified   . PONV (postoperative nausea and vomiting)   . UTI (urinary tract infection)     RECENTLY TX WITH ANTIBIOTIC BY PCP  . Headache     Past Surgical History  Procedure Laterality Date  . Cholecystectomy  1987  . Esophageal dilation    . Knee arthroscopy Right 2007  . 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 Throat cancer Father   . Cancer Father     throat  . Stroke Mother   . Hypertension Mother   . Hyperlipidemia Mother   . Heart disease Mother    Social History:  reports that she has never smoked. She does not have any smokeless tobacco history on file. She reports that she does not drink alcohol or use illicit drugs.  Allergies:  Allergies  Allergen Reactions  . Keflex [Cephalexin] Anaphylaxis and Swelling  . Codeine     REACTION: nausea/vomiting    No prescriptions prior to  admission    No results found for this or any previous visit (from the past 48 hour(s)). No results found.  Review of Systems  Constitutional: Negative.   HENT: Negative.   Eyes: Negative.   Cardiovascular: Negative.   Gastrointestinal: Negative.   Genitourinary: Negative.   Musculoskeletal: Positive for joint pain.  Skin: Negative.   Neurological: Negative.   Endo/Heme/Allergies: Negative.   Psychiatric/Behavioral: Negative.     Last menstrual period 10/18/2013. Physical Exam  Constitutional: She appears well-developed.  HENT:  Head: Normocephalic.  Eyes: Pupils are equal, round, and reactive to light.  Neck: Normal range of motion.  Cardiovascular: Normal rate.   Respiratory: Effort normal.  Neurological: She is alert.  Skin: Skin is warm.  Psychiatric: She has a normal mood and affect.   right shoulder examination demonstrates restricted extra rotation to about 20 at 15 abduction isolated for flexion is around 70 isolated glenohumeral abduction is about 65. Her deltoid does function but rotator cuff strength is weak. Motor sensory function to the hand is intact. Skin is intact. Soft tissue envelope around the shoulder is large.  Assessment/Plan Impression is end-stage right shoulder rotator cuff arthropathy with significant osteophyte formation on both the humeral head and the glenoid. Patient has acetabular rosacea and of the acromion. Plan is for reverse total shoulder replacement. Risk and benefits including but not limited to infection dislocation instability nerve vessel damage  all discussed with Christine [name]. Patient understands risk benefits and wishes to proceed with surgical intervention.  DEAN,GREGORY SCOTT 06/03/2014, 8:44 AM

## 2014-06-04 ENCOUNTER — Inpatient Hospital Stay (HOSPITAL_COMMUNITY): Payer: 59

## 2014-06-04 ENCOUNTER — Inpatient Hospital Stay (HOSPITAL_COMMUNITY)
Admission: RE | Admit: 2014-06-04 | Discharge: 2014-06-06 | DRG: 483 | Disposition: A | Discharge status: Home / Self Care | Payer: 59 | Source: Ambulatory Visit | Attending: Orthopedic Surgery | Admitting: Orthopedic Surgery

## 2014-06-04 ENCOUNTER — Inpatient Hospital Stay (HOSPITAL_COMMUNITY): Payer: 59 | Admitting: Anesthesiology

## 2014-06-04 ENCOUNTER — Encounter: Payer: Self-pay | Admitting: Internal Medicine

## 2014-06-04 ENCOUNTER — Encounter (HOSPITAL_COMMUNITY): Payer: Self-pay | Admitting: *Deleted

## 2014-06-04 ENCOUNTER — Encounter (HOSPITAL_COMMUNITY)
Admission: RE | Disposition: A | Discharge status: Home / Self Care | Payer: Self-pay | Source: Ambulatory Visit | Attending: Orthopedic Surgery

## 2014-06-04 DIAGNOSIS — I10 Essential (primary) hypertension: Secondary | ICD-10-CM | POA: Diagnosis present

## 2014-06-04 DIAGNOSIS — K219 Gastro-esophageal reflux disease without esophagitis: Secondary | ICD-10-CM | POA: Diagnosis present

## 2014-06-04 DIAGNOSIS — J45909 Unspecified asthma, uncomplicated: Secondary | ICD-10-CM | POA: Diagnosis present

## 2014-06-04 DIAGNOSIS — Z885 Allergy status to narcotic agent status: Secondary | ICD-10-CM | POA: Diagnosis not present

## 2014-06-04 DIAGNOSIS — E669 Obesity, unspecified: Secondary | ICD-10-CM | POA: Diagnosis present

## 2014-06-04 DIAGNOSIS — M25711 Osteophyte, right shoulder: Secondary | ICD-10-CM | POA: Diagnosis present

## 2014-06-04 DIAGNOSIS — Z6841 Body Mass Index (BMI) 40.0 and over, adult: Secondary | ICD-10-CM

## 2014-06-04 DIAGNOSIS — E785 Hyperlipidemia, unspecified: Secondary | ICD-10-CM | POA: Diagnosis present

## 2014-06-04 DIAGNOSIS — M19011 Primary osteoarthritis, right shoulder: Secondary | ICD-10-CM | POA: Diagnosis present

## 2014-06-04 DIAGNOSIS — Z881 Allergy status to other antibiotic agents status: Secondary | ICD-10-CM | POA: Diagnosis not present

## 2014-06-04 DIAGNOSIS — Z96641 Presence of right artificial hip joint: Secondary | ICD-10-CM | POA: Diagnosis present

## 2014-06-04 DIAGNOSIS — M1990 Unspecified osteoarthritis, unspecified site: Secondary | ICD-10-CM | POA: Diagnosis present

## 2014-06-04 HISTORY — PX: REVERSE SHOULDER ARTHROPLASTY: SHX5054

## 2014-06-04 SURGERY — ARTHROPLASTY, SHOULDER, TOTAL, REVERSE
Anesthesia: Regional | Site: Shoulder | Laterality: Right

## 2014-06-04 MED ORDER — METOCLOPRAMIDE HCL 5 MG/ML IJ SOLN: 5.0000 mg | Freq: Three times a day (TID) | INTRAMUSCULAR | Status: DC | PRN

## 2014-06-04 MED ORDER — HYDROCODONE-ACETAMINOPHEN 10-325 MG PO TABS
1.0000 | ORAL_TABLET | Freq: Four times a day (QID) | ORAL | Status: DC | PRN
  Administered 2014-06-04 – 2014-06-06 (×4): 2 via ORAL
  Filled 2014-06-04 (×4): qty 2

## 2014-06-04 MED ORDER — VITAMIN D 1000 UNITS PO TABS
1000.0000 [IU] | ORAL_TABLET | Freq: Every day | ORAL | Status: DC
  Administered 2014-06-04 – 2014-06-06 (×3): 1000 [IU] via ORAL
  Filled 2014-06-04 (×2): qty 1

## 2014-06-04 MED ORDER — ASPIRIN EC 325 MG PO TBEC
325.0000 mg | DELAYED_RELEASE_TABLET | Freq: Every day | ORAL | Status: DC
  Administered 2014-06-04 – 2014-06-06 (×3): 325 mg via ORAL
  Filled 2014-06-04 (×3): qty 1

## 2014-06-04 MED ORDER — DEXAMETHASONE SODIUM PHOSPHATE 4 MG/ML IJ SOLN
INTRAMUSCULAR | Status: DC | PRN
  Administered 2014-06-04: 4 mg via INTRAVENOUS

## 2014-06-04 MED ORDER — HYDROMORPHONE HCL 1 MG/ML IJ SOLN
0.5000 mg | INTRAMUSCULAR | Status: DC | PRN
  Administered 2014-06-04 – 2014-06-05 (×2): 0.5 mg via INTRAVENOUS
  Filled 2014-06-04 (×2): qty 1

## 2014-06-04 MED ORDER — HYDROMORPHONE HCL 1 MG/ML IJ SOLN: 0.2500 mg | INTRAMUSCULAR | Status: DC | PRN

## 2014-06-04 MED ORDER — ONDANSETRON HCL 4 MG/2ML IJ SOLN
INTRAMUSCULAR | Status: DC | PRN
  Administered 2014-06-04: 4 mg via INTRAVENOUS

## 2014-06-04 MED ORDER — VANCOMYCIN HCL IN DEXTROSE 1-5 GM/200ML-% IV SOLN
1000.0000 mg | Freq: Once | INTRAVENOUS | Status: AC
  Administered 2014-06-04: 1000 mg via INTRAVENOUS
  Filled 2014-06-04: qty 200

## 2014-06-04 MED ORDER — VECURONIUM BROMIDE 10 MG IV SOLR
INTRAVENOUS | Status: DC | PRN
  Administered 2014-06-04: 4 mg via INTRAVENOUS
  Administered 2014-06-04: 2 mg via INTRAVENOUS

## 2014-06-04 MED ORDER — IRBESARTAN 300 MG PO TABS
300.0000 mg | ORAL_TABLET | Freq: Every day | ORAL | Status: DC
  Administered 2014-06-05: 300 mg via ORAL
  Filled 2014-06-04 (×2): qty 1

## 2014-06-04 MED ORDER — POTASSIUM CHLORIDE IN NACL 20-0.9 MEQ/L-% IV SOLN
INTRAVENOUS | Status: AC
  Filled 2014-06-04 (×2): qty 1000

## 2014-06-04 MED ORDER — DOCUSATE SODIUM 100 MG PO CAPS
100.0000 mg | ORAL_CAPSULE | Freq: Two times a day (BID) | ORAL | Status: DC
  Administered 2014-06-04 – 2014-06-06 (×4): 100 mg via ORAL
  Filled 2014-06-04 (×3): qty 1

## 2014-06-04 MED ORDER — ARTIFICIAL TEARS OP OINT
TOPICAL_OINTMENT | OPHTHALMIC | Status: DC | PRN
  Administered 2014-06-04: 1 via OPHTHALMIC

## 2014-06-04 MED ORDER — ACETAMINOPHEN 325 MG PO TABS: 650.0000 mg | ORAL_TABLET | Freq: Four times a day (QID) | ORAL | Status: DC | PRN

## 2014-06-04 MED ORDER — MAGNESIUM GLUCONATE 500 MG PO TABS
500.0000 mg | ORAL_TABLET | Freq: Two times a day (BID) | ORAL | Status: DC
  Administered 2014-06-04 – 2014-06-05 (×3): 500 mg via ORAL
  Filled 2014-06-04 (×6): qty 1

## 2014-06-04 MED ORDER — GUAIFENESIN ER 600 MG PO TB12: 600.0000 mg | ORAL_TABLET | Freq: Every day | ORAL | Status: DC | PRN

## 2014-06-04 MED ORDER — CALCIUM CARBONATE ANTACID 500 MG PO CHEW
2.0000 | CHEWABLE_TABLET | Freq: Once | ORAL | Status: AC
  Administered 2014-06-04: 400 mg via ORAL
  Filled 2014-06-04: qty 2

## 2014-06-04 MED ORDER — NEOSTIGMINE METHYLSULFATE 10 MG/10ML IV SOLN
INTRAVENOUS | Status: DC | PRN
  Administered 2014-06-04: 4 mg via INTRAVENOUS

## 2014-06-04 MED ORDER — 0.9 % SODIUM CHLORIDE (POUR BTL) OPTIME
TOPICAL | Status: DC | PRN
  Administered 2014-06-04: 1000 mL

## 2014-06-04 MED ORDER — SCOPOLAMINE 1 MG/3DAYS TD PT72
MEDICATED_PATCH | TRANSDERMAL | Status: DC | PRN
  Administered 2014-06-04: 1 via TRANSDERMAL

## 2014-06-04 MED ORDER — PHENYLEPHRINE HCL 10 MG/ML IJ SOLN
10.0000 mg | INTRAVENOUS | Status: DC | PRN
  Administered 2014-06-04: 20 ug/min via INTRAVENOUS

## 2014-06-04 MED ORDER — PROPOFOL 10 MG/ML IV BOLUS
INTRAVENOUS | Status: AC
  Filled 2014-06-04: qty 20

## 2014-06-04 MED ORDER — SCOPOLAMINE 1 MG/3DAYS TD PT72
MEDICATED_PATCH | TRANSDERMAL | Status: AC
  Filled 2014-06-04: qty 1

## 2014-06-04 MED ORDER — MELOXICAM 15 MG PO TABS
15.0000 mg | ORAL_TABLET | Freq: Every day | ORAL | Status: DC
  Administered 2014-06-04 – 2014-06-06 (×3): 15 mg via ORAL
  Filled 2014-06-04 (×4): qty 1

## 2014-06-04 MED ORDER — ROPIVACAINE HCL 5 MG/ML IJ SOLN
INTRAMUSCULAR | Status: DC | PRN
  Administered 2014-06-04: 30 mL via PERINEURAL

## 2014-06-04 MED ORDER — TRAMADOL HCL 50 MG PO TABS
50.0000 mg | ORAL_TABLET | Freq: Four times a day (QID) | ORAL | Status: DC | PRN
  Administered 2014-06-04 – 2014-06-06 (×5): 50 mg via ORAL
  Filled 2014-06-04 (×5): qty 1

## 2014-06-04 MED ORDER — DEXTROSE 5 % IV SOLN
900.0000 mg | INTRAVENOUS | Status: AC
  Administered 2014-06-04: 900 mg via INTRAVENOUS
  Filled 2014-06-04: qty 6

## 2014-06-04 MED ORDER — METOCLOPRAMIDE HCL 5 MG PO TABS: 5.0000 mg | ORAL_TABLET | Freq: Three times a day (TID) | ORAL | Status: DC | PRN

## 2014-06-04 MED ORDER — MIDAZOLAM HCL 2 MG/2ML IJ SOLN
INTRAMUSCULAR | Status: AC
  Filled 2014-06-04: qty 2

## 2014-06-04 MED ORDER — EPHEDRINE SULFATE 50 MG/ML IJ SOLN
INTRAMUSCULAR | Status: DC | PRN
  Administered 2014-06-04 (×3): 5 mg via INTRAVENOUS

## 2014-06-04 MED ORDER — PANTOPRAZOLE SODIUM 20 MG PO TBEC
20.0000 mg | DELAYED_RELEASE_TABLET | Freq: Every day | ORAL | Status: DC
  Administered 2014-06-04 – 2014-06-05 (×2): 20 mg via ORAL
  Filled 2014-06-04 (×4): qty 1

## 2014-06-04 MED ORDER — CALCIUM CARBONATE ANTACID 420 MG PO CHEW
840.0000 mg | CHEWABLE_TABLET | Freq: Once | ORAL | Status: DC
  Filled 2014-06-04 (×2): qty 2

## 2014-06-04 MED ORDER — PHENOL 1.4 % MT LIQD: 1.0000 | OROMUCOSAL | Status: DC | PRN

## 2014-06-04 MED ORDER — CYCLOBENZAPRINE HCL 10 MG PO TABS
ORAL_TABLET | ORAL | Status: AC
  Filled 2014-06-04: qty 1

## 2014-06-04 MED ORDER — FENTANYL CITRATE (PF) 100 MCG/2ML IJ SOLN
INTRAMUSCULAR | Status: DC | PRN
  Administered 2014-06-04 (×3): 25 ug via INTRAVENOUS
  Administered 2014-06-04: 50 ug via INTRAVENOUS
  Administered 2014-06-04: 25 ug via INTRAVENOUS

## 2014-06-04 MED ORDER — OXYBUTYNIN CHLORIDE ER 10 MG PO TB24
10.0000 mg | ORAL_TABLET | Freq: Every day | ORAL | Status: DC
  Administered 2014-06-04 – 2014-06-05 (×2): 10 mg via ORAL
  Filled 2014-06-04 (×2): qty 1

## 2014-06-04 MED ORDER — ACETAMINOPHEN 650 MG RE SUPP: 650.0000 mg | Freq: Four times a day (QID) | RECTAL | Status: DC | PRN

## 2014-06-04 MED ORDER — VALSARTAN-HYDROCHLOROTHIAZIDE 320-12.5 MG PO TABS: 1.0000 | ORAL_TABLET | Freq: Every day | ORAL | Status: DC

## 2014-06-04 MED ORDER — LACTATED RINGERS IV SOLN
INTRAVENOUS | Status: DC | PRN
  Administered 2014-06-04 (×3): via INTRAVENOUS

## 2014-06-04 MED ORDER — LIDOCAINE HCL (CARDIAC) 20 MG/ML IV SOLN
INTRAVENOUS | Status: DC | PRN
  Administered 2014-06-04: 20 mg via INTRAVENOUS

## 2014-06-04 MED ORDER — MENTHOL 3 MG MT LOZG: 1.0000 | LOZENGE | OROMUCOSAL | Status: DC | PRN

## 2014-06-04 MED ORDER — GLYCOPYRROLATE 0.2 MG/ML IJ SOLN
INTRAMUSCULAR | Status: DC | PRN
  Administered 2014-06-04: 0.6 mg via INTRAVENOUS

## 2014-06-04 MED ORDER — MIDAZOLAM HCL 5 MG/5ML IJ SOLN
INTRAMUSCULAR | Status: DC | PRN
  Administered 2014-06-04: 2 mg via INTRAVENOUS

## 2014-06-04 MED ORDER — PROPOFOL 10 MG/ML IV BOLUS
INTRAVENOUS | Status: DC | PRN
  Administered 2014-06-04: 140 mg via INTRAVENOUS

## 2014-06-04 MED ORDER — FENTANYL CITRATE (PF) 250 MCG/5ML IJ SOLN
INTRAMUSCULAR | Status: AC
  Filled 2014-06-04: qty 5

## 2014-06-04 MED ORDER — ONDANSETRON HCL 4 MG/2ML IJ SOLN: 4.0000 mg | Freq: Four times a day (QID) | INTRAMUSCULAR | Status: DC | PRN

## 2014-06-04 MED ORDER — HYDROCHLOROTHIAZIDE 12.5 MG PO CAPS
12.5000 mg | ORAL_CAPSULE | Freq: Every day | ORAL | Status: DC
  Administered 2014-06-05: 12.5 mg via ORAL
  Filled 2014-06-04 (×2): qty 1

## 2014-06-04 MED ORDER — ONDANSETRON HCL 4 MG PO TABS: 4.0000 mg | ORAL_TABLET | Freq: Four times a day (QID) | ORAL | Status: DC | PRN

## 2014-06-04 MED ORDER — VANCOMYCIN HCL IN DEXTROSE 1-5 GM/200ML-% IV SOLN
1000.0000 mg | Freq: Two times a day (BID) | INTRAVENOUS | Status: AC
  Administered 2014-06-04: 1000 mg via INTRAVENOUS
  Filled 2014-06-04: qty 200

## 2014-06-04 MED ORDER — PROMETHAZINE HCL 25 MG/ML IJ SOLN
INTRAMUSCULAR | Status: AC
  Filled 2014-06-04: qty 1

## 2014-06-04 MED ORDER — PROMETHAZINE HCL 25 MG/ML IJ SOLN
6.2500 mg | Freq: Once | INTRAMUSCULAR | Status: AC
  Administered 2014-06-04: 6.25 mg via INTRAVENOUS

## 2014-06-04 MED ORDER — CLINDAMYCIN PHOSPHATE 600 MG/50ML IV SOLN
600.0000 mg | Freq: Three times a day (TID) | INTRAVENOUS | Status: AC
  Administered 2014-06-04 – 2014-06-05 (×3): 600 mg via INTRAVENOUS
  Filled 2014-06-04 (×3): qty 50

## 2014-06-04 MED ORDER — SUCCINYLCHOLINE CHLORIDE 20 MG/ML IJ SOLN
INTRAMUSCULAR | Status: DC | PRN
  Administered 2014-06-04: 100 mg via INTRAVENOUS

## 2014-06-04 MED ORDER — VANCOMYCIN HCL IN DEXTROSE 1-5 GM/200ML-% IV SOLN
1000.0000 mg | INTRAVENOUS | Status: AC
  Administered 2014-06-04: 1000 mg via INTRAVENOUS
  Filled 2014-06-04: qty 200

## 2014-06-04 MED ORDER — LIDOCAINE HCL 4 % MT SOLN
OROMUCOSAL | Status: DC | PRN
  Administered 2014-06-04: 4 mL via TOPICAL

## 2014-06-04 MED ORDER — CYCLOBENZAPRINE HCL 10 MG PO TABS
10.0000 mg | ORAL_TABLET | Freq: Three times a day (TID) | ORAL | Status: DC | PRN
  Administered 2014-06-04 – 2014-06-06 (×4): 10 mg via ORAL
  Filled 2014-06-04 (×3): qty 1

## 2014-06-04 SURGICAL SUPPLY — 68 items
BASEPLATE GLENOID SHLDR SM (Shoulder) ×2 IMPLANT
BLADE SAW SGTL 13X75X1.27 (BLADE) ×2 IMPLANT
BUR MATCHSTICK NEURO 3.0 LAGG (BURR) ×2 IMPLANT
CEMENT HV SMART SET (Cement) ×2 IMPLANT
CLSR STERI-STRIP ANTIMIC 1/2X4 (GAUZE/BANDAGES/DRESSINGS) ×2 IMPLANT
COVER SURGICAL LIGHT HANDLE (MISCELLANEOUS) ×2 IMPLANT
COVER TABLE BACK 60X90 (DRAPES) IMPLANT
DRAPE C-ARM 42X72 X-RAY (DRAPES) IMPLANT
DRAPE IMP U-DRAPE 54X76 (DRAPES) ×2 IMPLANT
DRAPE INCISE IOBAN 66X45 STRL (DRAPES) ×4 IMPLANT
DRAPE U-SHAPE 47X51 STRL (DRAPES) ×4 IMPLANT
DRSG AQUACEL AG ADV 3.5X10 (GAUZE/BANDAGES/DRESSINGS) ×2 IMPLANT
DRSG PAD ABDOMINAL 8X10 ST (GAUZE/BANDAGES/DRESSINGS) ×4 IMPLANT
DURAPREP 26ML APPLICATOR (WOUND CARE) ×2 IMPLANT
ELECT BLADE 6.5 EXT (BLADE) IMPLANT
ELECT REM PT RETURN 9FT ADLT (ELECTROSURGICAL) ×2
ELECTRODE REM PT RTRN 9FT ADLT (ELECTROSURGICAL) ×1 IMPLANT
EVACUATOR 1/8 PVC DRAIN (DRAIN) IMPLANT
GAUZE SPONGE 4X4 12PLY STRL (GAUZE/BANDAGES/DRESSINGS) ×2 IMPLANT
GLENOSPHERE LATERAL 36MM+4 (Shoulder) ×2 IMPLANT
GLOVE BIOGEL PI IND STRL 7.0 (GLOVE) ×3 IMPLANT
GLOVE BIOGEL PI IND STRL 7.5 (GLOVE) ×1 IMPLANT
GLOVE BIOGEL PI IND STRL 8 (GLOVE) ×1 IMPLANT
GLOVE BIOGEL PI INDICATOR 7.0 (GLOVE) ×3
GLOVE BIOGEL PI INDICATOR 7.5 (GLOVE) ×1
GLOVE BIOGEL PI INDICATOR 8 (GLOVE) ×1
GLOVE ECLIPSE 7.0 STRL STRAW (GLOVE) ×4 IMPLANT
GLOVE SURG ORTHO 8.0 STRL STRW (GLOVE) ×2 IMPLANT
GLOVE SURG SS PI 6.5 STRL IVOR (GLOVE) ×6 IMPLANT
GOWN STRL REUS W/ TWL LRG LVL3 (GOWN DISPOSABLE) ×4 IMPLANT
GOWN STRL REUS W/ TWL XL LVL3 (GOWN DISPOSABLE) ×1 IMPLANT
GOWN STRL REUS W/TWL LRG LVL3 (GOWN DISPOSABLE) ×8
GOWN STRL REUS W/TWL XL LVL3 (GOWN DISPOSABLE) ×2
HOOD SURGICAL BLUE (PROTECTIVE WEAR) ×10 IMPLANT
KIT BASIN OR (CUSTOM PROCEDURE TRAY) ×2 IMPLANT
KIT BEACH CHAIR TRIMANO (Kit) ×2 IMPLANT
KIT ROOM TURNOVER OR (KITS) ×2 IMPLANT
LINER HUMERAL 36 +3MM SM (Shoulder) ×2 IMPLANT
MANIFOLD NEPTUNE II (INSTRUMENTS) ×2 IMPLANT
NDL SUT 6 .5 CRC .975X.05 MAYO (NEEDLE) ×2 IMPLANT
NEEDLE HYPO 25GX1X1/2 BEV (NEEDLE) IMPLANT
NEEDLE MAYO TAPER (NEEDLE) ×4
NS IRRIG 1000ML POUR BTL (IV SOLUTION) ×2 IMPLANT
PACK SHOULDER (CUSTOM PROCEDURE TRAY) ×2 IMPLANT
PACK UNIVERSAL I (CUSTOM PROCEDURE TRAY) IMPLANT
PAD ARMBOARD 7.5X6 YLW CONV (MISCELLANEOUS) ×4 IMPLANT
PERIPHERAL LOCKING SCREW 24MM ×4 IMPLANT
PIN SET, UNIVERSAL REPAIR ×2 IMPLANT
SCREW CENTRAL NONLOCK 6.5X20MM (Shoulder) ×2 IMPLANT
SET PIN UNIVERSAL REVERSE (SET/KITS/TRAYS/PACK) ×2 IMPLANT
SPACER SHLD UNI REV 36 +6 (Shoulder) ×2 IMPLANT
SPONGE LAP 18X18 X RAY DECT (DISPOSABLE) ×2 IMPLANT
STEM REVERSE SIZE 5-135 (Shoulder) ×2 IMPLANT
SUCTION FRAZIER TIP 10 FR DISP (SUCTIONS) ×2 IMPLANT
SUT FIBERWIRE #2 38 T-5 BLUE (SUTURE) ×4
SUT MAXBRAID (SUTURE) IMPLANT
SUT PROLENE 3 0 PS 2 (SUTURE) ×2 IMPLANT
SUT VIC AB 0 CTB1 27 (SUTURE) ×4 IMPLANT
SUT VIC AB 1 CT1 27 (SUTURE) ×1
SUT VIC AB 1 CT1 27XBRD ANBCTR (SUTURE) ×1 IMPLANT
SUT VIC AB 2-0 CT1 27 (SUTURE) ×6
SUT VIC AB 2-0 CT1 TAPERPNT 27 (SUTURE) ×3 IMPLANT
SUTURE FIBERWR #2 38 T-5 BLUE (SUTURE) ×2 IMPLANT
SYR CONTROL 10ML LL (SYRINGE) ×2 IMPLANT
TOWEL OR 17X24 6PK STRL BLUE (TOWEL DISPOSABLE) ×2 IMPLANT
TOWEL OR 17X26 10 PK STRL BLUE (TOWEL DISPOSABLE) ×2 IMPLANT
TRAY FOLEY BAG SILVER LF 16FR (CATHETERS) ×2 IMPLANT
WATER STERILE IRR 1000ML POUR (IV SOLUTION) ×2 IMPLANT

## 2014-06-04 NOTE — Progress Notes (Signed)
Pt c/o "pressure" at right upper chest, near shoulder surgical site. Nausea is better, and I have given here po Flexeril. NSR 90's, BP 101-112/ 50's; 96 % O2 sat on 3 L [place]. She says her color is normally pale. Dr Jefm Petty fully updated. He will come by to see pt. Will cont to monitor.

## 2014-06-04 NOTE — Progress Notes (Signed)
Pt feels "pressure" sensation has eased up. VSS

## 2014-06-04 NOTE — Progress Notes (Signed)
Dr Ola Spurr here to see pt. New ord for antacid.

## 2014-06-04 NOTE — Anesthesia Procedure Notes (Addendum)
Anesthesia Regional Block:  Interscalene brachial plexus block  Pre-Anesthetic Checklist: ,, timeout performed, Correct Patient, Correct Site, Correct Laterality, Correct Procedure, Correct Position, site marked, Risks and benefits discussed, pre-op evaluation,  At surgeon's request and post-op pain management  Laterality: Right  Prep: Maximum Sterile Barrier Precautions used and chloraprep       Needles:  Injection technique: Single-shot  Needle Type: Echogenic Stimulator Needle     Needle Length: 5cm 5 cm Needle Gauge: 22 and 22 G    Additional Needles:  Procedures: ultrasound guided (picture in chart) and nerve stimulator Interscalene brachial plexus block  Nerve Stimulator or Paresthesia:  Response: Biceps response,   Additional Responses:   Narrative:  Start time: 06/04/2014 7:00 AM End time: 06/04/2014 7:10 AM Injection made incrementally with aspirations every 5 mL. Anesthesiologist: Roderic Palau  Additional Notes: 2% Lidocaine skin wheel.    Procedure Name: Intubation Date/Time: 06/04/2014 7:31 AM Performed by: Suzy Bouchard Pre-anesthesia Checklist: Patient identified, Emergency Drugs available, Suction available, Patient being monitored and Timeout performed Patient Re-evaluated:Patient Re-evaluated prior to inductionOxygen Delivery Method: Circle system utilized Preoxygenation: Pre-oxygenation with 100% oxygen Intubation Type: IV induction Ventilation: Mask ventilation without difficulty Laryngoscope Size: Miller and 2 Grade View: Grade I Tube type: Oral Tube size: 7.0 mm Number of attempts: 1 Airway Equipment and Method: Stylet and LTA kit utilized Placement Confirmation: positive ETCO2,  ETT inserted through vocal cords under direct vision and breath sounds checked- equal and bilateral Secured at: 21 cm Tube secured with: Tape Dental Injury: Teeth and Oropharynx as per pre-operative assessment

## 2014-06-04 NOTE — Anesthesia Postprocedure Evaluation (Signed)
  Anesthesia Post-op Note  Patient: Christine [name]  Procedure(s) Performed: Procedure(s): REVERSE SHOULDER ARTHROPLASTY (Right)  Patient Location: PACU  Anesthesia Type:General and block  Level of Consciousness: awake and alert   Airway and Oxygen Therapy: Patient Spontanous Breathing  Post-op Pain: none  Post-op Assessment: Post-op Vital signs reviewed, Patient's Cardiovascular Status Stable and Respiratory Function Stable  Post-op Vital Signs: Reviewed  Filed Vitals:   06/04/14 1323  BP:   Pulse: 93  Temp:   Resp: 20    Complications: No apparent anesthesia complications

## 2014-06-04 NOTE — Interval H&P Note (Signed)
History and Physical Interval Note:  06/04/2014 1:32 AM  Christine [name]  has presented today for surgery, with the diagnosis of RIGHT SHOULDER ROTATOR CUFF ATHROPATHY  The various methods of treatment have been discussed with the patient and family. After consideration of risks, benefits and other options for treatment, the patient has consented to  Procedure(s): REVERSE SHOULDER ARTHROPLASTY (Right) as a surgical intervention .  The patient's history has been reviewed, patient examined, no change in status, stable for surgery.  I have reviewed the patient's chart and labs.  Questions were answered to the patient's satisfaction.     [name] SCOTT

## 2014-06-04 NOTE — Transfer of Care (Signed)
Immediate Anesthesia Transfer of Care Note  Patient: Christine [name]  Procedure(s) Performed: Procedure(s): REVERSE SHOULDER ARTHROPLASTY (Right)  Patient Location: PACU  Anesthesia Type:GA combined with regional for post-op pain  Level of Consciousness: sedated  Airway & Oxygen Therapy: Patient Spontanous Breathing and Patient connected to nasal cannula oxygen  Post-op Assessment: Report given to RN and Post -op Vital signs reviewed and stable  Post vital signs: Reviewed and stable  Last Vitals:  Filed Vitals:   06/04/14 0542  BP: 145/75  Pulse: 92  Temp: 37.4 C  Resp: 20    Complications: No apparent anesthesia complications

## 2014-06-04 NOTE — Care Management Note (Signed)
Case Management Note  Patient Details  Name: [name] MRN: [id number] Date of Birth: 1955-10-21  Subjective/Objective:    Patient is from home.  S/P REVERSE RIGHT SHOULDER ARTHROPLASTY         Action/Plan: AWAITS PT EVAL  Expected Discharge Date:       06/06/14           Expected Discharge Plan:     In-House Referral:     Discharge planning Services     Post Acute Care Choice:    Choice offered to:     DME Arranged:    DME Agency:     HH Arranged:    HH Agency:     Status of Service:     Medicare Important Message Given:    Date Medicare IM Given:    Medicare IM give by:    Date Additional Medicare IM Given:    Additional Medicare Important Message give by:     If discussed at [place] of Stay Meetings, dates discussed:    Additional Comments:  Dimas Aguas, RN 06/04/2014, 7:37 PM

## 2014-06-04 NOTE — Anesthesia Preprocedure Evaluation (Addendum)
Anesthesia Evaluation  Patient identified by MRN, date of birth, ID band  Reviewed: Allergy & Precautions, H&P , NPO status , Patient's Chart, lab work & pertinent test results  History of Anesthesia Complications (+) PONV  Airway Mallampati: II  TM Distance: >3 FB Neck ROM: Full    Dental no notable dental hx. (+) Teeth Intact, Dental Advisory Given   Pulmonary asthma ,  breath sounds clear to auscultation  Pulmonary exam normal       Cardiovascular hypertension, Pt. on medications Rhythm:Regular Rate:Normal     Neuro/Psych  Headaches, negative psych ROS   GI/Hepatic Neg liver ROS, GERD-  Medicated and Controlled,  Endo/Other  negative endocrine ROS  Renal/GU Renal disease  negative genitourinary   Musculoskeletal  (+) Arthritis -, Osteoarthritis,    Abdominal   Peds  Hematology negative hematology ROS (+)   Anesthesia Other Findings   Reproductive/Obstetrics negative OB ROS                            Anesthesia Physical Anesthesia Plan  ASA: III  Anesthesia Plan: General and Regional   Post-op Pain Management:    Induction: Intravenous  Airway Management Planned: Oral ETT  Additional Equipment:   Intra-op Plan:   Post-operative Plan: Extubation in OR  Informed Consent: I have reviewed the patients History and Physical, chart, labs and discussed the procedure including the risks, benefits and alternatives for the proposed anesthesia with the patient or authorized representative who has indicated his/her understanding and acceptance.   Dental advisory given  Plan Discussed with: CRNA  Anesthesia Plan Comments:         Anesthesia Quick Evaluation

## 2014-06-04 NOTE — Progress Notes (Signed)
ANTIBIOTIC CONSULT NOTE - INITIAL  Pharmacy Consult for vanc and clinda Indication: surgical prophylaxis  Allergies  Allergen Reactions  . Keflex [Cephalexin] Anaphylaxis and Swelling  . Codeine     REACTION: nausea/vomiting    Patient Measurements: Weight: 244 lb (110.678 kg)   Vital Signs: Temp: 97.8 F (36.6 C) (05/31 1413) Temp Source: Oral (05/31 0542) BP: 111/56 mmHg (05/31 1401) Pulse Rate: 91 (05/31 1413) Intake/Output from previous day:   Intake/Output from this shift: Total I/O In: 2600 [P.O.:50; I.V.:2550] Out: 1325 [Urine:975; Blood:350]  Labs: No results for input(s): WBC, HGB, PLT, LABCREA, CREATININE in the last 72 hours. Estimated Creatinine Clearance: 92 mL/min (by C-G formula based on Cr of 0.71). No results for input(s): VANCOTROUGH, VANCOPEAK, VANCORANDOM, GENTTROUGH, GENTPEAK, GENTRANDOM, TOBRATROUGH, TOBRAPEAK, TOBRARND, AMIKACINPEAK, AMIKACINTROU, AMIKACIN in the last 72 hours.   Microbiology: Recent Results (from the past 720 hour(s))  Urine culture     Status: None   Collection Time: 05/21/14 11:27 AM  Result Value Ref Range Status   Colony Count >=100,000 COLONIES/ML  Final   Organism ID, Bacteria Multiple bacterial morphotypes present, none  Final   Organism ID, Bacteria predominant. Suggest appropriate recollection if   Final   Organism ID, Bacteria clinically indicated.  Final  Urine culture     Status: None   Collection Time: 05/28/14 10:21 AM  Result Value Ref Range Status   Colony Count NO GROWTH  Final   Organism ID, Bacteria NO GROWTH  Final  Surgical pcr screen     Status: None   Collection Time: 05/29/14  1:22 PM  Result Value Ref Range Status   MRSA, PCR NEGATIVE NEGATIVE Final   Staphylococcus aureus NEGATIVE NEGATIVE Final    Comment:        The Xpert SA Assay (FDA approved for NASAL specimens in patients over 36 years of age), is one component of a comprehensive surveillance program.  Test performance has been  validated by [facility] for patients greater than or equal to 51 year old. It is not intended to diagnose infection nor to guide or monitor treatment.     Medical History: Past Medical History  Diagnosis Date  . Environmental allergies   . Arthritis   . Asthma   . Hypertension   . Hyperlipidemia   . GERD (gastroesophageal reflux disease)   . Hiatal hernia     with stricture dilation  . Kidney stones 2008  . Obesity, unspecified   . PONV (postoperative nausea and vomiting)   . UTI (urinary tract infection)     RECENTLY TX WITH ANTIBIOTIC BY PCP  . Headache     Assessment: 59 yo F s/p shoulder repair.  Pharmacy consulted to dose vanc and clinda for surgical prophylaxis. Wt 110.7 kg, preop vanc 1 gm and clinda 900 at 0720 am. Creat 0.71, AF  Goal of Therapy:  Surgical prophylaxis  Plan:  -vancomycin 1 gm IV x 1 at 2000 -clinda 600 IV q8h x 3 doses Pharmacy to sign off Thanks Eudelia Bunch, Pharm.D. [phone] 06/04/2014 2:42 PM

## 2014-06-04 NOTE — Progress Notes (Signed)
06/04/14 1937  CM spoke with patient in her room.  Patient is schedule to have PT tomorrow to determine needs and discharge plan.

## 2014-06-04 NOTE — Brief Op Note (Signed)
06/04/2014  12:03 PM  PATIENT:  Christine [name]  59 y.o. female  PRE-OPERATIVE DIAGNOSIS:  RIGHT SHOULDER ROTATOR CUFF ATHROPATHY  POST-OPERATIVE DIAGNOSIS:  RIGHT SHOULDER ROTATOR CUFF ATHROPATHY  PROCEDURE:  Procedure(s): REVERSE SHOULDER ARTHROPLASTY  SURGEON:  Surgeon(s): Meredith Pel, MD  ASSISTANT: carla bethune rnfa  ANESTHESIA:   regional and general  EBL: 350 ml    Total I/O In: 2000 [I.V.:2000] Out: 1200 [Urine:850; Blood:350]  BLOOD ADMINISTERED: none  DRAINS: none   LOCAL MEDICATIONS USED:  none  SPECIMEN:  No Specimen  COUNTS:  YES  TOURNIQUET:  * No tourniquets in log *  DICTATION: .Other Dictation: Dictation Number [id number]  PLAN OF CARE: Admit to inpatient   PATIENT DISPOSITION:  PACU - hemodynamically stable

## 2014-06-05 ENCOUNTER — Encounter: Payer: 59 | Admitting: Internal Medicine

## 2014-06-05 ENCOUNTER — Encounter (HOSPITAL_COMMUNITY): Payer: Self-pay | Admitting: Orthopedic Surgery

## 2014-06-05 NOTE — Op Note (Signed)
Christine [name], Christine [name]             ACCOUNT NO.:  [id number]  MEDICAL RECORD NO.:  [id number]  LOCATION:  [id number]                        FACILITY:  [facility]  PHYSICIAN:  Anderson Malta, M.D.    DATE OF BIRTH:  Oct 29, 1955  DATE OF PROCEDURE:  06/04/2014 DATE OF DISCHARGE:                              OPERATIVE REPORT   PREOPERATIVE DIAGNOSIS:  Right shoulder rotator cuff arthropathy and arthritis.  POSTOPERATIVE DIAGNOSIS:  Right shoulder rotator cuff arthropathy and arthritis.  PROCEDURE:  Right shoulder reverse shoulder replacement.  SURGEON:  Anderson Malta, M.D.  ASSISTANT:  Laure Kidney, RNFA.  INDICATIONS:  Christine [name] is a 59 year old patient with severe end- stage right shoulder arthritis and rotator cuff arthropathy.  She presents for operative management after explanation of risks and benefits.  COMPONENTS IMPLANTED:  Arthrex Universal glenoid base plate small uncemented with 2 superior and inferior locking screws and each measuring 24 mm and one central nonlocking bicortical captured screw measuring 6.5 x 20 mm, glenosphere 36+ 4 lateral, stem size 5 mono block 135 degrees with a 36 small +9 liner.  PROCEDURE IN DETAIL:  The patient was brought to the operating room where general endotracheal anesthesia was induced.  Preoperative antibiotics were administered.  Time-out was called.  Right arm was prescrubbed with alcohol and Betadine allowed to air dry.  Prepped with DuraPrep solution, draped in a sterile manner.  SCDs were placed.  The patient was placed in a beach-chair position with the head in neutral position.  Trimano arm positioner was utilized.  Time-out was called. Following sterile prepping and draping the operative field, it was covered with Ioban strips.  The patient had pretty significant external rotation deficit, going only about 10 to 15 degrees.  Isolated glenohumeral abduction with the patient's sweep it was only about 40 degrees, forward flexion  was only also about 40 to 45 degrees.  At this time, deltopectoral approach was made beginning the incision at the inferior border of the clavicle going into the lateral aspect the coracoid process down to a location just 2 cm lateral to the axillary crease.  Skin and subcutaneous tissue were sharply divided. Deltopectoral interval was encountered.  Cephalic vein mobilized medially.  Cobalt retractor was placed.  Biceps tendon was identified and tenodesed.  Rotator interval released all way up to the glenoid. Biceps tendon tenodesed to the upper border of the pec using #2 FiberWire suture.  At this time, the axillary nerve was palpated and actually tagged with a vessel loop.  It was protected at all times during the case.  Next, the subscap was peeled off the lesser tuberosity and tagged with two #2 FiberWire sutures.  Then at this time, anterior capsule was released off the anterior glenoid neck with care being taken to avoid injury to the axillary nerve.  The capsule was then peeled down to around the 8 o'clock position on the humeral head.  Humeral head cut was then made using intramedullary alignment and 30 degrees of retroversion and 135 degrees.  This cut was then covered with cap. Attention was then directed towards the glenoid.  Batwing retractor placed posteriorly, anterior glenoid neck retractor placed anteriorly, Hohmann placed above the bicipital tendon.  At this time, using a small bur and a small guide pin, the central vault, which was small on this patient was identified.  With the central vault identified, sequential reaming for the base plate was performed.  The patient did have excellent capture within the vault of the central PEG.  Base plate was then tapped into position after thorough irrigation and preparation. Two locking screws were placed in the base plate and then the central screw was then used in bicortical fashion to suck the base plate to the glenoid prepared  bleeding bone surface, which was placed about in 10 degrees of inferior inclination.  At this time, 2 locking screws were tightened after the compression was achieved.  The glenosphere was then placed after hand reaming peripherally.  Next, attention was directed towards the humerus.  The patient had a small humerus and a size 5 was initially approached, attempt was made for 6, but it was not seen.  With the 5 in place, cheese grater reaming was then performed and then the reduction was performed with +3, +6, and +9.  With the +9, there was audible pop with reduction and good stability with internal rotation, external rotation, forward flexion.  All in all, range of motion was significantly improved.  Trial components on the humeral side were removed.  Thorough irrigation performed again, then the true components were placed with bone grafting proximally.  Same stability was achieved with reduction with +9.  At this time, thorough irrigation was again performed.  Subscap repaired to the prosthesis using the two #2 FiberWire sutures.  Subscap itself was somewhat flimsy, but did have good excursion.  Next, deltopectoral interval was closed using 0-Vicryl suture followed by interrupted inverted 2-0 Vicryl suture and running 3- 0 Monocryl.  Aquacel dressing placed.  The patient was placed in a shoulder sling.  Axillary nerve was palpated again prior to closure and found to be intact with normal tug test.  The patient was placed in a sling and then transferred to the recovery room in stable condition.     Anderson Malta, M.D.     GSD/MEDQ  D:  06/04/2014  T:  06/05/2014  Job:  [id number]

## 2014-06-05 NOTE — Progress Notes (Signed)
06/05/14  1820  Patient is on the list with [facility].  CM called and left voice mail message for Laverda Sorenson at [phone] that no order noted and patient may be d/c am of 06/06/14.  Will continue to monitor.

## 2014-06-05 NOTE — Progress Notes (Signed)
Occupational Therapy Evaluation Patient Details Name: Christine [name] MRN: [id number] DOB: 31-Jan-1955 Today's Date: 06/05/2014    History of Present Illness 59 yo female S/P REVERSE RIGHT SHOULDER ARTHROPLASTY   Clinical Impression   Patient independent PTA. Patient currently functioning at up to max assist level secondary to immobilization of right shoulder, decreased ROM in left shoulder, and body habitus. Patient will benefit from acute OT to increase overall independence in the areas of ADLs, functional mobility, shoulder education, and overall safety in order to safely discharge home with 24/7 supervision/assistance from sister. Pt unable to perform PROM > right shoulder due to decreased ROM > left UE and body habitus. Pt states she will have a PROM machine at home to assist with this, recommending pt just perform elbow>distal AROM, no shoulder PROM at this time.     Follow Up Recommendations  No OT follow up;Supervision/Assistance - 24 hour (OPOT once cleared by MD and if appropriate )    Equipment Recommendations  None recommended by OT    Recommendations for Other Services Other (comment) (Vestibular evaluation)     Precautions / Restrictions Precautions Precautions: Shoulder;Fall Shoulder Interventions: Shoulder sling/immobilizer;Off for dressing/bathing/exercises Restrictions Weight Bearing Restrictions: Yes RUE Weight Bearing: Non weight bearing      Mobility Bed Mobility Overal bed mobility: Needs Assistance Bed Mobility: Rolling;Supine to Sit Rolling: Min guard   Supine to sit: Mod assist     General bed mobility comments: Due to left shoulder complications and immobilization of right shoulder, pt with difficulty performing in bed mobility. Educated pt's sister (main caregiver) on how to assist pt in/out of bed.  Transfers Overall transfer level: Needs assistance Equipment used: None Transfers: Sit to/from Stand Sit to Stand: Supervision General transfer  comment: Pt with complaints of dizziness during transitional movements, recommending vestibular evaluation     Balance   Sitting-balance support: Single extremity supported;Feet supported Sitting balance-Leahy Scale: Good     Standing balance support: No upper extremity supported;During functional activity Standing balance-Leahy Scale: Fair    ADL Overall ADL's : Needs assistance/impaired General ADL Comments: Pt requires assistance with ADLs due to shoulder immobilization and body habitus. Pt's sister (main caregiver) is supportive and states she will be with pt for 24/7 supervision/assist. Pt with complaints of dizziness during transitional movements, therefore recommending vestibular evaluation.     Pertinent Vitals/Pain Pain Assessment: Faces Faces Pain Scale: Hurts little more Pain Location: right shoulder during transitional movements and bed mobility Pain Descriptors / Indicators: Grimacing Pain Intervention(s): Limited activity within patient's tolerance;Monitored during session;Repositioned     Hand Dominance Right   Extremity/Trunk Assessment Upper Extremity Assessment Upper Extremity Assessment: RUE deficits/detail RUE Deficits / Details: reverse shoulder; NWB, no AROM, decreased PROM - see order RUE: Unable to fully assess due to immobilization   Lower Extremity Assessment Lower Extremity Assessment: Overall WFL for tasks assessed   Cervical / Trunk Assessment Cervical / Trunk Assessment: Normal   Communication Communication Communication: No difficulties   Cognition Arousal/Alertness: Awake/alert Behavior During Therapy: WFL for tasks assessed/performed Overall Cognitive Status: Within Functional Limits for tasks assessed         Shoulder Instructions Shoulder Instructions Donning/doffing shirt without moving shoulder: Moderate assistance;Caregiver independent with task Method for sponge bathing under operated UE: Minimal assistance;Caregiver independent  with task Donning/doffing sling/immobilizer: Maximal assistance;Caregiver independent with task Correct positioning of sling/immobilizer: Minimal assistance;Caregiver independent with task Pendulum exercises (written home exercise program):  (n/a) ROM for elbow, wrist and digits of operated UE: Caregiver independent with task;Supervision/safety  Sling wearing schedule (on at all times/off for ADL's): Supervision/safety;Caregiver independent with task Proper positioning of operated UE when showering: Minimal assistance;Caregiver independent with task Positioning of UE while sleeping: Minimal assistance;Caregiver independent with task    Home Living Family/patient expects to be discharged to:: Private residence (home with sister) Living Arrangements: Other relatives (sister) Available Help at Discharge: Family;Available 24 hours/day (sister) Type of Home: House Home Access: Stairs to enter [employer] of Steps: 3 Entrance Stairs-Rails: Right;Left;Can reach both Home Layout: One level     Bathroom Shower/Tub: Tub/shower unit;Curtain   Biochemist, clinical: Standard     Home Equipment: Walker - standard;Cane - single point;Bedside commode;Shower seat   Prior Functioning/Environment Level of Independence: Independent     OT Diagnosis: Generalized weakness;Acute pain   OT Problem List: Decreased strength;Decreased range of motion;Decreased activity tolerance;Impaired balance (sitting and/or standing);Decreased safety awareness;Decreased knowledge of precautions;Pain;Obesity   OT Treatment/Interventions: Self-care/ADL training;Therapeutic exercise;Energy conservation;DME and/or AE instruction;Therapeutic activities;Patient/family education;Balance training    OT Goals(Current goals can be found in the care plan section) Acute Rehab OT Goals Patient Stated Goal: go home tomorrow OT Goal Formulation: With patient/family Time For Goal Achievement: 06/12/14 Potential to Achieve  Goals: Good ADL Goals Pt Will Perform Upper Body Dressing: with min assist;with caregiver independent in assisting;sitting;standing Additional ADL Goal #1: Caregiver will be independent with assisting pt for UB/LB bathing & dressing while adhereing to shoulder precautions  Additional ADL Goal #2: Pt's caregiver will independently don/doff sling Additional ADL Goal #3: Pt will be independent with directing care for RUE positioning and elbow>finger exercises   OT Frequency: Min 2X/week   Barriers to D/C: None known at this time   End of Session Equipment Utilized During Treatment: Other (comment) (sling>RUE)  Activity Tolerance: Patient tolerated treatment well Patient left: in chair;with call bell/phone within reach;with family/visitor present   Time: 0812-0900 OT Time Calculation (min): 48 min Charges:  OT General Charges $OT Visit: 1 Procedure OT Evaluation $Initial OT Evaluation Tier I: 1 Procedure OT Treatments $Self Care/Home Management : 8-22 mins $Therapeutic Exercise: 8-22 mins  Remona Boom , MS, OTR/L, CLT Pager: [phone]  06/05/2014, 9:33 AM

## 2014-06-05 NOTE — Progress Notes (Signed)
Subjective: Pt stable - pain controlled well   Objective: Vital signs in last 24 hours: Temp:  [97.1 F (36.2 C)-99 F (37.2 C)] 97.7 F (36.5 C) (06/01 0607) Pulse Rate:  [83-103] 97 (06/01 0607) Resp:  [16-28] 16 (06/01 0607) BP: (88-112)/(37-62) 95/57 mmHg (06/01 0607) SpO2:  [93 %-99 %] 95 % (06/01 0607)  Intake/Output from previous day: 05/31 0701 - 06/01 0700 In: 2600 [P.O.:50; I.V.:2550] Out: 4765 [Urine:3025; Blood:350] Intake/Output this shift:    Exam:  No cellulitis present Compartment soft  Labs: No results for input(s): HGB in the last 72 hours. No results for input(s): WBC, RBC, HCT, PLT in the last 72 hours. No results for input(s): NA, K, CL, CO2, BUN, CREATININE, GLUCOSE, CALCIUM in the last 72 hours. No results for input(s): LABPT, INR in the last 72 hours.  Assessment/Plan: Plan OT today - dc am   Lulla Linville SCOTT 06/05/2014, 7:05 AM

## 2014-06-06 MED ORDER — HYDROCODONE-ACETAMINOPHEN 10-325 MG PO TABS: 1.0000 | ORAL_TABLET | Freq: Four times a day (QID) | ORAL | Status: DC | PRN

## 2014-06-06 MED ORDER — CYCLOBENZAPRINE HCL 10 MG PO TABS: 10.0000 mg | ORAL_TABLET | Freq: Three times a day (TID) | ORAL | Status: DC | PRN

## 2014-06-06 MED ORDER — XARELTO 10 MG PO TABS: 10.0000 mg | ORAL_TABLET | Freq: Every day | ORAL | Status: DC

## 2014-06-06 NOTE — Progress Notes (Signed)
Therapist asked pt and sister if they had any questions regarding Shoulder Protocol that we went over yesterday. Both said they had no questions. Pt reports she feels that she is doing better today, no dizziness. Pt and sister report that pt has been getting in/out of bed by herself. Upon entering room while pt supine in bed, sling was positioned correctly with pillow behind UE to prevent shoulder extension in supine. No further questions or concerns from pt or family. Pt supposed to discharge today.  Estelle June, , MS, OTR/L, CLT         Pager: [phone]

## 2014-06-06 NOTE — Progress Notes (Signed)
Subjective: Pt stable - pain ok   Objective: Vital signs in last 24 hours: Temp:  [97.6 F (36.4 C)-98.3 F (36.8 C)] 98.3 F (36.8 C) (06/02 0550) Pulse Rate:  [92-97] 97 (06/02 0550) Resp:  [16-18] 16 (06/02 0550) BP: (88-100)/(45-57) 88/45 mmHg (06/02 0550) SpO2:  [94 %-97 %] 97 % (06/02 0550)  Intake/Output from previous day: 06/01 0701 - 06/02 0700 In: 490 [P.O.:490] Out: 300 [Urine:300] Intake/Output this shift:    Exam:  No cellulitis present  Labs: No results for input(s): HGB in the last 72 hours. No results for input(s): WBC, RBC, HCT, PLT in the last 72 hours. No results for input(s): NA, K, CL, CO2, BUN, CREATININE, GLUCOSE, CALCIUM in the last 72 hours. No results for input(s): LABPT, INR in the last 72 hours.  Assessment/Plan: Plan dc today   DEAN,GREGORY SCOTT 06/06/2014, 8:16 AM

## 2014-06-07 ENCOUNTER — Encounter (HOSPITAL_COMMUNITY): Payer: Self-pay | Admitting: Orthopedic Surgery

## 2014-06-11 NOTE — Discharge Summary (Signed)
Physician Discharge Summary  Patient ID: Christine [name] MRN: [id number] DOB/AGE: 08-21-1955 59 y.o.  Admit date: 06/04/2014 Discharge date: 06/06/2014  Admission Diagnoses:  Active Problems:   Arthritis of shoulder region, right, degenerative   Discharge Diagnoses:  Same  Surgeries: Procedure(s): REVERSE SHOULDER ARTHROPLASTY on 06/04/2014   Consultants:    Discharged Condition: Stable  Hospital Course: Christine [name] is an 59 y.o. female who was admitted 06/04/2014 with a chief complaint of right shoulder pain, and found to have a diagnosis of rotator cuff arthropathy and arthritis.  They were brought to the operating room on 06/04/2014 and underwent the above named procedures.   She tolerated procedure well. She was mobilized with occupational and physical therapy on postop day #1 (1 exercises. Motor sensory function to the hand was intact once the block wore off she is discharged home postop day #2 in good condition CPM machine at home to facilitate range of motion exercises follow-up with me in 7 days for evaluation keep incision dry Antibiotics given:  Anti-infectives    Start     Dose/Rate Route Frequency Ordered Stop   06/04/14 2000  vancomycin (VANCOCIN) IVPB 1000 mg/200 mL premix     1,000 mg 200 mL/hr over 60 Minutes Intravenous  Once 06/04/14 1439 06/04/14 2143   06/04/14 1920  vancomycin (VANCOCIN) IVPB 1000 mg/200 mL premix     1,000 mg 200 mL/hr over 60 Minutes Intravenous Every 12 hours 06/04/14 1451 06/04/14 1903   06/04/14 1600  clindamycin (CLEOCIN) IVPB 600 mg     600 mg 100 mL/hr over 30 Minutes Intravenous Every 8 hours 06/04/14 1439 06/05/14 0646   06/04/14 0745  clindamycin (CLEOCIN) 900 mg in dextrose 5 % 50 mL IVPB     900 mg 112 mL/hr over 30 Minutes Intravenous To Surgery 06/04/14 0733 06/04/14 0720   06/04/14 0745  vancomycin (VANCOCIN) IVPB 1000 mg/200 mL premix     1,000 mg 200 mL/hr over 60 Minutes Intravenous To Surgery 06/04/14 0734 06/04/14  0820    .  Recent vital signs:  Filed Vitals:   06/06/14 0550  BP: 88/45  Pulse: 97  Temp: 98.3 F (36.8 C)  Resp: 16    Recent laboratory studies:  Results for orders placed or performed during the hospital encounter of 05/29/14  Surgical pcr screen  Result Value Ref Range   MRSA, PCR NEGATIVE NEGATIVE   Staphylococcus aureus NEGATIVE NEGATIVE  Type and screen  Result Value Ref Range   ABO/RH(D) A POS    Antibody Screen NEG    Sample Expiration 06/12/2014     Discharge Medications:     Medication List    STOP taking these medications        acetaminophen 325 MG tablet  Commonly known as:  TYLENOL     meloxicam 15 MG tablet  Commonly known as:  MOBIC      TAKE these medications        cholecalciferol 1000 UNITS tablet  Commonly known as:  VITAMIN D  Take 1,000 Units by mouth daily.     cyclobenzaprine 10 MG tablet  Commonly known as:  FLEXERIL  Take 1 tablet (10 mg total) by mouth 3 (three) times daily as needed for muscle spasms.     diphenhydramine-acetaminophen 25-500 MG Tabs  Commonly known as:  TYLENOL PM  Take 2 tablets by mouth at bedtime as needed.     HYDROcodone-acetaminophen 10-325 MG per tablet  Commonly known as:  NORCO  Take 1-2 tablets by  mouth every 6 (six) hours as needed for moderate pain.     lansoprazole 30 MG capsule  Commonly known as:  PREVACID  Take 1 capsule (30 mg total) by mouth daily.     magnesium gluconate 500 MG tablet  Commonly known as:  MAGONATE  Take 500 mg by mouth 2 (two) times daily.     MUCINEX PO  Take 400 mg by mouth daily as needed (allergies).     oxybutynin 10 MG 24 hr tablet  Commonly known as:  DITROPAN XL  Take 1 tablet (10 mg total) by mouth at bedtime.     traMADol 50 MG tablet  Commonly known as:  ULTRAM  Take 50 mg by mouth every 6 (six) hours as needed for moderate pain.     valsartan-hydrochlorothiazide 320-12.5 MG per tablet  Commonly known as:  DIOVAN-HCT  Take 1 tablet by mouth  daily.     XARELTO 10 MG Tabs tablet  Generic drug:  rivaroxaban  Take 1 tablet (10 mg total) by mouth daily.        Diagnostic Studies: Dg Shoulder Right Port  Jun 07, 2014   CLINICAL DATA:  Status post right shoulder surgery.  EXAM: PORTABLE RIGHT SHOULDER - 2+ VIEW  COMPARISON:  CT right shoulder 11/12/2013.  FINDINGS: An AP portable view of the right shoulder demonstrates immediate postoperative changes of right shoulder arthroplasty. No periprosthetic fracture or hardware complication is identified. There are degenerative changes of the acromioclavicular joint. Acromioclavicular joint is aligned. Expected locules of soft tissue gas are seen about the joint.  IMPRESSION: Immediate postoperative changes of right shoulder arthroplasty. No complicating features.   Electronically Signed   By: Curlene Dolphin M.D.   On: 2014/06/07 14:36    Disposition: 01-Home or Self Care      Discharge Instructions    Call MD / Call 911    Complete by:  As directed   If you experience chest pain or shortness of breath, CALL 911 and be transported to the hospital emergency room.  If you develope a fever above 101 F, pus (white drainage) or increased drainage or redness at the wound, or calf pain, call your surgeon's office.     Constipation Prevention    Complete by:  As directed   Drink plenty of fluids.  Prune juice may be helpful.  You may use a stool softener, such as Colace (over the counter) 100 mg twice a day.  Use MiraLax (over the counter) for constipation as needed.     Diet - low sodium heart healthy    Complete by:  As directed      Discharge instructions    Complete by:  As directed   CPM 4 -  6 hours per day right shoulder OK to dc sling for activities of daily living Remove dressing on shoulder on 06/11/2014 OK to shower at that time     Increase activity slowly as tolerated    Complete by:  As directed               Signed: Ezri Fanguy SCOTT 06/11/2014, 5:06 PM

## 2014-06-12 NOTE — Addendum Note (Signed)
Addended by: Starlyn Skeans A on: 06/12/2014 02:02 PM   Modules accepted: Miquel Dunn

## 2014-06-19 ENCOUNTER — Telehealth: Payer: Self-pay | Admitting: *Deleted

## 2014-06-19 NOTE — Telephone Encounter (Signed)
[facility] nurse called and reported  Patient had right shoulder surgery and her BP has been 100/60 and 112/76. Patient has not been taking her BP medd.per orders Dr Melford Aase, [place] to stay off her med and monitor her BP.  Nirse aware.

## 2014-06-20 ENCOUNTER — Other Ambulatory Visit: Payer: Self-pay

## 2014-06-20 ENCOUNTER — Encounter: Payer: Self-pay | Admitting: Physician Assistant

## 2014-06-20 MED ORDER — OXYBUTYNIN CHLORIDE ER 10 MG PO TB24: 10.0000 mg | ORAL_TABLET | Freq: Every day | ORAL | Status: DC

## 2014-06-25 ENCOUNTER — Encounter: Payer: Self-pay | Admitting: Physician Assistant

## 2014-06-25 ENCOUNTER — Encounter: Payer: Self-pay | Admitting: Internal Medicine

## 2014-06-26 ENCOUNTER — Telehealth: Payer: Self-pay

## 2014-06-26 NOTE — Telephone Encounter (Signed)
Joann at [facility] called stating that patient's blood pressure is elevated 132/80 and that patient has gained 12 pounds since being discharged from the hospital 06/04/14. Patient was told to stop Diovan-HCT 320-12.5mg  last week because of low blood pressure. Per Dr. Melford Aase, patient needs to restart medication and monitor blood pressure and weight daily. If patient continues to gain weight then he will prescribe another diuretic. Christine [name] is aware and will call back next week with results.

## 2014-07-11 ENCOUNTER — Ambulatory Visit (AMBULATORY_SURGERY_CENTER): Payer: Self-pay | Admitting: *Deleted

## 2014-07-11 VITALS — Ht 62.0 in | Wt 247.4 lb

## 2014-07-11 DIAGNOSIS — Z800 Family history of malignant neoplasm of digestive organs: Secondary | ICD-10-CM

## 2014-07-11 MED ORDER — NA SULFATE-K SULFATE-MG SULF 17.5-3.13-1.6 GM/177ML PO SOLN: 1.0000 | Freq: Once | ORAL | Status: DC

## 2014-07-11 NOTE — Progress Notes (Signed)
Denies allergies to eggs or soy products. Denies complications with sedation or anesthesia. Denies O2 use. Denies use of diet or weight loss medications.  Emmi instructions given for colonoscopy.  

## 2014-07-25 ENCOUNTER — Encounter: Payer: Self-pay | Admitting: Internal Medicine

## 2014-07-25 ENCOUNTER — Ambulatory Visit (AMBULATORY_SURGERY_CENTER): Payer: 59 | Admitting: Internal Medicine

## 2014-07-25 VITALS — BP 114/62 | HR 77 | Temp 97.0°F | Resp 21 | Ht 62.0 in | Wt 247.0 lb

## 2014-07-25 DIAGNOSIS — Z1211 Encounter for screening for malignant neoplasm of colon: Secondary | ICD-10-CM | POA: Diagnosis present

## 2014-07-25 DIAGNOSIS — K635 Polyp of colon: Secondary | ICD-10-CM

## 2014-07-25 DIAGNOSIS — D122 Benign neoplasm of ascending colon: Secondary | ICD-10-CM | POA: Diagnosis not present

## 2014-07-25 DIAGNOSIS — Z800 Family history of malignant neoplasm of digestive organs: Secondary | ICD-10-CM

## 2014-07-25 LAB — HM COLONOSCOPY

## 2014-07-25 MED ORDER — SODIUM CHLORIDE 0.9 % IV SOLN: 500.0000 mL | INTRAVENOUS | Status: DC

## 2014-07-25 NOTE — Progress Notes (Signed)
A/ox3 pleased with MAC, report to Kristen RN 

## 2014-07-25 NOTE — Patient Instructions (Signed)
YOU HAD AN ENDOSCOPIC PROCEDURE TODAY AT [facility]:   Refer to the procedure report that was given to you for any specific questions about what was found during the examination.  If the procedure report does not answer your questions, please call your gastroenterologist to clarify.  If you requested that your care partner not be given the details of your procedure findings, then the procedure report has been included in a sealed envelope for you to review at your convenience later.  YOU SHOULD EXPECT: Some feelings of bloating in the abdomen. Passage of more gas than usual.  Walking can help get rid of the air that was put into your GI tract during the procedure and reduce the bloating. If you had a lower endoscopy (such as a colonoscopy or flexible sigmoidoscopy) you may notice spotting of blood in your stool or on the toilet paper. If you underwent a bowel prep for your procedure, you may not have a normal bowel movement for a few days.  Please Note:  You might notice some irritation and congestion in your nose or some drainage.  This is from the oxygen used during your procedure.  There is no need for concern and it should clear up in a day or so.  SYMPTOMS TO REPORT IMMEDIATELY:   Following lower endoscopy (colonoscopy or flexible sigmoidoscopy):  Excessive amounts of blood in the stool  Significant tenderness or worsening of abdominal pains  Swelling of the abdomen that is new, acute  Fever of 100F or higher  For urgent or emergent issues, a gastroenterologist can be reached at any hour by calling [phone].   DIET: Your first meal following the procedure should be a small meal and then it is ok to progress to your normal diet. Heavy or fried foods are harder to digest and may make you feel nauseous or bloated.  Likewise, meals heavy in dairy and vegetables can increase bloating.  Drink plenty of fluids but you should avoid alcoholic beverages for 24  hours.  ACTIVITY:  You should plan to take it easy for the rest of today and you should NOT DRIVE or use heavy machinery until tomorrow (because of the sedation medicines used during the test).    FOLLOW UP: Our staff will call the number listed on your records the next business day following your procedure to check on you and address any questions or concerns that you may have regarding the information given to you following your procedure. If we do not reach you, we will leave a message.  However, if you are feeling well and you are not experiencing any problems, there is no need to return our call.  We will assume that you have returned to your regular daily activities without incident.  If any biopsies were taken you will be contacted by phone or by letter within the next 1-3 weeks.  Please call us at [phone] if you have not heard about the biopsies in 3 weeks.    SIGNATURES/CONFIDENTIALITY: You and/or your care partner have signed paperwork which will be entered into your electronic medical record.  These signatures attest to the fact that that the information above on your After Visit Summary has been reviewed and is understood.  Full responsibility of the confidentiality of this discharge information lies with you and/or your care-partner.  Continue your normal medications  Please read over handouts about polyps, diverticulosis and high fiber diets

## 2014-07-25 NOTE — Progress Notes (Signed)
Called to room to assist during endoscopic procedure.  Patient ID and intended procedure confirmed with present staff. Received instructions for my participation in the procedure from the performing physician.  

## 2014-07-25 NOTE — Op Note (Signed)
[place]  [address] [place], [id number]   COLONOSCOPY PROCEDURE REPORT  PATIENT: Christine, [name]  MR#: [id number] BIRTHDATE: [birth year] , 56  yrs. old GENDER: female ENDOSCOPIST: Jerene Bears, MD PROCEDURE DATE:  07/25/2014 PROCEDURE:   Colonoscopy, screening and Colonoscopy with cold biopsy polypectomy First Screening Colonoscopy - Avg.  risk and is 50 yrs.  old or older - No.  Prior Negative Screening - Now for repeat screening. Less than 10 yrs Prior Negative Screening - Now for repeat screening.  Above average risk  History of Adenoma - Now for follow-up colonoscopy & has been > or = to 3 yrs.  N/A  Polyps removed today? Yes ASA CLASS:   Class II INDICATIONS:Screening for colonic neoplasia and FH Colon cancer (mother), last colonoscopy 2011 (Dr. Sharlett Iles). MEDICATIONS: Monitored anesthesia care and Propofol 250 mg IV  DESCRIPTION OF PROCEDURE:   After the risks benefits and alternatives of the procedure were thoroughly explained, informed consent was obtained.  The digital rectal exam revealed several skin tags.   The LB [id number]  endoscope was introduced through the anus and advanced to the terminal ileum which was intubated for a short distance. No adverse events experienced. The quality of the prep was good.  (Suprep was used)  The instrument was then slowly withdrawn as the colon was fully examined. Estimated blood loss is zero unless otherwise noted in this procedure report.   COLON FINDINGS: A sessile polyp measuring 3 mm in size was found in the ascending colon.  A polypectomy was performed with cold forceps.  The resection was complete, the polyp tissue was completely retrieved and sent to histology.   There was moderate diverticulosis noted in the ascending colon and left colon with associated muscular hypertrophy.  Retroflexed views revealed internal hemorrhoids and Retroflexed views revealed external hemorrhoids. The time to  cecum = 1.4 Withdrawal time = 13.0   The scope was withdrawn and the procedure completed. COMPLICATIONS: There were no immediate complications.  ENDOSCOPIC IMPRESSION: 1.   Sessile polyp was found in the ascending colon; polypectomy was performed with cold forceps 2.   There was moderate diverticulosis noted in the ascending colon and left colon  RECOMMENDATIONS: 1.  Await pathology results 2.  High fiber diet 3.  Repeat Colonoscopy in 5 years. 4.  You will receive a letter within 1-2 weeks with the results of your biopsy as well as final recommendations.  Please call my office if you have not received a letter after 3 weeks.  eSigned:  Jerene Bears, MD 07/25/2014 3:43 PM   cc: [name], MD and The Patient

## 2014-07-26 ENCOUNTER — Telehealth: Payer: Self-pay | Admitting: *Deleted

## 2014-07-26 NOTE — Telephone Encounter (Signed)
No answer, left message to call if questions or concerns. 

## 2014-07-31 ENCOUNTER — Encounter: Payer: Self-pay | Admitting: Internal Medicine

## 2014-09-06 ENCOUNTER — Other Ambulatory Visit: Payer: Self-pay

## 2014-09-18 ENCOUNTER — Other Ambulatory Visit: Payer: Self-pay

## 2014-09-18 MED ORDER — LANSOPRAZOLE 30 MG PO CPDR: 30.0000 mg | DELAYED_RELEASE_CAPSULE | Freq: Every day | ORAL | Status: DC

## 2014-09-18 MED ORDER — OXYBUTYNIN CHLORIDE ER 10 MG PO TB24: 10.0000 mg | ORAL_TABLET | Freq: Every day | ORAL | Status: DC

## 2014-09-18 MED ORDER — CYCLOBENZAPRINE HCL 10 MG PO TABS: 10.0000 mg | ORAL_TABLET | Freq: Three times a day (TID) | ORAL | Status: DC | PRN

## 2014-09-18 MED ORDER — MELOXICAM 15 MG PO TABS: 15.0000 mg | ORAL_TABLET | Freq: Every day | ORAL | Status: DC

## 2014-09-18 MED ORDER — VALSARTAN-HYDROCHLOROTHIAZIDE 320-12.5 MG PO TABS: 1.0000 | ORAL_TABLET | Freq: Every day | ORAL | Status: DC

## 2014-09-25 ENCOUNTER — Ambulatory Visit (INDEPENDENT_AMBULATORY_CARE_PROVIDER_SITE_OTHER): Payer: 59 | Admitting: Physician Assistant

## 2014-09-25 ENCOUNTER — Encounter: Payer: Self-pay | Admitting: Physician Assistant

## 2014-09-25 VITALS — BP 108/62 | HR 92 | Temp 98.2°F | Resp 18 | Ht 62.5 in | Wt 247.4 lb

## 2014-09-25 DIAGNOSIS — E559 Vitamin D deficiency, unspecified: Secondary | ICD-10-CM | POA: Diagnosis not present

## 2014-09-25 DIAGNOSIS — H8110 Benign paroxysmal vertigo, unspecified ear: Secondary | ICD-10-CM | POA: Diagnosis not present

## 2014-09-25 DIAGNOSIS — R7309 Other abnormal glucose: Secondary | ICD-10-CM | POA: Diagnosis not present

## 2014-09-25 DIAGNOSIS — E785 Hyperlipidemia, unspecified: Secondary | ICD-10-CM

## 2014-09-25 DIAGNOSIS — E669 Obesity, unspecified: Secondary | ICD-10-CM

## 2014-09-25 DIAGNOSIS — I10 Essential (primary) hypertension: Secondary | ICD-10-CM | POA: Diagnosis not present

## 2014-09-25 DIAGNOSIS — J0100 Acute maxillary sinusitis, unspecified: Secondary | ICD-10-CM

## 2014-09-25 DIAGNOSIS — Z79899 Other long term (current) drug therapy: Secondary | ICD-10-CM

## 2014-09-25 MED ORDER — PROMETHAZINE-CODEINE 6.25-10 MG/5ML PO SYRP: 5.0000 mL | ORAL_SOLUTION | Freq: Four times a day (QID) | ORAL | Status: DC | PRN

## 2014-09-25 MED ORDER — VALSARTAN-HYDROCHLOROTHIAZIDE 320-12.5 MG PO TABS: 1.0000 | ORAL_TABLET | Freq: Every day | ORAL | Status: DC

## 2014-09-25 MED ORDER — PREDNISONE 20 MG PO TABS: ORAL_TABLET | ORAL | Status: DC

## 2014-09-25 MED ORDER — FLUTICASONE PROPIONATE 50 MCG/ACT NA SUSP: 2.0000 | Freq: Every day | NASAL | Status: DC

## 2014-09-25 MED ORDER — CYCLOBENZAPRINE HCL 10 MG PO TABS: 10.0000 mg | ORAL_TABLET | Freq: Three times a day (TID) | ORAL | Status: DC | PRN

## 2014-09-25 MED ORDER — MELOXICAM 15 MG PO TABS: 15.0000 mg | ORAL_TABLET | Freq: Every day | ORAL | Status: DC

## 2014-09-25 MED ORDER — LANSOPRAZOLE 30 MG PO CPDR: 30.0000 mg | DELAYED_RELEASE_CAPSULE | Freq: Every day | ORAL | Status: DC

## 2014-09-25 MED ORDER — OXYBUTYNIN CHLORIDE ER 10 MG PO TB24: 10.0000 mg | ORAL_TABLET | Freq: Every day | ORAL | Status: DC

## 2014-09-25 MED ORDER — LEVOFLOXACIN 500 MG PO TABS: 500.0000 mg | ORAL_TABLET | Freq: Every day | ORAL | Status: DC

## 2014-09-25 NOTE — Progress Notes (Signed)
Assessment and Plan:  1. Hypertension -Continue medication, monitor blood pressure at home. Continue DASH diet.  Reminder to go to the ER if any CP, SOB, nausea, dizziness, severe HA, changes vision/speech, left arm numbness and tingling and jaw pain.  2. Cholesterol -Continue diet and exercise. Check cholesterol.   3. Prediabetes  -Continue diet and exercise. Check A1C  4. Vitamin D Def - check level and continue medications.   5. Vertigo with effusion Levaquin, prednisone, continue claritin D, flonase, check labs BP is soft, cut diovan in half for now and push fluids, monitor BP.  Go to the ER if any CP, SOB, nausea, dizziness, severe HA, changes vision/speech  Continue diet and meds as discussed. Further disposition pending results of labs. Over 30 minutes of exam, counseling, chart review, and critical decision making was performed  HPI 59 y.o. female  presents for 3 month follow up on hypertension, cholesterol, prediabetes, and vitamin D deficiency.   Her blood pressure has been controlled at home, today their BP is BP: 108/62 mmHg  She does workout. She denies chest pain, shortness of breath, dizziness.  She is on cholesterol medication and denies myalgias. Her cholesterol is at goal. The cholesterol last visit was:   Lab Results  Component Value Date   CHOL 193 05/21/2014   HDL 43* 05/21/2014   LDLCALC 113* 05/21/2014   TRIG 187* 05/21/2014   CHOLHDL 4.5 05/21/2014    She has been working on diet and exercise for prediabetes, and denies paresthesia of the feet, polydipsia, polyuria and visual disturbances. Last A1C in the office was:  Lab Results  Component Value Date   HGBA1C 5.4 05/21/2014   Patient is on Vitamin D supplement.   Lab Results  Component Value Date   VD25OH 29* 05/21/2014     BMI is Body mass index is 44.5 kg/(m^2)., she is working on diet and exercise. Wt Readings from Last 3 Encounters:  09/25/14 247 lb 6.4 oz (112.22 kg)  07/25/14 247 lb  (112.038 kg)  07/11/14 247 lb 6.4 oz (112.22 kg)   She is s/p right shoulder replacement May 31st, she has history of vertigo. She was at PT last Thursday when she laid on her left side and had vertigo, she has had vomiting x 2-3 times and diarrhea without blood/mucus, now has chills, cough without mucus. No CP, SOB, palpitations.   Current Medications:  Current Outpatient Prescriptions on File Prior to Visit  Medication Sig Dispense Refill  . cholecalciferol (VITAMIN D) 1000 UNITS tablet Take 1,000 Units by mouth daily.    . cyclobenzaprine (FLEXERIL) 10 MG tablet Take 1 tablet (10 mg total) by mouth 3 (three) times daily as needed for muscle spasms. 270 tablet 3  . diphenhydramine-acetaminophen (TYLENOL PM) 25-500 MG TABS Take 2 tablets by mouth at bedtime as needed.    . GuaiFENesin (MUCINEX PO) Take 400 mg by mouth daily as needed (allergies).    Marland Kitchen HYDROcodone-acetaminophen (NORCO) 10-325 MG per tablet Take 1-2 tablets by mouth every 6 (six) hours as needed for moderate pain. (Patient not taking: Reported on 07/25/2014) 60 tablet 0  . lansoprazole (PREVACID) 30 MG capsule Take 1 capsule (30 mg total) by mouth daily. 90 capsule 3  . magnesium gluconate (MAGONATE) 500 MG tablet Take 500 mg by mouth 2 (two) times daily.    . meloxicam (MOBIC) 15 MG tablet Take 1 tablet (15 mg total) by mouth daily. 90 tablet 3  . oxybutynin (DITROPAN XL) 10 MG 24 hr tablet  Take 1 tablet (10 mg total) by mouth at bedtime. 90 tablet 3  . traMADol (ULTRAM) 50 MG tablet Take 50 mg by mouth every 6 (six) hours as needed for moderate pain.     . valsartan-hydrochlorothiazide (DIOVAN-HCT) 320-12.5 MG per tablet Take 1 tablet by mouth daily. 90 tablet 3   No current facility-administered medications on file prior to visit.   Medical History:  Past Medical History  Diagnosis Date  . Environmental allergies   . Arthritis   . Asthma   . Hypertension   . Hyperlipidemia   . GERD (gastroesophageal reflux disease)    . Hiatal hernia     with stricture dilation  . Kidney stones 2008  . Obesity, unspecified   . PONV (postoperative nausea and vomiting)   . UTI (urinary tract infection)     RECENTLY TX WITH ANTIBIOTIC BY PCP  . Headache    Allergies:  Allergies  Allergen Reactions  . Keflex [Cephalexin] Anaphylaxis and Swelling  . Codeine     REACTION: nausea/vomiting     Review of Systems:  Review of Systems  Constitutional: Positive for chills and malaise/fatigue. Negative for fever, weight loss and diaphoresis.  HENT: Positive for congestion and ear pain (fullness). Negative for ear discharge, hearing loss, nosebleeds, sore throat and tinnitus.   Eyes: Negative.   Respiratory: Positive for cough. Negative for hemoptysis, sputum production, shortness of breath, wheezing and stridor.   Cardiovascular: Negative.  Negative for chest pain, palpitations and leg swelling.  Gastrointestinal: Positive for nausea, vomiting and diarrhea. Negative for heartburn, abdominal pain, constipation, blood in stool and melena.  Genitourinary: Negative.   Musculoskeletal: Negative.   Skin: Negative.  Negative for rash.  Neurological: Positive for dizziness. Negative for tingling, tremors, sensory change, speech change, focal weakness, seizures, loss of consciousness, weakness and headaches.  Psychiatric/Behavioral: Negative.     Family history- Review and unchanged Social history- Review and unchanged Physical Exam: BP 108/62 mmHg  Pulse 92  Temp(Src) 98.2 F (36.8 C)  Resp 18  Ht 5' 2.5" (1.588 m)  Wt 247 lb 6.4 oz (112.22 kg)  BMI 44.50 kg/m2  LMP 10/18/2013 Wt Readings from Last 3 Encounters:  09/25/14 247 lb 6.4 oz (112.22 kg)  07/25/14 247 lb (112.038 kg)  07/11/14 247 lb 6.4 oz (112.22 kg)   General Appearance: Well nourished, in no apparent distress. Eyes: PERRLA, EOMs, conjunctiva no swelling or erythema Sinuses: + Frontal/maxillary tenderness ENT/Mouth: Ext aud canals clear, TMs without  erythema, + bilateral effusion, No erythema, swelling, or exudate on post pharynx.  Tonsils not swollen or erythematous. Hearing normal.  Neck: Supple, thyroid normal.  Respiratory: Respiratory effort normal, BS equal bilaterally without rales, rhonchi, wheezing or stridor.  Cardio: RRR with no MRGs. Brisk peripheral pulses without edema.  Abdomen: Soft, + BS, obese, mild epigastric tenderness, no guarding, rebound, hernias, masses. Lymphatics: Non tender without lymphadenopathy.  Musculoskeletal: Full ROM, 5/5 strength, Normal gait Skin: Warm, dry without rashes, lesions, ecchymosis.  Neuro: Cranial nerves intact. Normal muscle tone, no cerebellar symptoms. Psych: Awake and oriented X 3, normal affect, Insight and Judgment appropriate.    Vicie Mutters, PA-C 11:07 AM [facility]

## 2014-09-25 NOTE — Patient Instructions (Signed)
Cut your diovan in half and monitor your blood pressure, if BP goes above 140/90 then get back on a whole pill.   Your ears and sinuses are connected by the eustachian tube. When your sinuses are inflamed, this can close off the tube and cause fluid to collect in your middle ear. This can then cause dizziness, popping, clicking, ringing, and echoing in your ears. This is often NOT an infection and does NOT require antibiotics, it is caused by inflammation so the treatments help the inflammation. This can take a long time to get better so please be patient.  Here are things you can do to help with this: - Try the Flonase or Nasonex. Remember to spray each nostril twice towards the outer part of your eye.  Do not sniff but instead pinch your nose and tilt your head back to help the medicine get into your sinuses.  The best time to do this is at bedtime.Stop if you get blurred vision or nose bleeds.  -While drinking fluids, pinch and hold nose close and swallow, to help open eustachian tubes to drain fluid behind ear drums. -Please pick one of the over the counter allergy medications below and take it once daily for allergies.  It will also help with fluid behind ear drums. Claritin or loratadine cheapest but likely the weakest  Zyrtec or certizine at night because it can make you sleepy The strongest is allegra or fexafinadine  Cheapest at [employer], [employer], [employer] -can use decongestant over the counter, please do not use if you have high blood pressure or certain heart conditions.   if worsening HA, changes vision/speech, imbalance, weakness go to the ER

## 2014-09-26 LAB — MAGNESIUM: MAGNESIUM: 1.8 mg/dL (ref 1.5–2.5)

## 2014-09-26 LAB — BASIC METABOLIC PANEL WITH GFR
BUN: 35 mg/dL — ABNORMAL HIGH (ref 7–25)
CALCIUM: 8.8 mg/dL (ref 8.6–10.4)
CO2: 22 mmol/L (ref 20–31)
CREATININE: 2.32 mg/dL — AB (ref 0.50–1.05)
Chloride: 96 mmol/L — ABNORMAL LOW (ref 98–110)
GFR, EST AFRICAN AMERICAN: 26 mL/min — AB (ref >=60)
GFR, EST NON AFRICAN AMERICAN: 23 mL/min — AB (ref >=60)
GLUCOSE: 89 mg/dL (ref 65–99)
Potassium: 3.7 mmol/L (ref 3.5–5.3)
Sodium: 134 mmol/L — ABNORMAL LOW (ref 135–146)

## 2014-09-26 LAB — CBC WITH DIFFERENTIAL/PLATELET
BASOS PCT: 0 % (ref 0–1)
Basophils Absolute: 0 10*3/uL (ref 0.0–0.1)
EOS PCT: 0 % (ref 0–5)
Eosinophils Absolute: 0 10*3/uL (ref 0.0–0.7)
HCT: 31.4 % — ABNORMAL LOW (ref 36.0–46.0)
Hemoglobin: 10.6 g/dL — ABNORMAL LOW (ref 12.0–15.0)
LYMPHS PCT: 7 % — AB (ref 12–46)
Lymphs Abs: 0.9 10*3/uL (ref 0.7–4.0)
MCH: 27.2 pg (ref 26.0–34.0)
MCHC: 33.8 g/dL (ref 30.0–36.0)
MCV: 80.7 fL (ref 78.0–100.0)
MONO ABS: 1 10*3/uL (ref 0.1–1.0)
MPV: 9.6 fL (ref 8.6–12.4)
Monocytes Relative: 8 % (ref 3–12)
Neutro Abs: 10.7 10*3/uL — ABNORMAL HIGH (ref 1.7–7.7)
Neutrophils Relative %: 85 % — ABNORMAL HIGH (ref 43–77)
Platelets: 207 10*3/uL (ref 150–400)
RBC: 3.89 MIL/uL (ref 3.87–5.11)
RDW: 17.4 % — AB (ref 11.5–15.5)
WBC: 12.6 10*3/uL — AB (ref 4.0–10.5)

## 2014-09-26 LAB — LIPID PANEL
Cholesterol: 155 mg/dL (ref 125–200)
HDL: 28 mg/dL — ABNORMAL LOW (ref >=46)
LDL CALC: 94 mg/dL (ref <130)
Total CHOL/HDL Ratio: 5.5 Ratio — ABNORMAL HIGH (ref <=5.0)
Triglycerides: 163 mg/dL — ABNORMAL HIGH (ref <150)
VLDL: 33 mg/dL — ABNORMAL HIGH (ref <30)

## 2014-09-26 LAB — TSH: TSH: 2.152 u[IU]/mL (ref 0.350–4.500)

## 2014-09-26 LAB — HEPATIC FUNCTION PANEL
ALBUMIN: 3.2 g/dL — AB (ref 3.6–5.1)
ALT: 24 U/L (ref 6–29)
AST: 25 U/L (ref 10–35)
Alkaline Phosphatase: 102 U/L (ref 33–130)
BILIRUBIN INDIRECT: 0.5 mg/dL (ref 0.2–1.2)
Bilirubin, Direct: 0.2 mg/dL (ref <=0.2)
TOTAL PROTEIN: 6.6 g/dL (ref 6.1–8.1)
Total Bilirubin: 0.7 mg/dL (ref 0.2–1.2)

## 2014-09-26 LAB — VITAMIN D 25 HYDROXY (VIT D DEFICIENCY, FRACTURES): VIT D 25 HYDROXY: 33 ng/mL (ref 30–100)

## 2014-09-26 LAB — HEMOGLOBIN A1C
HEMOGLOBIN A1C: 5.9 % — AB (ref <5.7)
Mean Plasma Glucose: 123 mg/dL — ABNORMAL HIGH (ref <117)

## 2014-09-26 LAB — INSULIN, FASTING: INSULIN FASTING, SERUM: 17.2 u[IU]/mL (ref 2.0–19.6)

## 2014-09-27 ENCOUNTER — Ambulatory Visit (INDEPENDENT_AMBULATORY_CARE_PROVIDER_SITE_OTHER): Payer: 59 | Admitting: Physician Assistant

## 2014-09-27 ENCOUNTER — Encounter: Payer: Self-pay | Admitting: Physician Assistant

## 2014-09-27 VITALS — BP 126/80 | HR 113 | Temp 97.0°F | Resp 16 | Wt 247.0 lb

## 2014-09-27 DIAGNOSIS — R82998 Other abnormal findings in urine: Secondary | ICD-10-CM

## 2014-09-27 DIAGNOSIS — R8299 Other abnormal findings in urine: Secondary | ICD-10-CM | POA: Diagnosis not present

## 2014-09-27 DIAGNOSIS — R109 Unspecified abdominal pain: Secondary | ICD-10-CM

## 2014-09-27 LAB — BASIC METABOLIC PANEL WITH GFR
BUN: 33 mg/dL — ABNORMAL HIGH (ref 7–25)
CALCIUM: 9.2 mg/dL (ref 8.6–10.4)
CO2: 23 mmol/L (ref 20–31)
Chloride: 99 mmol/L (ref 98–110)
Creat: 1.6 mg/dL — ABNORMAL HIGH (ref 0.50–1.05)
GFR, EST NON AFRICAN AMERICAN: 36 mL/min — AB (ref >=60)
GFR, Est African American: 41 mL/min — ABNORMAL LOW (ref >=60)
GLUCOSE: 83 mg/dL (ref 65–99)
POTASSIUM: 3.6 mmol/L (ref 3.5–5.3)
SODIUM: 135 mmol/L (ref 135–146)

## 2014-09-27 LAB — URINALYSIS, MICROSCOPIC ONLY
Bacteria, UA: NONE SEEN [HPF]
CASTS: NONE SEEN [LPF]
Crystals: NONE SEEN [HPF]
RBC / HPF: NONE SEEN RBC/HPF (ref <=2)
WBC, UA: >60 WBC/HPF — AB (ref <=5)
YEAST: NONE SEEN [HPF]

## 2014-09-27 LAB — CBC WITH DIFFERENTIAL/PLATELET
BASOS ABS: 0 10*3/uL (ref 0.0–0.1)
BASOS PCT: 0 % (ref 0–1)
Eosinophils Absolute: 0 10*3/uL (ref 0.0–0.7)
Eosinophils Relative: 0 % (ref 0–5)
HEMATOCRIT: 31.3 % — AB (ref 36.0–46.0)
HEMOGLOBIN: 10.4 g/dL — AB (ref 12.0–15.0)
LYMPHS PCT: 7 % — AB (ref 12–46)
Lymphs Abs: 1.1 10*3/uL (ref 0.7–4.0)
MCH: 26.3 pg (ref 26.0–34.0)
MCHC: 33.2 g/dL (ref 30.0–36.0)
MCV: 79.2 fL (ref 78.0–100.0)
MPV: 9.9 fL (ref 8.6–12.4)
Monocytes Absolute: 1.7 10*3/uL — ABNORMAL HIGH (ref 0.1–1.0)
Monocytes Relative: 11 % (ref 3–12)
NEUTROS ABS: 12.5 10*3/uL — AB (ref 1.7–7.7)
Neutrophils Relative %: 82 % — ABNORMAL HIGH (ref 43–77)
Platelets: 265 10*3/uL (ref 150–400)
RBC: 3.95 MIL/uL (ref 3.87–5.11)
RDW: 17.7 % — ABNORMAL HIGH (ref 11.5–15.5)
WBC: 15.3 10*3/uL — AB (ref 4.0–10.5)

## 2014-09-27 LAB — URINALYSIS, ROUTINE W REFLEX MICROSCOPIC
Bilirubin Urine: NEGATIVE
GLUCOSE, UA: NEGATIVE
Ketones, ur: NEGATIVE
NITRITE: NEGATIVE
PH: 6.5 (ref 5.0–8.0)
SPECIFIC GRAVITY, URINE: 1.005 (ref 1.001–1.035)

## 2014-09-27 LAB — HEPATIC FUNCTION PANEL
ALK PHOS: 85 U/L (ref 33–130)
ALT: 27 U/L (ref 6–29)
AST: 28 U/L (ref 10–35)
Albumin: 3.2 g/dL — ABNORMAL LOW (ref 3.6–5.1)
BILIRUBIN DIRECT: 0.1 mg/dL (ref <=0.2)
BILIRUBIN INDIRECT: 0.2 mg/dL (ref 0.2–1.2)
TOTAL PROTEIN: 6.5 g/dL (ref 6.1–8.1)
Total Bilirubin: 0.3 mg/dL (ref 0.2–1.2)

## 2014-09-27 NOTE — Patient Instructions (Signed)
Flank Pain °Flank pain refers to pain that is located on the side of the body between the upper abdomen and the back. The pain may occur over a short period of time (acute) or may be long-term or reoccurring (chronic). It may be mild or severe. Flank pain can be caused by many things. °CAUSES  °Some of the more common causes of flank pain include: °· Muscle strains.   °· Muscle spasms.   °· A disease of your spine (vertebral disk disease).   °· A lung infection (pneumonia).   °· Fluid around your lungs (pulmonary edema).   °· A kidney infection.   °· Kidney stones.   °· A very painful skin rash caused by the chickenpox virus (shingles).   °· Gallbladder disease.   °HOME CARE INSTRUCTIONS  °Home care will depend on the cause of your pain. In general, °· Rest as directed by your caregiver. °· Drink enough fluids to keep your urine clear or pale yellow. °· Only take over-the-counter or prescription medicines as directed by your caregiver. Some medicines may help relieve the pain. °· Tell your caregiver about any changes in your pain. °· Follow up with your caregiver as directed. °SEEK IMMEDIATE MEDICAL CARE IF:  °· Your pain is not controlled with medicine.   °· You have new or worsening symptoms. °· Your pain increases.   °· You have abdominal pain.   °· You have shortness of breath.   °· You have persistent nausea or vomiting.   °· You have swelling in your abdomen.   °· You feel faint or pass out.   °· You have blood in your urine. °· You have a fever or persistent symptoms for more than 2-3 days. °· You have a fever and your symptoms suddenly get worse. °MAKE SURE YOU:  °· Understand these instructions. °· Will watch your condition. °· Will get help right away if you are not doing well or get worse. °Document Released: 02/11/2005 Document Revised: 09/15/2011 Document Reviewed: 08/05/2011 °[employer]® Patient Information ©2015 [employer]. This information is not intended to replace advice given to you by your  health care provider. Make sure you discuss any questions you have with your health care provider. ° °

## 2014-09-27 NOTE — Progress Notes (Signed)
Assessment and Plan: AKI, possible UTI/kidney stone- get stat CBC, BMP, urine with C&S- if worsening pain go to the ER, has hydrocodone/tramadol at home, stop diovan and mobic for now [phone], cell can leave a message.   HPI 59 y.o.female with history of UTI/kidney stones presents for follow up from visit on 09/21 due to abnormal labs/AKI. Seen 09/21 for vertigo, vomiting, diarrhea, chills, cough. Given levaquin that she has finished.  She was found to be severely dehydrated, anemic and had a left shift. She was told to increase water and presents today to be evaluated and recheck labs before the weekend. She has been increasing water and states she is feeling better, less weakness, no coughing, SOB, CP, has started to have right lower AB pain and back pain, with dark/tan urine this morning.    Lab Results  Component Value Date   CREATININE 2.32* 09/25/2014   BUN 35* 09/25/2014   NA 134* 09/25/2014   K 3.7 09/25/2014   CL 96* 09/25/2014   CO2 22 09/25/2014   Lab Results  Component Value Date   WBC 12.6* 09/25/2014   HGB 10.6* 09/25/2014   HCT 31.4* 09/25/2014   MCV 80.7 09/25/2014   PLT 207 09/25/2014   Lab Results  Component Value Date   ALT 24 09/25/2014   AST 25 09/25/2014   ALKPHOS 102 09/25/2014   BILITOT 0.7 09/25/2014     Past Medical History  Diagnosis Date  . Environmental allergies   . Arthritis   . Asthma   . Hypertension   . Hyperlipidemia   . GERD (gastroesophageal reflux disease)   . Hiatal hernia     with stricture dilation  . Kidney stones 2008  . Obesity, unspecified   . PONV (postoperative nausea and vomiting)   . UTI (urinary tract infection)     RECENTLY TX WITH ANTIBIOTIC BY PCP  . Headache      Allergies  Allergen Reactions  . Keflex [Cephalexin] Anaphylaxis and Swelling  . Codeine     REACTION: nausea/vomiting      Current Outpatient Prescriptions on File Prior to Visit  Medication Sig Dispense Refill  . cholecalciferol (VITAMIN D)  1000 UNITS tablet Take 1,000 Units by mouth daily.    . cyclobenzaprine (FLEXERIL) 10 MG tablet Take 1 tablet (10 mg total) by mouth 3 (three) times daily as needed for muscle spasms. 270 tablet 3  . diphenhydramine-acetaminophen (TYLENOL PM) 25-500 MG TABS Take 2 tablets by mouth at bedtime as needed.    . fluticasone (FLONASE) 50 MCG/ACT nasal spray Place 2 sprays into both nostrils at bedtime. 16 g 1  . GuaiFENesin (MUCINEX PO) Take 400 mg by mouth daily as needed (allergies).    Marland Kitchen HYDROcodone-acetaminophen (NORCO) 10-325 MG per tablet Take 1-2 tablets by mouth every 6 (six) hours as needed for moderate pain. (Patient not taking: Reported on 07/25/2014) 60 tablet 0  . lansoprazole (PREVACID) 30 MG capsule Take 1 capsule (30 mg total) by mouth daily. 90 capsule 3  . levofloxacin (LEVAQUIN) 500 MG tablet Take 1 tablet (500 mg total) by mouth daily. 10 tablet 0  . magnesium gluconate (MAGONATE) 500 MG tablet Take 500 mg by mouth 2 (two) times daily.    . meloxicam (MOBIC) 15 MG tablet Take 1 tablet (15 mg total) by mouth daily. 90 tablet 3  . oxybutynin (DITROPAN XL) 10 MG 24 hr tablet Take 1 tablet (10 mg total) by mouth at bedtime. 90 tablet 3  . predniSONE (DELTASONE) 20  MG tablet 2 tablets daily for 3 days, 1 tablet daily for 4 days. 10 tablet 0  . promethazine-codeine (PHENERGAN WITH CODEINE) 6.25-10 MG/5ML syrup Take 5 mLs by mouth every 6 (six) hours as needed for cough. Max: 62mL per day 240 mL 0  . traMADol (ULTRAM) 50 MG tablet Take 50 mg by mouth every 6 (six) hours as needed for moderate pain.     . valsartan-hydrochlorothiazide (DIOVAN-HCT) 320-12.5 MG per tablet Take 1 tablet by mouth daily. 90 tablet 3   No current facility-administered medications on file prior to visit.    ROS: all negative except above.   Physical Exam: There were no vitals filed for this visit. LMP 10/18/2013 General Appearance: Well nourished, in no apparent distress. Eyes: PERRLA, EOMs, conjunctiva no  swelling or erythema Sinuses: No Frontal/maxillary tenderness ENT/Mouth: Ext aud canals clear, TMs without erythema, bulging. No erythema, swelling, or exudate on post pharynx.  Tonsils not swollen or erythematous. Hearing normal.  Neck: Supple, thyroid normal.  Respiratory: Respiratory effort normal, BS equal bilaterally without rales, rhonchi, wheezing or stridor.  Cardio: RRR with no MRGs. Brisk peripheral pulses without edema.  Abdomen: Soft, + BS.  Non tender, no guarding, rebound, hernias, masses. + right CVA tenderness Lymphatics: Non tender without lymphadenopathy.  Musculoskeletal: Full ROM, 5/5 strength, normal gait.  Skin: Warm, dry without rashes, lesions, ecchymosis.  Neuro: Cranial nerves intact. Normal muscle tone, no cerebellar symptoms.   Psych: Awake and oriented X 3, normal affect, Insight and Judgment appropriate.     Vicie Mutters, PA-C 10:46 AM [facility]

## 2014-09-27 NOTE — Addendum Note (Signed)
Addended by: Vladimir Crofts on: 09/27/2014 06:42 PM   Modules accepted: Orders

## 2014-09-29 LAB — URINE CULTURE: Colony Count: 40000

## 2014-09-30 ENCOUNTER — Other Ambulatory Visit: Payer: 59

## 2014-09-30 ENCOUNTER — Encounter: Payer: Self-pay | Admitting: Physician Assistant

## 2014-09-30 DIAGNOSIS — R109 Unspecified abdominal pain: Secondary | ICD-10-CM

## 2014-09-30 LAB — CBC WITH DIFFERENTIAL/PLATELET
BASOS PCT: 0 % (ref 0–1)
Basophils Absolute: 0 10*3/uL (ref 0.0–0.1)
EOS PCT: 1 % (ref 0–5)
Eosinophils Absolute: 0.2 10*3/uL (ref 0.0–0.7)
HCT: 29.6 % — ABNORMAL LOW (ref 36.0–46.0)
HEMOGLOBIN: 9.8 g/dL — AB (ref 12.0–15.0)
Lymphocytes Relative: 17 % (ref 12–46)
Lymphs Abs: 2.8 10*3/uL (ref 0.7–4.0)
MCH: 26.8 pg (ref 26.0–34.0)
MCHC: 33.1 g/dL (ref 30.0–36.0)
MCV: 81.1 fL (ref 78.0–100.0)
MONOS PCT: 6 % (ref 3–12)
MPV: 9.2 fL (ref 8.6–12.4)
Monocytes Absolute: 1 10*3/uL (ref 0.1–1.0)
Neutro Abs: 12.5 10*3/uL — ABNORMAL HIGH (ref 1.7–7.7)
Neutrophils Relative %: 76 % (ref 43–77)
PLATELETS: 369 10*3/uL (ref 150–400)
RBC: 3.65 MIL/uL — ABNORMAL LOW (ref 3.87–5.11)
RDW: 17.7 % — ABNORMAL HIGH (ref 11.5–15.5)
WBC: 16.4 10*3/uL — AB (ref 4.0–10.5)

## 2014-09-30 LAB — BASIC METABOLIC PANEL WITH GFR
BUN: 17 mg/dL (ref 7–25)
CALCIUM: 8.5 mg/dL — AB (ref 8.6–10.4)
CO2: 26 mmol/L (ref 20–31)
CREATININE: 0.98 mg/dL (ref 0.50–1.05)
Chloride: 104 mmol/L (ref 98–110)
GFR, EST AFRICAN AMERICAN: 74 mL/min (ref >=60)
GFR, Est Non African American: 64 mL/min (ref >=60)
Glucose, Bld: 90 mg/dL (ref 65–99)
Potassium: 3.8 mmol/L (ref 3.5–5.3)
Sodium: 139 mmol/L (ref 135–146)

## 2014-09-30 MED ORDER — CIPROFLOXACIN HCL 500 MG PO TABS: 500.0000 mg | ORAL_TABLET | Freq: Two times a day (BID) | ORAL | Status: AC

## 2014-09-30 MED ORDER — METRONIDAZOLE 500 MG PO TABS: 500.0000 mg | ORAL_TABLET | Freq: Three times a day (TID) | ORAL | Status: AC

## 2014-10-04 ENCOUNTER — Other Ambulatory Visit: Payer: 59

## 2014-10-08 ENCOUNTER — Ambulatory Visit
Admission: RE | Admit: 2014-10-08 | Discharge: 2014-10-08 | Disposition: A | Discharge status: Home / Self Care | Payer: 59 | Source: Ambulatory Visit | Attending: Physician Assistant | Admitting: Physician Assistant

## 2014-10-08 DIAGNOSIS — R109 Unspecified abdominal pain: Secondary | ICD-10-CM

## 2014-10-08 MED ORDER — IOPAMIDOL (ISOVUE-300) INJECTION 61%
125.0000 mL | Freq: Once | INTRAVENOUS | Status: AC | PRN
  Administered 2014-10-08: 125 mL via INTRAVENOUS

## 2014-10-09 ENCOUNTER — Telehealth: Payer: Self-pay | Admitting: Physician Assistant

## 2014-10-09 ENCOUNTER — Other Ambulatory Visit: Payer: Self-pay | Admitting: Physician Assistant

## 2014-10-09 ENCOUNTER — Encounter: Payer: Self-pay | Admitting: Physician Assistant

## 2014-10-09 DIAGNOSIS — N151 Renal and perinephric abscess: Secondary | ICD-10-CM

## 2014-10-09 DIAGNOSIS — N12 Tubulo-interstitial nephritis, not specified as acute or chronic: Secondary | ICD-10-CM

## 2014-10-09 DIAGNOSIS — D72829 Elevated white blood cell count, unspecified: Secondary | ICD-10-CM

## 2014-10-09 MED ORDER — LANSOPRAZOLE 30 MG PO CPDR: 30.0000 mg | DELAYED_RELEASE_CAPSULE | Freq: Two times a day (BID) | ORAL | Status: DC

## 2014-10-09 NOTE — Telephone Encounter (Signed)
Faxed urgent referral to [facility]. Waiting for appointment

## 2014-10-09 NOTE — Telephone Encounter (Signed)
Called patient to discuss CT results that show possible right renal abscess, patient has completed levaquin and states she is feeling better, kidney function better, still with leukocytosis, urine culture with no growth. No more right flank pain, no fever, chills, eating/drinking okay.  No signs of sepsis.   Will refer to urology to evaluate for need for further antibiotics or draining.  If she has any pain, nausea, fever, chills she will go to the ER.   Lab Results  Component Value Date   CREATININE 0.98 09/30/2014   BUN 17 09/30/2014   NA 139 09/30/2014   K 3.8 09/30/2014   CL 104 09/30/2014   CO2 26 09/30/2014   Lab Results  Component Value Date   WBC 16.4* 09/30/2014   HGB 9.8* 09/30/2014   HCT 29.6* 09/30/2014   MCV 81.1 09/30/2014   PLT 369 09/30/2014

## 2014-10-10 ENCOUNTER — Other Ambulatory Visit: Payer: Self-pay | Admitting: Physician Assistant

## 2014-10-10 ENCOUNTER — Encounter: Payer: Self-pay | Admitting: Physician Assistant

## 2014-10-10 MED ORDER — FLUCONAZOLE 150 MG PO TABS: 150.0000 mg | ORAL_TABLET | Freq: Every day | ORAL | Status: DC

## 2014-10-11 ENCOUNTER — Encounter: Payer: Self-pay | Admitting: Physician Assistant

## 2014-10-12 MED ORDER — LANSOPRAZOLE 30 MG PO CPDR: 30.0000 mg | DELAYED_RELEASE_CAPSULE | Freq: Two times a day (BID) | ORAL | Status: DC

## 2014-10-14 ENCOUNTER — Encounter: Payer: Self-pay | Admitting: Physician Assistant

## 2014-10-15 ENCOUNTER — Other Ambulatory Visit: Payer: Self-pay | Admitting: Physician Assistant

## 2014-10-15 ENCOUNTER — Encounter: Payer: Self-pay | Admitting: Physician Assistant

## 2014-10-15 MED ORDER — FLUCONAZOLE 150 MG PO TABS: 150.0000 mg | ORAL_TABLET | Freq: Every day | ORAL | Status: DC

## 2014-10-15 MED ORDER — TERCONAZOLE 0.4 % VA CREA: 1.0000 | TOPICAL_CREAM | Freq: Every day | VAGINAL | Status: DC

## 2014-10-16 ENCOUNTER — Encounter: Payer: Self-pay | Admitting: Physician Assistant

## 2014-10-16 ENCOUNTER — Other Ambulatory Visit: Payer: Self-pay | Admitting: Physician Assistant

## 2014-10-16 MED ORDER — ESOMEPRAZOLE MAGNESIUM 40 MG PO CPDR: 40.0000 mg | DELAYED_RELEASE_CAPSULE | Freq: Every day | ORAL | Status: DC

## 2014-10-21 ENCOUNTER — Encounter: Payer: Self-pay | Admitting: Physician Assistant

## 2014-10-22 MED ORDER — LACTOBACILLUS CASEI-FOLIC ACID 60-1.25 MG PO CAPS: ORAL_CAPSULE | ORAL | Status: DC

## 2014-10-22 MED ORDER — PANTOPRAZOLE SODIUM 40 MG PO TBEC: 40.0000 mg | DELAYED_RELEASE_TABLET | Freq: Every day | ORAL | Status: DC

## 2014-10-23 ENCOUNTER — Encounter: Payer: Self-pay | Admitting: Physician Assistant

## 2014-11-06 ENCOUNTER — Encounter: Payer: Self-pay | Admitting: Physician Assistant

## 2014-12-19 ENCOUNTER — Encounter: Payer: Self-pay | Admitting: Physician Assistant

## 2014-12-19 ENCOUNTER — Other Ambulatory Visit: Payer: Self-pay

## 2014-12-19 ENCOUNTER — Ambulatory Visit (INDEPENDENT_AMBULATORY_CARE_PROVIDER_SITE_OTHER): Payer: 59 | Admitting: Physician Assistant

## 2014-12-19 DIAGNOSIS — D649 Anemia, unspecified: Secondary | ICD-10-CM | POA: Diagnosis not present

## 2014-12-19 DIAGNOSIS — Z79899 Other long term (current) drug therapy: Secondary | ICD-10-CM | POA: Diagnosis not present

## 2014-12-19 DIAGNOSIS — N179 Acute kidney failure, unspecified: Secondary | ICD-10-CM

## 2014-12-19 LAB — CBC WITH DIFFERENTIAL/PLATELET
BASOS ABS: 0.1 10*3/uL (ref 0.0–0.1)
Basophils Relative: 1 % (ref 0–1)
Eosinophils Absolute: 0.4 10*3/uL (ref 0.0–0.7)
Eosinophils Relative: 6 % — ABNORMAL HIGH (ref 0–5)
HCT: 37.8 % (ref 36.0–46.0)
HEMOGLOBIN: 12 g/dL (ref 12.0–15.0)
LYMPHS ABS: 1.9 10*3/uL (ref 0.7–4.0)
LYMPHS PCT: 30 % (ref 12–46)
MCH: 28.4 pg (ref 26.0–34.0)
MCHC: 31.7 g/dL (ref 30.0–36.0)
MCV: 89.6 fL (ref 78.0–100.0)
MPV: 9.4 fL (ref 8.6–12.4)
Monocytes Absolute: 0.5 10*3/uL (ref 0.1–1.0)
Monocytes Relative: 8 % (ref 3–12)
NEUTROS ABS: 3.4 10*3/uL (ref 1.7–7.7)
NEUTROS PCT: 55 % (ref 43–77)
PLATELETS: 291 10*3/uL (ref 150–400)
RBC: 4.22 MIL/uL (ref 3.87–5.11)
RDW: 14.5 % (ref 11.5–15.5)
WBC: 6.2 10*3/uL (ref 4.0–10.5)

## 2014-12-19 LAB — HEPATIC FUNCTION PANEL
ALBUMIN: 3.9 g/dL (ref 3.6–5.1)
ALK PHOS: 72 U/L (ref 33–130)
ALT: 20 U/L (ref 6–29)
AST: 22 U/L (ref 10–35)
Bilirubin, Direct: 0.1 mg/dL (ref <=0.2)
Indirect Bilirubin: 0.3 mg/dL (ref 0.2–1.2)
TOTAL PROTEIN: 7.3 g/dL (ref 6.1–8.1)
Total Bilirubin: 0.4 mg/dL (ref 0.2–1.2)

## 2014-12-19 LAB — IRON AND TIBC
%SAT: 20 % (ref 11–50)
Iron: 85 ug/dL (ref 45–160)
TIBC: 428 ug/dL (ref 250–450)
UIBC: 343 ug/dL (ref 125–400)

## 2014-12-19 LAB — BASIC METABOLIC PANEL WITH GFR
BUN: 22 mg/dL (ref 7–25)
CHLORIDE: 100 mmol/L (ref 98–110)
CO2: 24 mmol/L (ref 20–31)
Calcium: 9.5 mg/dL (ref 8.6–10.4)
Creat: 0.9 mg/dL (ref 0.50–1.05)
GFR, Est African American: 82 mL/min (ref >=60)
GFR, Est Non African American: 71 mL/min (ref >=60)
GLUCOSE: 83 mg/dL (ref 65–99)
POTASSIUM: 4.2 mmol/L (ref 3.5–5.3)
Sodium: 136 mmol/L (ref 135–146)

## 2014-12-19 LAB — VITAMIN B12: VITAMIN B 12: 357 pg/mL (ref 211–911)

## 2014-12-19 LAB — MAGNESIUM: Magnesium: 1.9 mg/dL (ref 1.5–2.5)

## 2014-12-19 LAB — FERRITIN: FERRITIN: 28 ng/mL (ref 10–291)

## 2014-12-19 NOTE — Progress Notes (Signed)
Assessment and Plan: Anemia- check labs including iron, ferritin AKI- back on diovan/mobic- recheck BMP UTI- recheck urine Morbid Obesity with co morbidities- long discussion about weight loss, diet, and exercise Ear effusion, no infection -Allergy pill, flonase, autoinflation, explained no need for ABX at this time.   Future Appointments Date Time Provider [place]  05/22/2015 9:00 AM Vicie Mutters, PA-C [id number] None    HPI 59 y.o.female presents for follow up for possible renal abscess. She had UTI symptoms with fever, chills on 09/21, started on ABX, had CT on 10/08/2014 showing possible renal abscess, continued on cipro and sent to urology for evaluation, she saw Dr. Gorden Harms and was kept on ABX therapy for an additional month. She had a follow up CT last month that showed resolution of the abscess. She presents today for recheck of labs. She is back on the divoan and mobic daily.  She denies any AB pain, SOB, CP, nausea, dizziness, fever, chills. She has had some ear fullness some rhinorrhea, has been taking allegra D.   BMI is Body mass index is 45.87 kg/(m^2)., she is working on diet and exercise. Wt Readings from Last 3 Encounters:  12/19/14 255 lb (115.667 kg)  09/27/14 247 lb (112.038 kg)  09/25/14 247 lb 6.4 oz (112.22 kg)    Lab Results  Component Value Date   WBC 16.4* 09/30/2014   HGB 9.8* 09/30/2014   HCT 29.6* 09/30/2014   MCV 81.1 09/30/2014   PLT 369 09/30/2014   Lab Results  Component Value Date   CREATININE 0.98 09/30/2014   BUN 17 09/30/2014   NA 139 09/30/2014   K 3.8 09/30/2014   CL 104 09/30/2014   CO2 26 09/30/2014    Blood pressure 130/80, pulse 82, temperature 97.3 F (36.3 C), temperature source Temporal, resp. rate 16, height 5' 2.5" (1.588 m), weight 255 lb (115.667 kg), last menstrual period 10/18/2013.   Past Medical History  Diagnosis Date  . Environmental allergies   . Arthritis   . Asthma   . Hypertension   .  Hyperlipidemia   . GERD (gastroesophageal reflux disease)   . Hiatal hernia     with stricture dilation  . Kidney stones 2008  . Obesity, unspecified   . PONV (postoperative nausea and vomiting)   . UTI (urinary tract infection)     RECENTLY TX WITH ANTIBIOTIC BY PCP  . Headache      Allergies  Allergen Reactions  . Keflex [Cephalexin] Anaphylaxis and Swelling  . Codeine     REACTION: nausea/vomiting      Current Outpatient Prescriptions on File Prior to Visit  Medication Sig Dispense Refill  . cholecalciferol (VITAMIN D) 1000 UNITS tablet Take 1,000 Units by mouth daily.    . cyclobenzaprine (FLEXERIL) 10 MG tablet Take 1 tablet (10 mg total) by mouth 3 (three) times daily as needed for muscle spasms. 270 tablet 3  . diphenhydramine-acetaminophen (TYLENOL PM) 25-500 MG TABS Take 2 tablets by mouth at bedtime as needed.    Marland Kitchen esomeprazole (NEXIUM) 40 MG capsule Take 1 capsule (40 mg total) by mouth daily. 90 capsule 1  . fluconazole (DIFLUCAN) 150 MG tablet Take 1 tablet (150 mg total) by mouth daily. 3 tablet 1  . HYDROcodone-acetaminophen (NORCO) 10-325 MG per tablet Take 1-2 tablets by mouth every 6 (six) hours as needed for moderate pain. 60 tablet 0  . Lactobacillus Casei-Folic Acid [id number] MG CAPS 1 pill daily 90 capsule 0  . lansoprazole (PREVACID) 30 MG capsule Take  1 capsule (30 mg total) by mouth 2 (two) times daily before a meal. 180 capsule 1  . magnesium gluconate (MAGONATE) 500 MG tablet Take 500 mg by mouth 2 (two) times daily.    . meloxicam (MOBIC) 15 MG tablet Take 1 tablet (15 mg total) by mouth daily. 90 tablet 3  . oxybutynin (DITROPAN XL) 10 MG 24 hr tablet Take 1 tablet (10 mg total) by mouth at bedtime. 90 tablet 3  . pantoprazole (PROTONIX) 40 MG tablet Take 1 tablet (40 mg total) by mouth daily. 90 tablet 1  . valsartan-hydrochlorothiazide (DIOVAN-HCT) 320-12.5 MG per tablet Take 1 tablet by mouth daily. 90 tablet 3   No current facility-administered  medications on file prior to visit.    ROS: all negative except above.   Physical Exam: Filed Weights   12/19/14 0857  Weight: 255 lb (115.667 kg)   BP 130/80 mmHg  Pulse 82  Temp(Src) 97.3 F (36.3 C) (Temporal)  Resp 16  Ht 5' 2.5" (1.588 m)  Wt 255 lb (115.667 kg)  BMI 45.87 kg/m2  LMP 10/18/2013 General Appearance: Well nourished, in no apparent distress. Eyes: PERRLA, EOMs, conjunctiva no swelling or erythema Sinuses: No Frontal/maxillary tenderness ENT/Mouth: Ext aud canals clear, TMs without erythema, bulging but + bilateral effusion. No erythema, swelling, or exudate on post pharynx.  Tonsils not swollen or erythematous. Hearing normal.  Neck: Supple, thyroid normal.  Respiratory: Respiratory effort normal, BS equal bilaterally without rales, rhonchi, wheezing or stridor.  Cardio: RRR with no MRGs. Brisk peripheral pulses without edema.  Abdomen: Soft, + BS, obese,  Non tender, no guarding, rebound, hernias, masses. Lymphatics: Non tender without lymphadenopathy.  Musculoskeletal: Full ROM, 5/5 strength,  Antalgic gait.  Skin: Warm, dry without rashes, lesions, ecchymosis.  Neuro: Cranial nerves intact. Normal muscle tone, no cerebellar symptoms.  Psych: Awake and oriented X 3, normal affect, Insight and Judgment appropriate.     Vicie Mutters, PA-C 9:10 AM [facility]

## 2014-12-19 NOTE — Patient Instructions (Signed)
Your ears and sinuses are connected by the eustachian tube. When your sinuses are inflamed, this can close off the tube and cause fluid to collect in your middle ear. This can then cause dizziness, popping, clicking, ringing, and echoing in your ears. This is often NOT an infection and does NOT require antibiotics, it is caused by inflammation so the treatments help the inflammation. This can take a long time to get better so please be patient.  Here are things you can do to help with this: - Try the Flonase or Nasonex. Remember to spray each nostril twice towards the outer part of your eye.  Do not sniff but instead pinch your nose and tilt your head back to help the medicine get into your sinuses.  The best time to do this is at bedtime.Stop if you get blurred vision or nose bleeds.  -While drinking fluids, pinch and hold nose close and swallow, to help open eustachian tubes to drain fluid behind ear drums. -Please pick one of the over the counter allergy medications below and take it once daily for allergies.  It will also help with fluid behind ear drums. Claritin or loratadine cheapest but likely the weakest  Zyrtec or certizine at night because it can make you sleepy The strongest is allegra or fexafinadine  Cheapest at [employer], [employer], [employer] -can use decongestant over the counter, please do not use if you have high blood pressure or certain heart conditions.   if worsening HA, changes vision/speech, imbalance, weakness go to the ER   We want weight loss that will last so you should lose 1-2 pounds a week.  THAT IS IT! Please pick THREE things a month to change. Once it is a habit check off the item. Then pick another three items off the list to become habits.  If you are already doing a habit on the list GREAT!  Cross that item off! o Don't drink your calories. Ie, alcohol, soda, fruit juice, and sweet tea.  o Drink more water. Drink a glass when you feel hungry or before each meal.  o Eat  breakfast - Complex carb and protein (likeDannon light and fit yogurt, oatmeal, fruit, eggs, [place] bacon). o Measure your cereal.  Eat no more than one cup a day. (ie [place]) o Eat an apple a day. o Add a vegetable a day. o Try a new vegetable a month. o Use Pam! Stop using oil or butter to cook. o Don't finish your plate or use smaller plates. o Share your dessert. o Eat sugar free Jello for dessert or frozen grapes. o Don't eat 2-3 hours before bed. o Switch to whole wheat bread, pasta, and brown rice. o Make healthier choices when you eat out. No fries! o Pick baked chicken, NOT fried. o Don't forget to SLOW DOWN when you eat. It is not going anywhere.  o Take the stairs. o Park far away in the parking lot o [employer] (or weights) for 10 minutes while watching TV. o Walk at work for 10 minutes during break. o Walk outside 1 time a week with your friend, kids, dog, or significant other. o Start a walking group at [place] the mall as much as you can tolerate.  o Keep a food diary. o Weigh yourself daily. o Walk for 15 minutes 3 days per week. o Cook at home more often and eat out less.  If life happens and you go back to old habits, it is  okay.  Just start over. You can do it!   If you experience chest pain, get short of breath, or tired during the exercise, please stop immediately and inform your doctor.   Before you even begin to attack a weight-loss plan, it pays to remember this: You are not fat. You have fat. Losing weight isn't about blame or shame; it's simply another achievement to accomplish. Dieting is like any other skill-you have to buckle down and work at it. As long as you act in a smart, reasonable way, you'll ultimately get where you want to be. Here are some weight loss pearls for you.  1. It's Not a Diet. It's a Lifestyle Thinking of a diet as something you're on and suffering through only for the short term doesn't work. To shed weight and keep it off,  you need to make permanent changes to the way you eat. It's OK to indulge occasionally, of course, but if you cut calories temporarily and then revert to your old way of eating, you'll gain back the weight quicker than you can say yo-yo. Use it to lose it. Research shows that one of the best predictors of long-term weight loss is how many pounds you drop in the first month. For that reason, nutritionists often suggest being stricter for the first two weeks of your new eating strategy to build momentum. Cut out added sugar and alcohol and avoid unrefined carbs. After that, figure out how you can reincorporate them in a way that's healthy and maintainable.  2. There's a Right Way to Exercise Working out burns calories and fat and boosts your metabolism by building muscle. But those trying to lose weight are notorious for overestimating the number of calories they burn and underestimating the amount they take in. Unfortunately, your system is biologically programmed to hold on to extra pounds and that means when you start exercising, your body senses the deficit and ramps up its hunger signals. If you're not diligent, you'll eat everything you burn and then some. Use it to lose it. Cardio gets all the exercise glory, but strength and interval training are the real heroes. They help you build lean muscle, which in turn increases your metabolism and calorie-burning ability 3. Don't Overreact to Mild Hunger Some people have a hard time losing weight because of hunger anxiety. To them, being hungry is bad-something to be avoided at all costs-so they carry snacks with them and eat when they don't need to. Others eat because they're stressed out or bored. While you never want to get to the point of being ravenous (that's when bingeing is likely to happen), a hunger pang, a craving, or the fact that it's 3:00 p.m. should not send you racing for the vending machine or obsessing about the energy bar in your purse.  Ideally, you should put off eating until your stomach is growling and it's difficult to concentrate.  Use it to lose it. When you feel the urge to eat, use the HALT method. Ask yourself, Am I really hungry? Or am I angry or anxious, lonely or bored, or tired? If you're still not certain, try the apple test. If you're truly hungry, an apple should seem delicious; if it doesn't, something else is going on. Or you can try drinking water and making yourself busy, if you are still hungry try a healthy snack.  4. Not All Calories Are Created Equal The mechanics of weight loss are pretty simple: Take in fewer calories than you use  for energy. But the kind of food you eat makes all the difference. Processed food that's high in saturated fat and refined starch or sugar can cause inflammation that disrupts the hormone signals that tell your brain you're full. The result: You eat a lot more.  Use it to lose it. Clean up your diet. Swap in whole, unprocessed foods, including vegetables, lean protein, and healthy fats that will fill you up and give you the biggest nutritional bang for your calorie buck. In a few weeks, as your brain starts receiving regular hunger and fullness signals once again, you'll notice that you feel less hungry overall and naturally start cutting back on the amount you eat.  5. Protein, Produce, and Plant-Based Fats Are Your Weight-Loss Trinity Here's why eating the three Ps regularly will help you drop pounds. Protein fills you up. You need it to build lean muscle, which keeps your metabolism humming so that you can torch more fat. People in a weight-loss program who ate double the recommended daily allowance for protein (about 110 grams for a 150-pound woman) lost 70 percent of their weight from fat, while people who ate the RDA lost only about 40 percent, one study found. Produce is packed with filling fiber. "It's very difficult to consume too many calories if you're eating a lot of  vegetables. Example: Three cups of broccoli is a lot of food, yet only 93 calories. (Fruit is another story. It can be easy to overeat and can contain a lot of calories from sugar, so be sure to monitor your intake.) Plant-based fats like olive oil and those in avocados and nuts are healthy and extra satiating.  Use it to lose it. Aim to incorporate each of the three Ps into every meal and snack. People who eat protein throughout the day are able to keep weight off, according to a study in the [place] of Clinical Nutrition. In addition to meat, poultry and seafood, good sources are beans, lentils, eggs, tofu, and yogurt. As for fat, keep portion sizes in check by measuring out salad dressing, oil, and nut butters (shoot for one to two tablespoons). Finally, eat veggies or a little fruit at every meal. People who did that consumed 308 fewer calories but didn't feel any hungrier than when they didn't eat more produce.  7. How You Eat Is As Important As What You Eat In order for your brain to register that you're full, you need to focus on what you're eating. Sit down whenever you eat, preferably at a table. Turn off the TV or computer, put down your phone, and look at your food. Smell it. Chew slowly, and don't put another bite on your fork until you swallow. When women ate lunch this attentively, they consumed 30 percent less when snacking later than those who listened to an audiobook at lunchtime, according to a study in the [place] of Nutrition. 8. Weighing Yourself Really Works The scale provides the best evidence about whether your efforts are paying off. Seeing the numbers tick up or down or stagnate is motivation to keep going-or to rethink your approach. A 2015 study at [facility] found that daily weigh-ins helped people lose more weight, keep it off, and maintain that loss, even after two years. Use it to lose it. Step on the scale at the same time every day for the best  results. If your weight shoots up several pounds from one weigh-in to the next, don't freak out. Eating a lot  of salt the night before or having your period is the likely culprit. The number should return to normal in a day or two. It's a steady climb that you need to do something about. 9. Too Much Stress and Too Little Sleep Are Your Enemies When you're tired and frazzled, your body cranks up the production of cortisol, the stress hormone that can cause carb cravings. Not getting enough sleep also boosts your levels of ghrelin, a hormone associated with hunger, while suppressing leptin, a hormone that signals fullness and satiety. People on a diet who slept only five and a half hours a night for two weeks lost 55 percent less fat and were hungrier than those who slept eight and a half hours, according to a study in the [place]. Use it to lose it. Prioritize sleep, aiming for seven hours or more a night, which research shows helps lower stress. And make sure you're getting quality zzz's. If a snoring spouse or a fidgety cat wakes you up frequently throughout the night, you may end up getting the equivalent of just four hours of sleep, according to a study from [facility]. Keep pets out of the bedroom, and use a white-noise app to drown out snoring. 10. You Will Hit a plateau-And You Can Bust Through It As you slim down, your body releases much less leptin, the fullness hormone.  If you're not strength training, start right now. Building muscle can raise your metabolism to help you overcome a plateau. To keep your body challenged and burning calories, incorporate new moves and more intense intervals into your workouts or add another sweat session to your weekly routine. Alternatively, cut an extra 100 calories or so a day from your diet. Now that you've lost weight, your body simply doesn't need as much fuel.

## 2014-12-20 ENCOUNTER — Encounter: Payer: Self-pay | Admitting: Physician Assistant

## 2014-12-20 LAB — URINALYSIS, ROUTINE W REFLEX MICROSCOPIC
Bilirubin Urine: NEGATIVE
Glucose, UA: NEGATIVE
HGB URINE DIPSTICK: NEGATIVE
KETONES UR: NEGATIVE
NITRITE: NEGATIVE
PH: 5.5 (ref 5.0–8.0)
PROTEIN: NEGATIVE
Specific Gravity, Urine: 1.011 (ref 1.001–1.035)

## 2014-12-20 LAB — URINALYSIS, MICROSCOPIC ONLY
Bacteria, UA: NONE SEEN [HPF]
CASTS: NONE SEEN [LPF]
CRYSTALS: NONE SEEN [HPF]
RBC / HPF: NONE SEEN RBC/HPF (ref <=2)
Yeast: NONE SEEN [HPF]

## 2014-12-20 LAB — URINE CULTURE: Colony Count: >=100000

## 2015-05-21 ENCOUNTER — Encounter: Payer: Self-pay | Admitting: Internal Medicine

## 2015-05-22 ENCOUNTER — Encounter: Payer: Self-pay | Admitting: Physician Assistant

## 2015-10-10 IMAGING — CT CT SHOULDER*R* W/O CM
3 series · 8 of 14 positions shown, 9 images · non-contrast
Comparison: None.

CLINICAL DATA: Bilateral shoulder pain with decreased range of
motion. Severe arthritis. No acute injury or prior relevant surgery.
Initial encounter.

EXAM:
CT OF THE RIGHT SHOULDER WITHOUT CONTRAST
TECHNIQUE: Multidetector CT imaging was performed according to the standard
protocol. Multiplanar CT image reconstructions were also generated.

[Series 5: shoulder soft · axial · 0.57mm/px · z∈[-164,-94]mm · 2 of 84 slices shown, 3 images]
[im 28/84  soft-tissue]
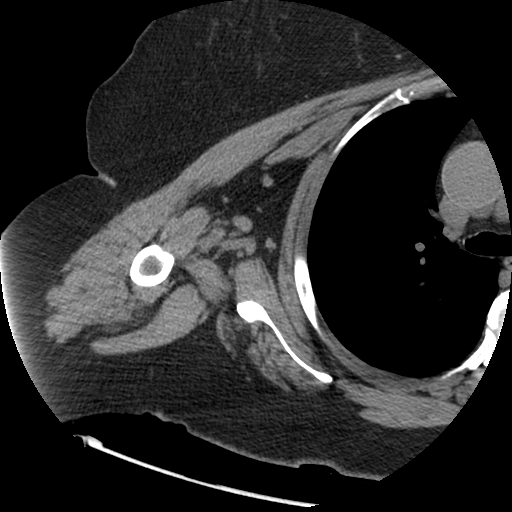
[im 28/84  bone]
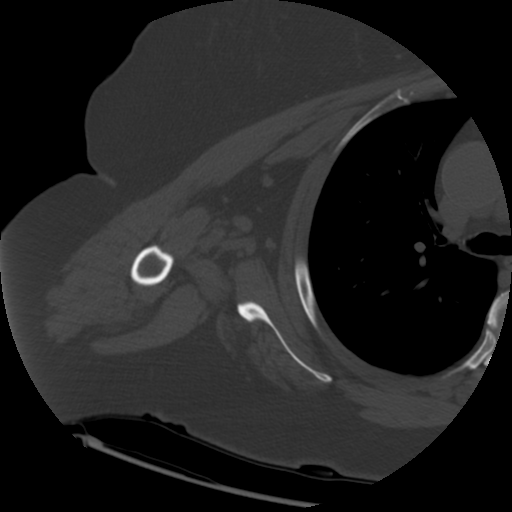
[im 56/84  bone]
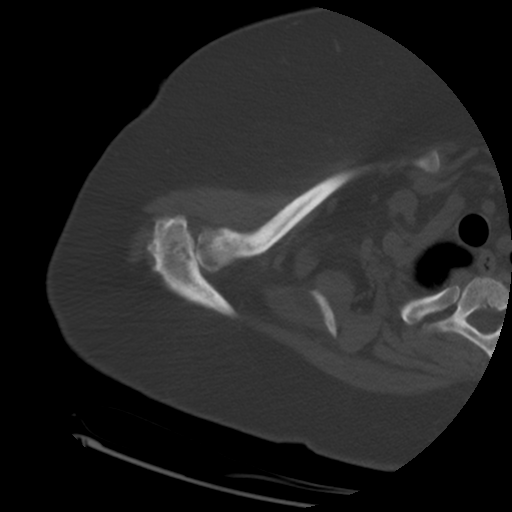

[Series 300: sag soft · oblique · 0.57mm/px · 4 of 123 slices shown]
[im 25/123  soft-tissue]
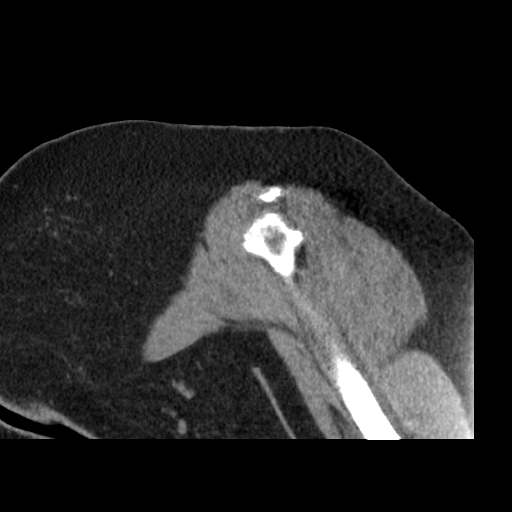
[im 49/123  soft-tissue]
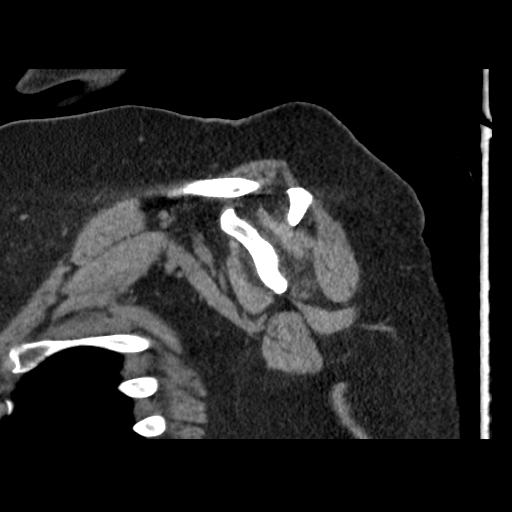
[im 74/123  soft-tissue]
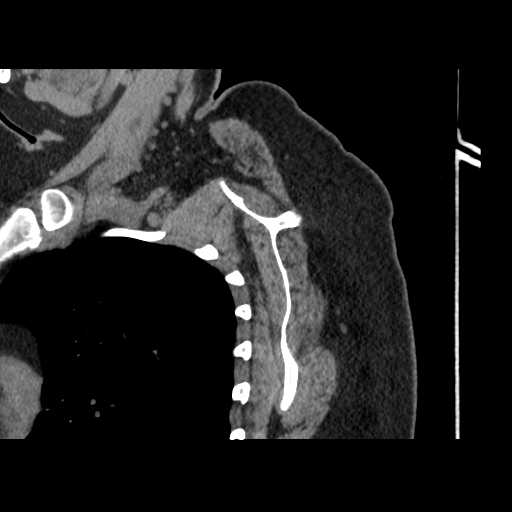
[im 98/123  soft-tissue]
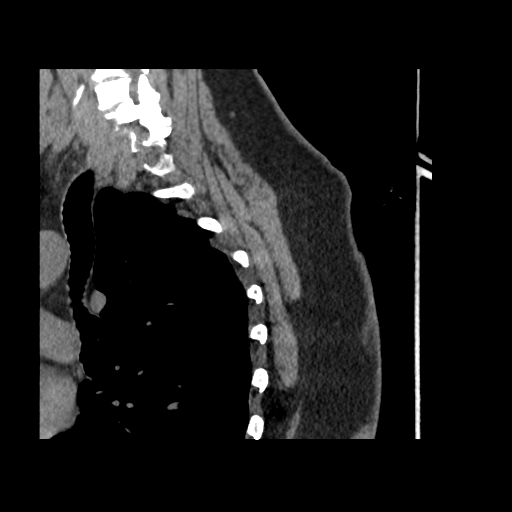

[Series 301: cor soft · oblique · 0.57mm/px · 2 of 98 slices shown]
[im 33/98  soft-tissue]
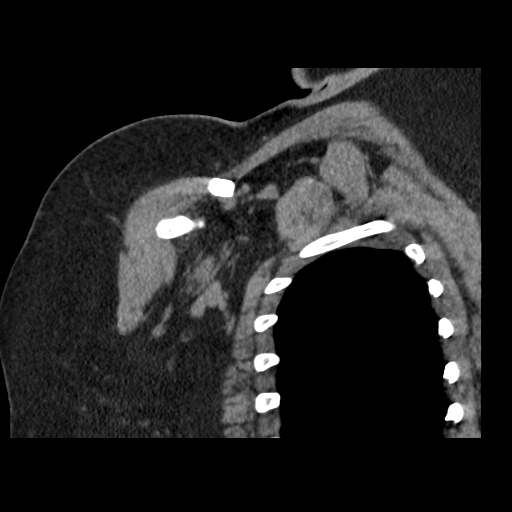
[im 65/98  soft-tissue]
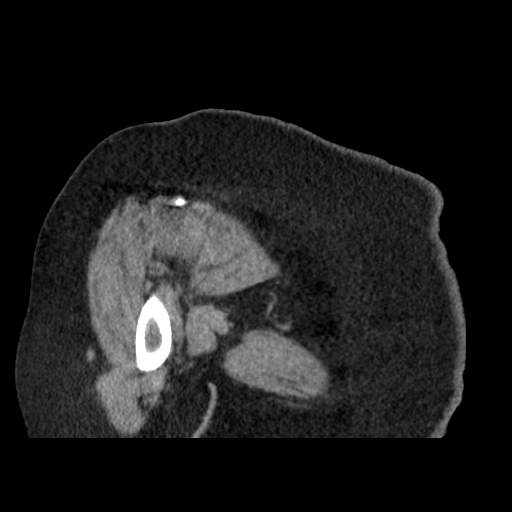

[8 of 14 positions shown; findings below may reference images not displayed]

FINDINGS: There is severe glenohumeral arthropathy with joint space loss,
osteophytes and subchondral cyst formation. There are several
intra-articular loose bodies within the joint, measuring up to
cm posteriorly. Only a small joint effusion is evident. There is
prominent intraosseous cyst formation centrally within the humeral
head.

The subacromial space of the right shoulder is obliterated
consistent with a chronic rotator cuff tear. There is moderate
atrophy of the supraspinatus and infraspinous muscles.

There are mild acromioclavicular degenerative changes. The
visualized right lung is clear. There is borderline dilatation of
the ascending aorta to 3.8 cm.
IMPRESSION: 1. Severe right glenohumeral arthropathy most consistent with
osteoarthritis or CPPD arthropathy. Given the bilaterality, a
neuropathic joint should be considered. Several intra-articular
loose bodies are present.
2. High riding humeral head with obliteration of the subacromial
space secondary to a chronic underlying rotator cuff tear. There is
associated atrophy of the supraspinatus and infraspinous muscles.

## 2015-10-29 ENCOUNTER — Encounter: Payer: Self-pay | Admitting: Physician Assistant

## 2015-11-01 ENCOUNTER — Encounter: Payer: Self-pay | Admitting: Physician Assistant

## 2015-12-02 ENCOUNTER — Telehealth (HOSPITAL_COMMUNITY): Payer: Self-pay | Admitting: *Deleted

## 2015-12-02 NOTE — Telephone Encounter (Signed)
Attempted to call patient to schedule appointment with [facility]. No one answered phone. Left voicemail for patient to call back.

## 2015-12-08 ENCOUNTER — Telehealth (HOSPITAL_COMMUNITY): Payer: Self-pay | Admitting: *Deleted

## 2015-12-08 NOTE — Telephone Encounter (Signed)
Patient returned call to [facility]. Patient did not qualify for [facility] due to income. [facility] if qualifies for [facility]ogram scholarship.

## 2016-02-14 IMAGING — RF DG HIP (WITH PELVIS) OPERATIVE*R*
1 series · 3 of 3 positions shown · non-contrast
Comparison: None.

CLINICAL DATA: Right total hip arthroplasty.

EXAM:
OPERATIVE right HIP (WITH PELVIS IF PERFORMED) 3 VIEWS
TECHNIQUE: Fluoroscopic spot image(s) were submitted for interpretation
post-operatively.

[Series 1: run · 3 of 3 slices shown]
[im 1/3]
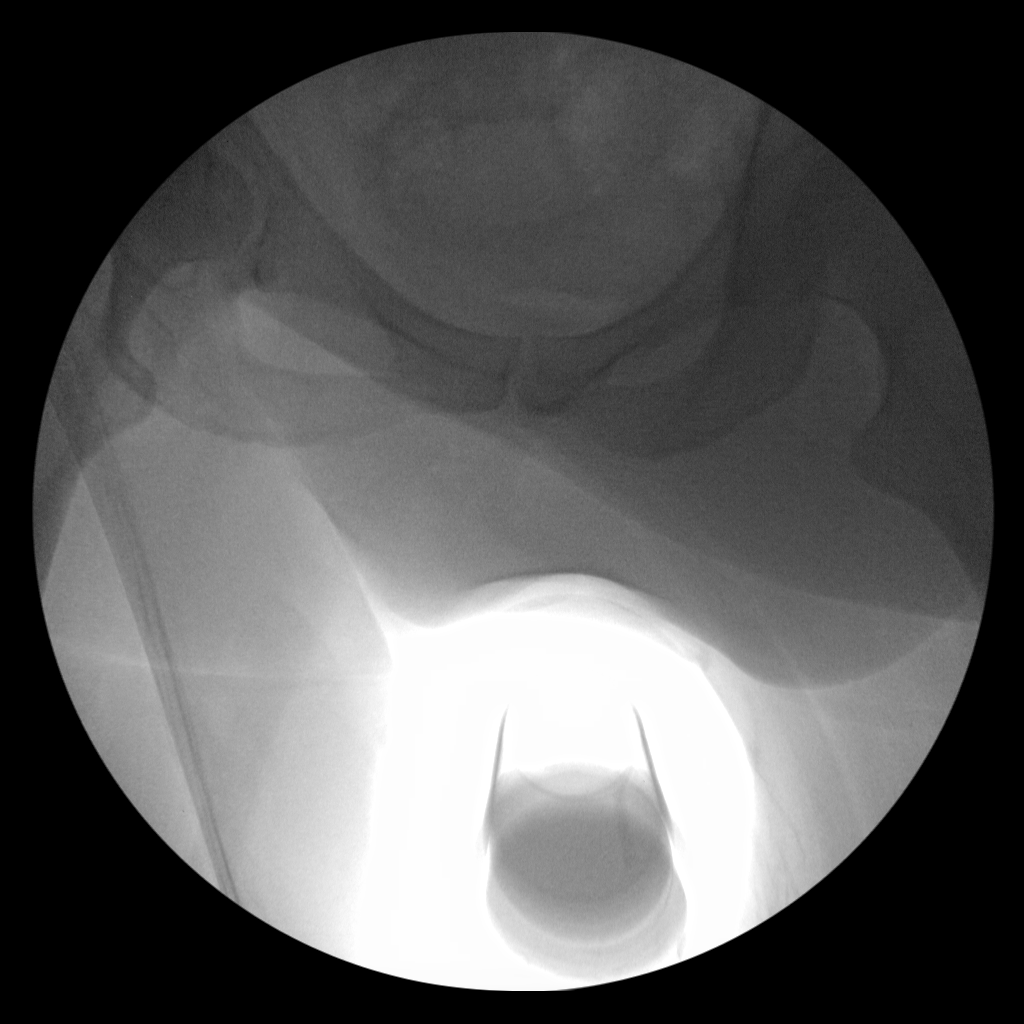
[im 2/3]
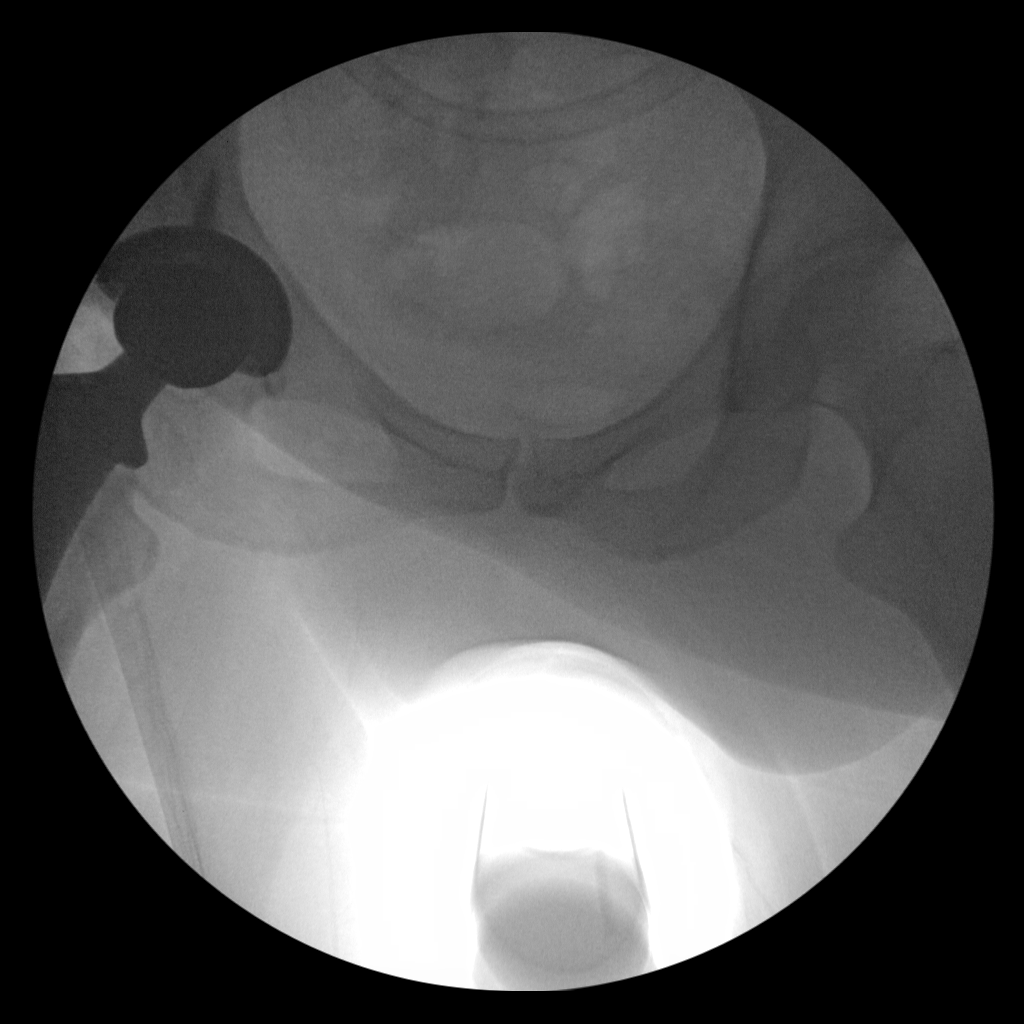
[im 3/3]
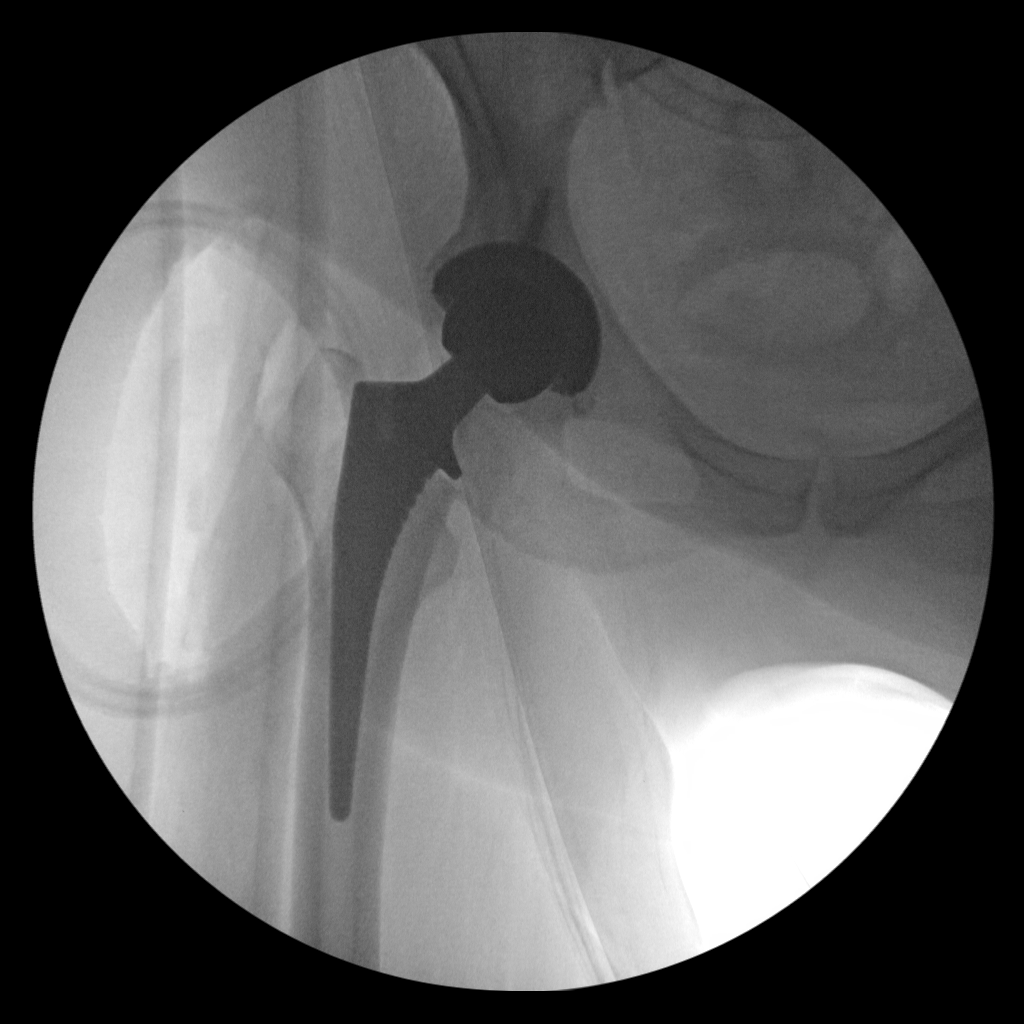

[3 of 3 positions shown; findings below may reference images not displayed]

FINDINGS: Patient is status post right total hip arthroplasty. The femoral and
acetabular components appear to be well situated.

FLUOROSCOPY TIME:  43 seconds.
IMPRESSION: Status post right total hip arthroplasty.

## 2016-02-14 IMAGING — CR DG PORTABLE PELVIS
1 series · 1 of 1 positions shown · non-contrast
Comparison: Pelvis CT dated 11/12/2006.

CLINICAL DATA: Status post right hip replacement.

EXAM:
PORTABLE PELVIS 1-2 VIEWS

[AP]
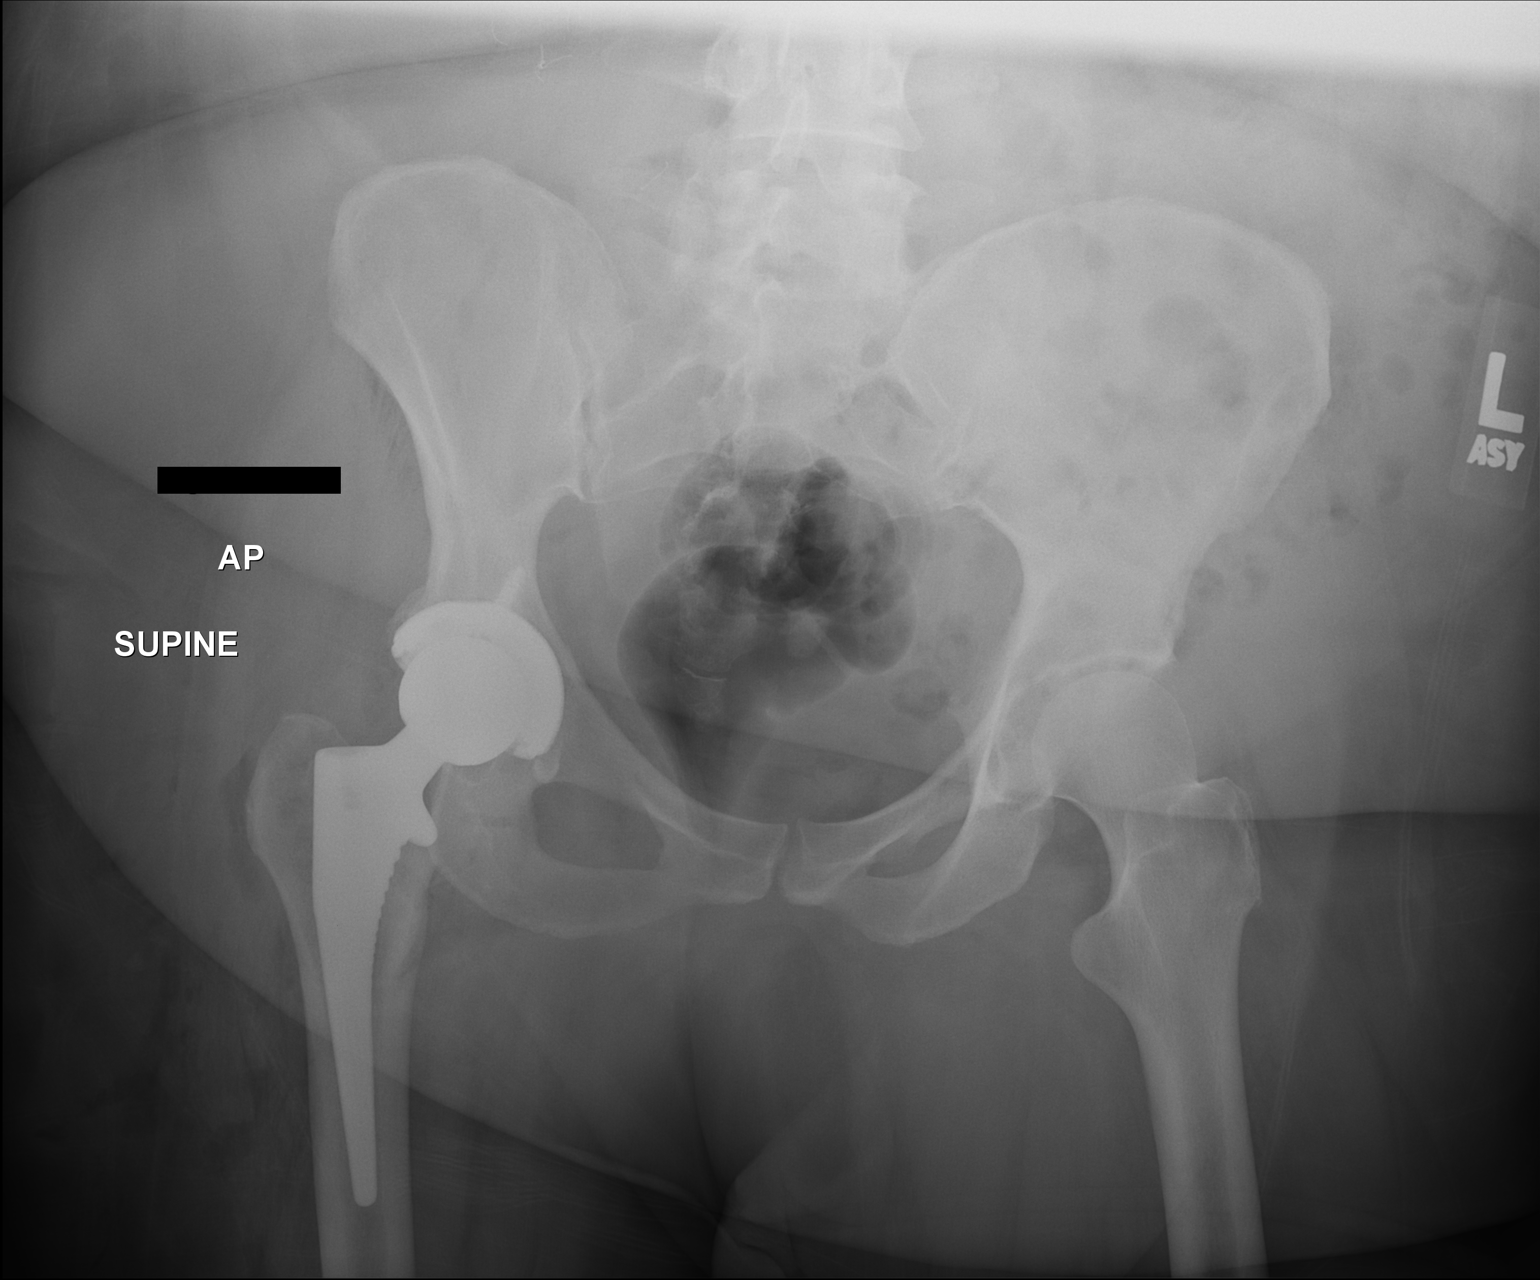

[1 of 1 positions shown; findings below may reference images not displayed]

FINDINGS: Interval right total hip prosthesis in satisfactory position and
alignment. Right lower abdominal broken wire sutures.
IMPRESSION: Satisfactory postoperative appearance of a right total hip
prosthesis.

## 2016-05-01 IMAGING — CR DG SHOULDER 2+V PORT*R*
1 series · 1 of 1 positions shown · non-contrast
Comparison: CT right shoulder 11/12/2013.

CLINICAL DATA: Status post right shoulder surgery.

EXAM:
PORTABLE RIGHT SHOULDER - 2+ VIEW

[AP]
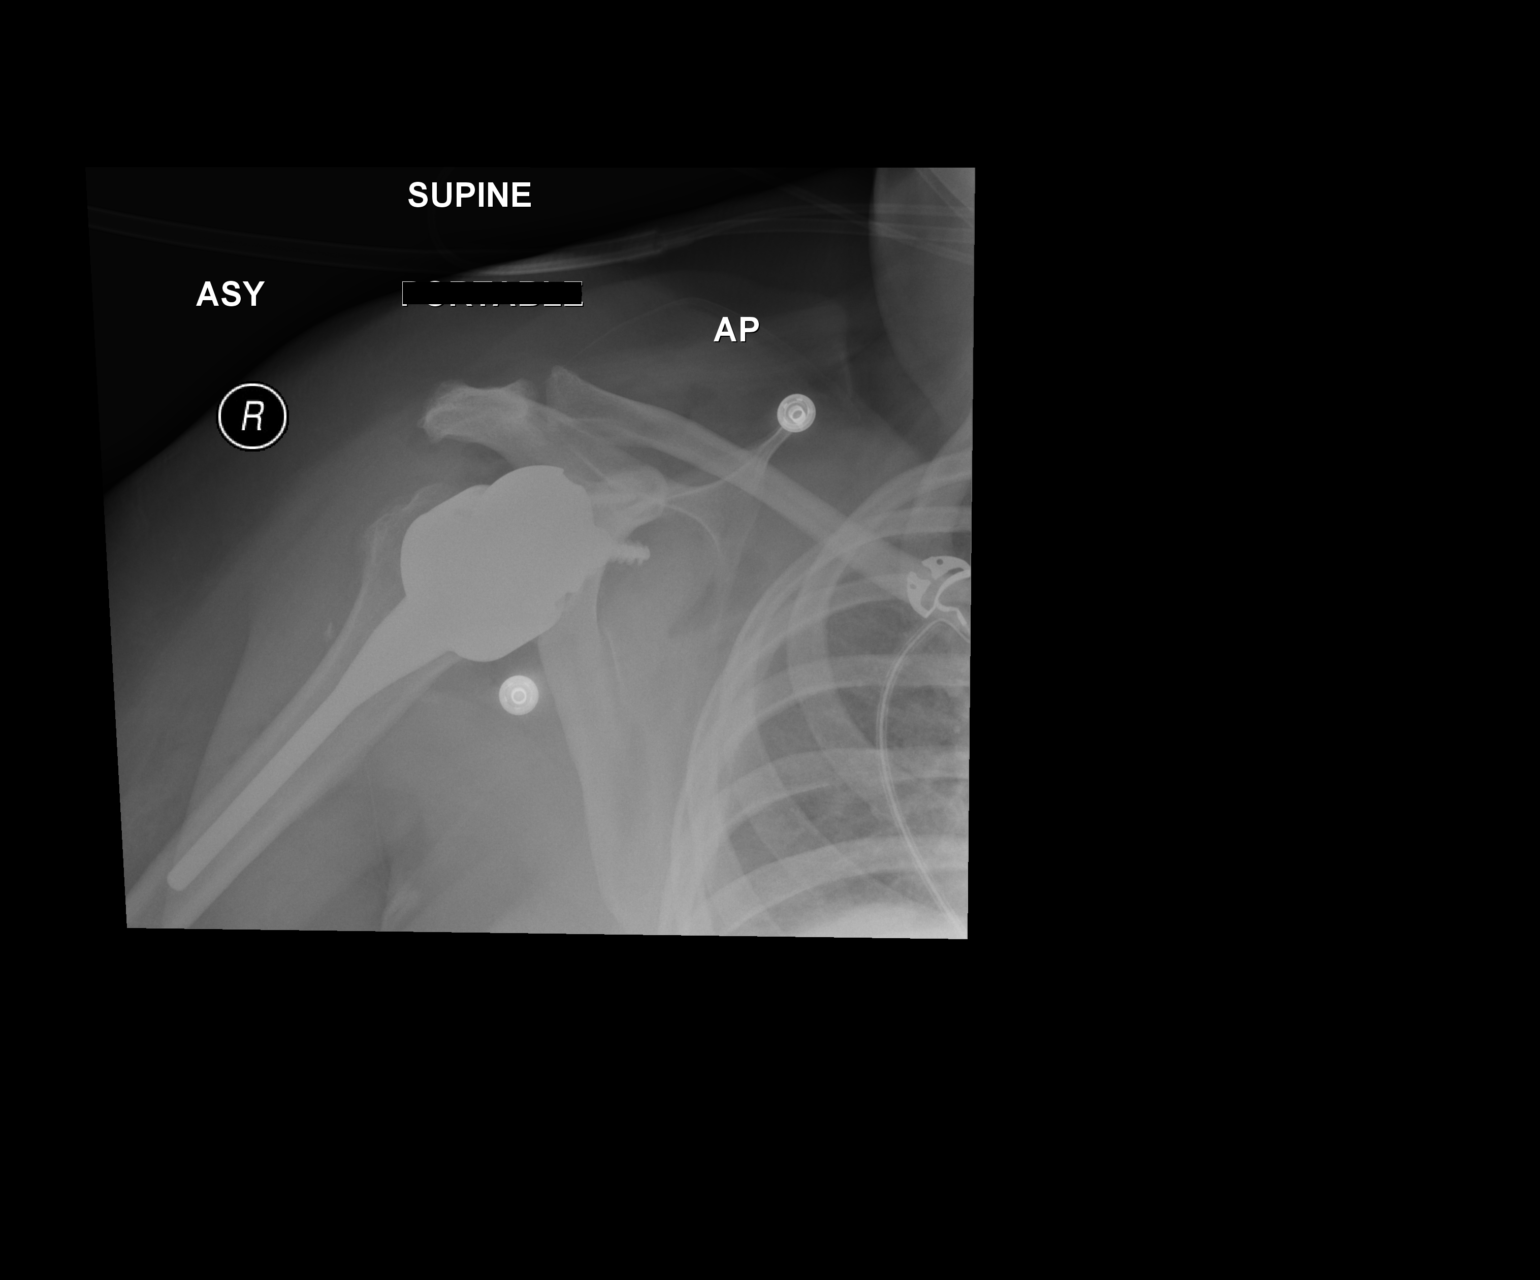

[1 of 1 positions shown; findings below may reference images not displayed]

FINDINGS: An AP portable view of the right shoulder demonstrates immediate
postoperative changes of right shoulder arthroplasty. No
periprosthetic fracture or hardware complication is identified.
There are degenerative changes of the acromioclavicular joint.
Acromioclavicular joint is aligned. Expected locules of soft tissue
gas are seen about the joint.
IMPRESSION: Immediate postoperative changes of right shoulder arthroplasty. No
complicating features.

## 2016-12-23 ENCOUNTER — Other Ambulatory Visit: Payer: Self-pay | Admitting: Physician Assistant

## 2016-12-23 DIAGNOSIS — Z1231 Encounter for screening mammogram for malignant neoplasm of breast: Secondary | ICD-10-CM

## 2017-01-25 ENCOUNTER — Ambulatory Visit
Admission: RE | Admit: 2017-01-25 | Discharge: 2017-01-25 | Disposition: A | Discharge status: Home / Self Care | Payer: 59 | Source: Ambulatory Visit | Attending: Physician Assistant | Admitting: Physician Assistant

## 2017-01-25 DIAGNOSIS — Z1231 Encounter for screening mammogram for malignant neoplasm of breast: Secondary | ICD-10-CM | POA: Diagnosis not present

## 2017-03-05 ENCOUNTER — Encounter: Payer: Self-pay | Admitting: Physician Assistant

## 2017-03-18 NOTE — Progress Notes (Signed)
Patient ID: Christine [name], female   DOB: 04-Mar-1955, 62 y.o.   MRN: [id number]  WELCOME TO MEDICARE  Assessment and Plan:  Essential hypertension - continue medications, DASH diet, exercise and monitor at home. Call if greater than 130/80.  -     CBC with Differential/Platelet -     BASIC METABOLIC PANEL WITH GFR -     Hepatic function panel -     TSH -     Urinalysis, Routine w reflex microscopic -     Microalbumin / creatinine urine ratio -     EKG 12-Lead -     [place], RETROPERITNL ABD,  LTD -     valsartan-hydrochlorothiazide (DIOVAN-HCT) 320-12.5 MG tablet; Take 1 tablet by mouth daily.  Hyperlipidemia, unspecified hyperlipidemia type -continue medications, check lipids, decrease fatty foods, increase activity.  -     Lipid panel  Gastroesophageal reflux disease, esophagitis presence not specified Continue PPI/H2 blocker, diet discussed  Hiatal hernia Continue PPI/H2 blocker, diet discussed  Medication management -     Magnesium  Vitamin D deficiency -     VITAMIN D 25 Hydroxy (Vit-D Deficiency, Fractures)  Encounter for general adult medical examination with abnormal findings  - [place], RETROPERITNL ABD,  LTD  Need for diphtheria-tetanus-pertussis (Tdap) vaccine -     Tdap vaccine greater than or equal to 7yo IM  Abnormal glucose Discussed disease progression and risks Discussed diet/exercise, weight management and risk modification -     Hemoglobin A1c  Morbid obesity (HCC) - follow up 3 months for progress monitoring - increase veggies, decrease carbs - long discussion about weight loss, diet, and exercise  Screening for HIV without presence of risk factors -     HIV antibody  Encounter for hepatitis C screening test for low risk patient -     Hepatitis C antibody  Osteoarthritis, unspecified osteoarthritis type, unspecified site Continue follow up Dr. Marlou Sa Weight loss advised -     meloxicam (MOBIC) 15 MG tablet; Take 1 tablet (15 mg total) by mouth  daily.  Mixed stress and urge urinary incontinence -     oxybutynin (DITROPAN) 5 MG tablet; Take 1 tablet (5 mg total) by mouth 3 (three) times daily.  Welcome to Medicare preventive visit 1 year  Advanced care planning/counseling discussion Discussed advanced care planning with patient and/or family At least 30 mins discussed with the patient and/or family at the visit.    Discussed med's effects and SE's. Screening labs and tests as requested with regular follow-up as recommended.  Plan:   During the course of the visit the patient was educated and counseled about appropriate screening and preventive services including:    Pneumococcal vaccine   Prevnar 13  Influenza vaccine  Td vaccine  Screening electrocardiogram  Bone densitometry screening  Colorectal cancer screening  Diabetes screening  Glaucoma screening  Nutrition counseling   Advanced directives: requested   HPI  62 y.o. female  presents for a welcome to medicare  She has had hip and shoulder replacement with Dr. Marlou Sa, she is doing very well with this.   Her blood pressure has been controlled at home, today their BP is BP: 140/82.  She does workout. She denies chest pain, shortness of breath, dizziness.   She is on cholesterol medication and denies myalgias. Her cholesterol is at goal. The cholesterol last visit was:  Lab Results  Component Value Date   CHOL 155 09/25/2014   HDL 28 (L) 09/25/2014   LDLCALC 94 09/25/2014  TRIG 163 (H) 09/25/2014   CHOLHDL 5.5 (H) 09/25/2014  . She has been working on diet and exercise for prediabetes, she is not on bASA, she is not on ACE/ARB and denies foot ulcerations, hyperglycemia, hypoglycemia , increased appetite, nausea, paresthesia of the feet, polydipsia, polyuria, visual disturbances, vomiting and weight loss. Last A1C in the office was:  Lab Results  Component Value Date   HGBA1C 5.9 (H) 09/25/2014   Patient is on Vitamin D supplement.   Lab  Results  Component Value Date   VD25OH 33 09/25/2014     BMI is Body mass index is 48.82 kg/m., she is working on diet and exercise. Wt Readings from Last 3 Encounters:  03/22/17 275 lb 9.6 oz (125 kg)  12/19/14 255 lb (115.7 kg)  09/27/14 247 lb (112 kg)    Current Medications:  Current Outpatient Medications on File Prior to Visit  Medication Sig  . cholecalciferol (VITAMIN D) 1000 UNITS tablet Take 1,000 Units by mouth daily.  . cyclobenzaprine (FLEXERIL) 10 MG tablet Take 1 tablet (10 mg total) by mouth 3 (three) times daily as needed for muscle spasms.  . diphenhydramine-acetaminophen (TYLENOL PM) 25-500 MG TABS Take 2 tablets by mouth at bedtime as needed.  . Lactobacillus Casei-Folic Acid 42-6.83 MG CAPS 1 pill daily  . magnesium gluconate (MAGONATE) 500 MG tablet Take 500 mg by mouth 2 (two) times daily.  . lansoprazole (PREVACID) 30 MG capsule Take 1 capsule (30 mg total) by mouth 2 (two) times daily before a meal.   No current facility-administered medications on file prior to visit.     Health Maintenance:   Immunization History  Administered Date(s) Administered  . Influenza-Unspecified 11/22/2014, 10/31/2015  . PPD Test 06/01/2013  . Tdap 03/22/2017   TDAP today Influenza Oct 30th 2018 at [facility] Pneumovax at 24 Prevnar 13 at 71  Colonoscopy 2016 MGM Jan 2019 DEXA 05/2013 PAP 2013 normal, neg HPV- DUE  Patient Care Team: [name], MD as PCP - General (Internal Medicine) Marlou Sa, Tonna Corner, MD as Consulting Physician (Orthopedic Surgery) Sable Feil, MD as Consulting Physician (Gastroenterology) Allyn Kenner, DO as Consulting Physician (Obstetrics and Gynecology) Rutherford Guys, MD as Consulting Physician (Ophthalmology) Rolm Bookbinder, MD as Consulting Physician (Dermatology)  Medical History:  Past Medical History:  Diagnosis Date  . Arthritis   . Asthma   . Environmental allergies   . GERD (gastroesophageal reflux disease)   .  Headache   . Hiatal hernia    with stricture dilation  . Hyperlipidemia   . Hypertension   . Kidney stones 2008  . Obesity, unspecified   . PONV (postoperative nausea and vomiting)   . UTI (urinary tract infection)    RECENTLY TX WITH ANTIBIOTIC BY PCP    Allergies Allergies  Allergen Reactions  . Keflex [Cephalexin] Anaphylaxis and Swelling  . Codeine     REACTION: nausea/vomiting    SURGICAL HISTORY She  has a past surgical history that includes Cholecystectomy (1987); Esophageal dilation; Knee arthroscopy (Right, 2007); Total hip arthroplasty (Right, 03/19/2014); and Reverse shoulder arthroplasty (Right, 06/04/2014). FAMILY HISTORY Her family history includes Cancer in her father; Heart disease in her brother and mother; Hyperlipidemia in her mother; Hypertension in her mother; Pulmonary embolism in her brother; Stroke in her mother; Throat cancer in her father. SOCIAL HISTORY She  reports that  has never smoked. she has never used smokeless tobacco. She reports that she does not drink alcohol or use drugs.  Review of Systems: Review of  Systems  Constitutional: Negative for chills, fever and malaise/fatigue.  HENT: Negative for congestion, ear discharge, ear pain and sore throat.   Eyes: Negative.   Respiratory: Negative for cough, shortness of breath and wheezing.   Cardiovascular: Negative for chest pain, palpitations, claudication and leg swelling.  Gastrointestinal: Negative for blood in stool, constipation, diarrhea, heartburn, melena, nausea and vomiting.  Genitourinary: Negative for urgency.  Musculoskeletal: Negative for joint pain.  Skin: Negative.   Neurological: Negative for dizziness, sensory change, loss of consciousness and headaches.  Psychiatric/Behavioral: Negative for depression. The patient is not nervous/anxious and does not have insomnia.    MEDICARE WELLNESS OBJECTIVES: Physical activity: Current Exercise Habits: The patient does not participate in  regular exercise at present, Exercise limited by: orthopedic condition(s) Cardiac risk factors: Cardiac Risk Factors include: advanced age (>56men, >55 women);dyslipidemia;hypertension;obesity (BMI >30kg/m2);sedentary lifestyle Depression/mood screen:   Depression screen [facility] 2/9 03/22/2017  Decreased Interest 0  Down, Depressed, Hopeless 0  PHQ - 2 Score 0    ADLs:  In your present state of health, do you have any difficulty performing the following activities: 03/22/2017  Hearing? N  Vision? N  Difficulty concentrating or making decisions? N  Walking or climbing stairs? Y  Comment bilateral knee pain, left worse  Dressing or bathing? N  Doing errands, shopping? N  Some recent data might be hidden     Cognitive Testing  Alert? Yes  Normal Appearance?Yes  Oriented to person? Yes  Place? Yes   Time? Yes  Recall of three objects?  Yes  Can perform simple calculations? Yes  Displays appropriate judgment?Yes  Can read the correct time from a watch face?Yes  EOL planning: Does Patient Have a Medical Advance Directive?: No Would patient like information on creating a medical advance directive?: Yes (MAU/Ambulatory/Procedural Areas - Information given)   Physical Exam: Estimated body mass index is 48.82 kg/m as calculated from the following:   Height as of this encounter: 5\' 3"  (1.6 m).   Weight as of this encounter: 275 lb 9.6 oz (125 kg). BP 140/82   Pulse 88   Temp 97.7 F (36.5 C)   Ht 5\' 3"  (1.6 m)   Wt 275 lb 9.6 oz (125 kg)   LMP 10/18/2013   SpO2 97%   BMI 48.82 kg/m   General Appearance: Well nourished well developed, in no apparent distress.  Eyes: PERRLA, EOMs, conjunctiva no swelling or erythema ENT/Mouth: Ear canals normal without obstruction, swelling, erythema, or discharge.  TMs normal bilaterally with no erythema, bulging, retraction, or loss of landmark.  Oropharynx moist and clear with no exudate, erythema, or swelling.   Neck: Supple, thyroid normal. No  bruits.  No cervical adenopathy Respiratory: Respiratory effort normal, Breath sounds clear A&P without wheeze, rhonchi, rales.   Cardio: RRR without murmurs, rubs or gallops. Brisk peripheral pulses without edema.  Chest: symmetric, with normal excursions Breasts: Symmetric, without lumps, nipple discharge, retractions.  Abdomen: Morbidly obese, well healing surgical scar over the RUQ, Soft, nontender, no guarding, rebound, hernias, masses, or organomegaly.  Lymphatics: Non tender without lymphadenopathy.  Genitourinary: defer Musculoskeletal: Limited ROM in upper extremities 4/5 strength, and normal gait.  Skin: Warm, dry without rashes, lesions, ecchymosis. Neuro: Awake and oriented X 3, Cranial nerves intact, reflexes equal bilaterally. Normal muscle tone, no cerebellar symptoms. Sensation intact.  Psych:  normal affect, Insight and Judgment appropriate.   EKG: WNL no changes.  AORTA SCAN: WNL   Medicare Attestation I have personally reviewed: The patient's medical and  social history Their use of alcohol, tobacco or illicit drugs Their current medications and supplements The patient's functional ability including ADLs,fall risks, home safety risks, cognitive, and hearing and visual impairment Diet and physical activities Evidence for depression or mood disorders  The patient's weight, height, BMI, and visual acuity have been recorded in the chart.  I have made referrals, counseling, and provided education to the patient based on review of the above and I have provided the patient with a written personalized care plan for preventive services.    Vicie Mutters 10:50 AM [facility]

## 2017-03-22 ENCOUNTER — Encounter: Payer: Self-pay | Admitting: Physician Assistant

## 2017-03-22 ENCOUNTER — Ambulatory Visit (INDEPENDENT_AMBULATORY_CARE_PROVIDER_SITE_OTHER): Payer: Medicare HMO | Admitting: Physician Assistant

## 2017-03-22 VITALS — BP 140/82 | HR 88 | Temp 97.7°F | Ht 63.0 in | Wt 275.6 lb

## 2017-03-22 DIAGNOSIS — Z7189 Other specified counseling: Secondary | ICD-10-CM | POA: Diagnosis not present

## 2017-03-22 DIAGNOSIS — Z0000 Encounter for general adult medical examination without abnormal findings: Secondary | ICD-10-CM

## 2017-03-22 DIAGNOSIS — R7309 Other abnormal glucose: Secondary | ICD-10-CM | POA: Diagnosis not present

## 2017-03-22 DIAGNOSIS — Z23 Encounter for immunization: Secondary | ICD-10-CM | POA: Diagnosis not present

## 2017-03-22 DIAGNOSIS — R69 Illness, unspecified: Secondary | ICD-10-CM | POA: Diagnosis not present

## 2017-03-22 DIAGNOSIS — K219 Gastro-esophageal reflux disease without esophagitis: Secondary | ICD-10-CM | POA: Diagnosis not present

## 2017-03-22 DIAGNOSIS — E559 Vitamin D deficiency, unspecified: Secondary | ICD-10-CM | POA: Diagnosis not present

## 2017-03-22 DIAGNOSIS — Z114 Encounter for screening for human immunodeficiency virus [HIV]: Secondary | ICD-10-CM | POA: Diagnosis not present

## 2017-03-22 DIAGNOSIS — Z79899 Other long term (current) drug therapy: Secondary | ICD-10-CM

## 2017-03-22 DIAGNOSIS — R6889 Other general symptoms and signs: Secondary | ICD-10-CM | POA: Diagnosis not present

## 2017-03-22 DIAGNOSIS — N3946 Mixed incontinence: Secondary | ICD-10-CM | POA: Insufficient documentation

## 2017-03-22 DIAGNOSIS — R8271 Bacteriuria: Secondary | ICD-10-CM

## 2017-03-22 DIAGNOSIS — I10 Essential (primary) hypertension: Secondary | ICD-10-CM | POA: Diagnosis not present

## 2017-03-22 DIAGNOSIS — M1990 Unspecified osteoarthritis, unspecified site: Secondary | ICD-10-CM | POA: Diagnosis not present

## 2017-03-22 DIAGNOSIS — K449 Diaphragmatic hernia without obstruction or gangrene: Secondary | ICD-10-CM

## 2017-03-22 DIAGNOSIS — E785 Hyperlipidemia, unspecified: Secondary | ICD-10-CM | POA: Diagnosis not present

## 2017-03-22 DIAGNOSIS — Z0001 Encounter for general adult medical examination with abnormal findings: Secondary | ICD-10-CM | POA: Diagnosis not present

## 2017-03-22 DIAGNOSIS — Z136 Encounter for screening for cardiovascular disorders: Secondary | ICD-10-CM | POA: Diagnosis not present

## 2017-03-22 DIAGNOSIS — Z1159 Encounter for screening for other viral diseases: Secondary | ICD-10-CM | POA: Diagnosis not present

## 2017-03-22 MED ORDER — OXYBUTYNIN CHLORIDE 5 MG PO TABS: 5.0000 mg | ORAL_TABLET | Freq: Three times a day (TID) | ORAL | 2 refills | Status: DC

## 2017-03-22 MED ORDER — MELOXICAM 15 MG PO TABS: 15.0000 mg | ORAL_TABLET | Freq: Every day | ORAL | 3 refills | Status: DC

## 2017-03-22 MED ORDER — VALSARTAN-HYDROCHLOROTHIAZIDE 320-12.5 MG PO TABS: 1.0000 | ORAL_TABLET | Freq: Every day | ORAL | 3 refills | Status: DC

## 2017-03-22 NOTE — Patient Instructions (Addendum)
You can take tylenol (500mg ) or tylenol arthritis (650mg ) with the meloxicam/antiinflammatories. The max you can take of tylenol a day is 3000mg  daily, this is a max of 6 pills a day of the regular tyelnol (500mg ) or a max of 4 a day of the tylenol arthritis (650mg ) as long as no other medications you are taking contain tylenol.   Mobic you can take 1/2 pill twice a day OR just the once a day Can supplement with tyelnol- see above  Intermittent fasting is more about strategy than starvation. It's meant to reset your body in different ways, hopefully with fitness and nutrition changes as a result.  Like any big switchover, though, results may vary when it comes down to the individual level. What works for your friends may not work for you, or vice versa. That's why it's helpful to play around with variations on intermittent fasting and healthy habits and find what works best for you.  WHAT IS INTERMITTENT FASTING AND WHY DO IT?  Intermittent fasting doesn't involve specific foods, but rather, a strict schedule regarding when you eat. Also called "time-restricted eating," the tactic has been praised for its contribution to weight loss, improved body composition, and decreased cravings. Preliminary research also suggests it may be beneficial for glucose tolerance, hormone regulation, better muscle mass and lower body fat.  Part of its appeal is the simplicity of the effort. Unlike some other trends, there's no calculations to intermittent fasting.  You simply eat within a certain block of time, usually a window of 8-10 hours. In the other big block of time - about 14-16 hours, including when you're asleep - you don't eat anything, not even snacks. You can drink water, coffee, tea or any other beverage that doesn't have calories.  For example, if you like having a late dinner, you might skip breakfast and have your first meal at noon and your last meal of the day at 8 p.m., and then not eat until noon  again the next day.  IDEAS FOR GETTING STARTED  If you're new to the strategy, it may be helpful to eat within the typical circadian rhythm and keep eating within daylight hours. This can be especially beneficial if you're looking at intermittent fasting for weight-loss goals.  So first try only eating between 12pm to 8pm.  Outside of this time you may have water, black coffee, and hot tea. You may not eat it drink anything that has carbs, sugars, OR artificial sugars like diet soda.   Like any major eating and fitness shift, it can take time to find the perfect fit, so don't be afraid to experiment with different options - including ditching intermittent fasting altogether if it's simply not for you. But if it is, you may be surprised by some of the benefits that come along with the strategy.  Being dehydrated can hurt your kidneys, cause fatigue, headaches, muscle aches, joint pain, and dry skin/nails so please increase your fluids.   Drink 80-100 oz a day of water, measure it out!  Can check out plantnanny app on your phone to help you keep track of your water     When it comes to diets, agreement about the perfect plan isn't easy to find, even among the experts. Experts at the [employer] developed an idea known as the Healthy Eating Plate. Just imagine a plate divided into logical, healthy portions.  The emphasis is on diet quality:  Load up on vegetables and fruits -  one-half of your plate: Aim for color and variety, and remember that potatoes don't count.  Go for whole grains - one-quarter of your plate: Whole wheat, barley, wheat berries, quinoa, oats, brown rice, and foods made with them. If you want pasta, go with whole wheat pasta.  Protein power - one-quarter of your plate: Fish, chicken, beans, and nuts are all healthy, versatile protein sources. Limit red meat.  The diet, however, does go beyond the plate, offering a few other suggestions.  Use  healthy plant oils, such as olive, canola, soy, corn, sunflower and peanut. Check the labels, and avoid partially hydrogenated oil, which have unhealthy trans fats.  If you're thirsty, drink water. Coffee and tea are good in moderation, but skip sugary drinks and limit milk and dairy products to one or two daily servings.  The type of carbohydrate in the diet is more important than the amount. Some sources of carbohydrates, such as vegetables, fruits, whole grains, and beans-are healthier than others.  Finally, stay active.  Veggies are great because you can eat a ton! They are low in calories, great to fill you up, and have a ton of vitamins, minerals, and protein.

## 2017-03-23 ENCOUNTER — Encounter: Payer: Self-pay | Admitting: Physician Assistant

## 2017-03-23 ENCOUNTER — Other Ambulatory Visit: Payer: Self-pay

## 2017-03-23 DIAGNOSIS — R8271 Bacteriuria: Secondary | ICD-10-CM

## 2017-03-23 LAB — CBC WITH DIFFERENTIAL/PLATELET
BASOS PCT: 0.8 %
Basophils Absolute: 52 cells/uL (ref 0–200)
EOS PCT: 4.5 %
Eosinophils Absolute: 293 cells/uL (ref 15–500)
HCT: 43.4 % (ref 35.0–45.0)
Hemoglobin: 14.5 g/dL (ref 11.7–15.5)
Lymphs Abs: 1638 cells/uL (ref 850–3900)
MCH: 29.4 pg (ref 27.0–33.0)
MCHC: 33.4 g/dL (ref 32.0–36.0)
MCV: 87.9 fL (ref 80.0–100.0)
MPV: 11 fL (ref 7.5–12.5)
Monocytes Relative: 8.9 %
Neutro Abs: 3939 cells/uL (ref 1500–7800)
Neutrophils Relative %: 60.6 %
Platelets: 232 10*3/uL (ref 140–400)
RBC: 4.94 10*6/uL (ref 3.80–5.10)
RDW: 12.9 % (ref 11.0–15.0)
TOTAL LYMPHOCYTE: 25.2 %
WBC mixed population: 579 cells/uL (ref 200–950)
WBC: 6.5 10*3/uL (ref 3.8–10.8)

## 2017-03-23 LAB — HEMOGLOBIN A1C
Hgb A1c MFr Bld: 5.5 % of total Hgb (ref <5.7)
Mean Plasma Glucose: 111 (calc)
eAG (mmol/L): 6.2 (calc)

## 2017-03-23 LAB — BASIC METABOLIC PANEL WITH GFR
BUN: 17 mg/dL (ref 7–25)
CALCIUM: 9.4 mg/dL (ref 8.6–10.4)
CHLORIDE: 104 mmol/L (ref 98–110)
CO2: 27 mmol/L (ref 20–32)
Creat: 0.86 mg/dL (ref 0.50–1.05)
GFR, Est African American: 86 mL/min/{1.73_m2} (ref >=60)
GFR, Est Non African American: 74 mL/min/{1.73_m2} (ref >=60)
Glucose, Bld: 90 mg/dL (ref 65–99)
Potassium: 4.1 mmol/L (ref 3.5–5.3)
Sodium: 139 mmol/L (ref 135–146)

## 2017-03-23 LAB — HEPATIC FUNCTION PANEL
AG Ratio: 1.3 (calc) (ref 1.0–2.5)
ALBUMIN MSPROF: 4 g/dL (ref 3.6–5.1)
ALT: 89 U/L — AB (ref 6–29)
AST: 76 U/L — ABNORMAL HIGH (ref 10–35)
Alkaline phosphatase (APISO): 76 U/L (ref 33–130)
Bilirubin, Direct: 0.2 mg/dL (ref 0.0–0.2)
GLOBULIN: 3.2 g/dL (ref 1.9–3.7)
Indirect Bilirubin: 0.6 mg/dL (calc) (ref 0.2–1.2)
TOTAL PROTEIN: 7.2 g/dL (ref 6.1–8.1)
Total Bilirubin: 0.8 mg/dL (ref 0.2–1.2)

## 2017-03-23 LAB — URINALYSIS, ROUTINE W REFLEX MICROSCOPIC
Bilirubin Urine: NEGATIVE
Glucose, UA: NEGATIVE
HYALINE CAST: NONE SEEN /LPF
KETONES UR: NEGATIVE
Nitrite: POSITIVE — AB
Protein, ur: NEGATIVE
RBC / HPF: NONE SEEN /HPF (ref 0–2)
SPECIFIC GRAVITY, URINE: 1.008 (ref 1.001–1.03)
Squamous Epithelial / LPF: NONE SEEN /HPF (ref <=5)
pH: 6 (ref 5.0–8.0)

## 2017-03-23 LAB — HEPATITIS C ANTIBODY
Hepatitis C Ab: NONREACTIVE
SIGNAL TO CUT-OFF: 0.02 (ref <1.00)

## 2017-03-23 LAB — LIPID PANEL
Cholesterol: 196 mg/dL (ref <200)
HDL: 42 mg/dL — ABNORMAL LOW (ref >50)
LDL CHOLESTEROL (CALC): 128 mg/dL — AB
Non-HDL Cholesterol (Calc): 154 mg/dL (calc) — ABNORMAL HIGH (ref <130)
Total CHOL/HDL Ratio: 4.7 (calc) (ref <5.0)
Triglycerides: 150 mg/dL — ABNORMAL HIGH (ref <150)

## 2017-03-23 LAB — TSH: TSH: 2.52 m[IU]/L (ref 0.40–4.50)

## 2017-03-23 LAB — MAGNESIUM: Magnesium: 2 mg/dL (ref 1.5–2.5)

## 2017-03-23 LAB — VITAMIN D 25 HYDROXY (VIT D DEFICIENCY, FRACTURES): Vit D, 25-Hydroxy: 28 ng/mL — ABNORMAL LOW (ref 30–100)

## 2017-03-23 LAB — MICROALBUMIN / CREATININE URINE RATIO
Creatinine, Urine: 36 mg/dL (ref 20–275)
MICROALB/CREAT RATIO: 28 ug/mg{creat} (ref <30)
Microalb, Ur: 1 mg/dL

## 2017-03-23 LAB — HIV ANTIBODY (ROUTINE TESTING W REFLEX): HIV: NONREACTIVE

## 2017-03-23 NOTE — Addendum Note (Signed)
Addended by: Eulis Canner on: 03/23/2017 08:54 AM   Modules accepted: Orders

## 2017-03-24 ENCOUNTER — Encounter: Payer: Self-pay | Admitting: Physician Assistant

## 2017-03-25 LAB — URINE CULTURE
MICRO NUMBER:: 90353695
SPECIMEN QUALITY: ADEQUATE

## 2017-03-28 ENCOUNTER — Other Ambulatory Visit: Payer: Self-pay | Admitting: Physician Assistant

## 2017-03-28 ENCOUNTER — Encounter: Payer: Self-pay | Admitting: Physician Assistant

## 2017-03-28 ENCOUNTER — Telehealth (INDEPENDENT_AMBULATORY_CARE_PROVIDER_SITE_OTHER): Payer: Self-pay | Admitting: Orthopedic Surgery

## 2017-03-28 MED ORDER — SULFAMETHOXAZOLE-TRIMETHOPRIM 800-160 MG PO TABS: 1.0000 | ORAL_TABLET | Freq: Two times a day (BID) | ORAL | 0 refills | Status: DC

## 2017-03-28 NOTE — Telephone Encounter (Signed)
Patient requesting cortisone inj in her left knee, appt made for 4/4.

## 2017-03-29 ENCOUNTER — Encounter: Payer: Self-pay | Admitting: Physician Assistant

## 2017-03-30 ENCOUNTER — Encounter: Payer: Self-pay | Admitting: Physician Assistant

## 2017-04-07 ENCOUNTER — Ambulatory Visit (INDEPENDENT_AMBULATORY_CARE_PROVIDER_SITE_OTHER): Payer: Medicare HMO

## 2017-04-07 ENCOUNTER — Encounter (INDEPENDENT_AMBULATORY_CARE_PROVIDER_SITE_OTHER): Payer: Self-pay | Admitting: Orthopedic Surgery

## 2017-04-07 ENCOUNTER — Ambulatory Visit (INDEPENDENT_AMBULATORY_CARE_PROVIDER_SITE_OTHER): Payer: Medicare HMO | Admitting: Orthopedic Surgery

## 2017-04-07 DIAGNOSIS — G8929 Other chronic pain: Secondary | ICD-10-CM

## 2017-04-07 DIAGNOSIS — M25562 Pain in left knee: Secondary | ICD-10-CM | POA: Diagnosis not present

## 2017-04-07 DIAGNOSIS — M1711 Unilateral primary osteoarthritis, right knee: Secondary | ICD-10-CM | POA: Diagnosis not present

## 2017-04-07 DIAGNOSIS — M1712 Unilateral primary osteoarthritis, left knee: Secondary | ICD-10-CM | POA: Diagnosis not present

## 2017-04-10 ENCOUNTER — Encounter (INDEPENDENT_AMBULATORY_CARE_PROVIDER_SITE_OTHER): Payer: Self-pay | Admitting: Orthopedic Surgery

## 2017-04-10 DIAGNOSIS — M1711 Unilateral primary osteoarthritis, right knee: Secondary | ICD-10-CM | POA: Diagnosis not present

## 2017-04-10 DIAGNOSIS — G8929 Other chronic pain: Secondary | ICD-10-CM | POA: Diagnosis not present

## 2017-04-10 DIAGNOSIS — M1712 Unilateral primary osteoarthritis, left knee: Secondary | ICD-10-CM

## 2017-04-10 MED ORDER — BUPIVACAINE HCL 0.25 % IJ SOLN
4.0000 mL | INTRAMUSCULAR | Status: AC | PRN
  Administered 2017-04-10: 4 mL via INTRA_ARTICULAR

## 2017-04-10 MED ORDER — LIDOCAINE HCL 1 % IJ SOLN
5.0000 mL | INTRAMUSCULAR | Status: AC | PRN
  Administered 2017-04-10: 5 mL

## 2017-04-10 MED ORDER — METHYLPREDNISOLONE ACETATE 40 MG/ML IJ SUSP
40.0000 mg | INTRAMUSCULAR | Status: AC | PRN
  Administered 2017-04-10: 40 mg via INTRA_ARTICULAR

## 2017-04-10 NOTE — Progress Notes (Signed)
Office Visit Note   Patient: Christine [name]           Date of Birth: 02/16/1955           MRN: [id number] Visit Date: 04/07/2017 Requested by: [name], [address] [place], [place] [address] PCP: [name], MD  Subjective: Chief Complaint  Patient presents with  . Left Knee - Pain    HPI: Ginger is a patient with left knee pain.  She has known history of left knee arthritis.  Reports atraumatic onset of pain for several weeks.  Denies any locking.  Several years since last injection.              ROS: All systems reviewed are negative as they relate to the chief complaint within the history of present illness.  Patient denies  fevers or chills.   Assessment & Plan: Visit Diagnoses:  1. Chronic pain of left knee   2. Unilateral primary osteoarthritis, left knee   3. Unilateral primary osteoarthritis, right knee     Plan: Impression is end-stage bilateral knee arthritis with more severe symptoms on the left-hand side.  Plan is to inject that left knee.  She has had hip replacements.  Knee replacements may be next but only when she is really ready from a clinical perspective.  For now she is making do with these injections.  I will see her back as needed  Follow-Up Instructions: Return if symptoms worsen or fail to improve.   Orders:  Orders Placed This Encounter  Procedures  . XR KNEE 3 VIEW LEFT   No orders of the defined types were placed in this encounter.     Procedures: Large Joint Inj: L knee on 04/10/2017 11:54 PM Indications: diagnostic evaluation, joint swelling and pain Details: 18 G 1.5 in needle, superolateral approach  Arthrogram: No  Medications: 5 mL lidocaine 1 %; 40 mg methylPREDNISolone acetate 40 MG/ML; 4 mL bupivacaine 0.25 % Outcome: tolerated well, no immediate complications Procedure, treatment alternatives, risks and benefits explained, specific risks discussed. Consent was given by the patient. Immediately  prior to procedure a time out was called to verify the correct patient, procedure, equipment, support staff and site/side marked as required. Patient was prepped and draped in the usual sterile fashion.       Clinical Data: No additional findings.  Objective: Vital Signs: LMP 10/18/2013   Physical Exam:   Constitutional: Patient appears well-developed HEENT:  Head: Normocephalic Eyes:EOM are normal Neck: Normal range of motion Cardiovascular: Normal rate Pulmonary/chest: Effort normal Neurologic: Patient is alert Skin: Skin is warm Psychiatric: Patient has normal mood and affect    Ortho Exam: Orthopedic exam demonstrates increased body mass index.  Pedal pulses palpable.  No real groin pain with internal/external rotation of either leg.  Left knee demonstrates no effusion and pretty reasonable range of motion and extensor mechanism function.  No other masses lymphadenopathy or skin changes noted in that knee region.  No difference definite focal joint line tenderness is present.  Specialty Comments:  No specialty comments available.  Imaging: No results found.   PMFS History: Patient Active Problem List   Diagnosis Date Noted  . Medication management 03/22/2017  . Vitamin D deficiency 03/22/2017  . Abnormal glucose 03/22/2017  . Osteoarthritis 03/22/2017  . 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 Arthritis of shoulder region, right, degenerative 06/04/2014  . Arthritis of hip 03/19/2014  . Hypertension   . Hyperlipidemia   . GERD (  gastroesophageal reflux disease)   . Hiatal hernia   . Morbid obesity ([place])    Past Medical History:  Diagnosis Date  . Arthritis   . Asthma   . Environmental allergies   . GERD (gastroesophageal reflux disease)   . Headache   . Hiatal hernia    with stricture dilation  . Hyperlipidemia   . Hypertension   . Kidney stones 2008  . Obesity, unspecified   . PONV (postoperative nausea and vomiting)   . UTI  (urinary tract infection)    RECENTLY TX WITH ANTIBIOTIC BY PCP    Family History  Problem Relation Age of Onset  . Pulmonary embolism Brother   . Heart disease Brother   . Throat cancer Father   . Cancer Father        throat  . Stroke Mother   . Hypertension Mother   . Hyperlipidemia Mother   . Heart disease Mother   . Colon cancer Neg Hx     Past Surgical History:  Procedure Laterality Date  . CHOLECYSTECTOMY  1987  . ESOPHAGEAL DILATION    . KNEE ARTHROSCOPY Right 2007  . REVERSE SHOULDER ARTHROPLASTY Right 06/04/2014   Procedure: REVERSE SHOULDER ARTHROPLASTY;  Surgeon: Meredith Pel, MD;  Location: [facility];  Service: Orthopedics;  Laterality: Right;  . TOTAL HIP ARTHROPLASTY Right 03/19/2014   Procedure: TOTAL HIP ARTHROPLASTY ANTERIOR APPROACH;  Surgeon: Meredith Pel, MD;  Location: [facility];  Service: Orthopedics;  Laterality: Right;   Social History   Occupational History  . Not on file  Tobacco Use  . Smoking status: Never Smoker  . Smokeless tobacco: Never Used  Substance and Sexual Activity  . Alcohol use: No    Alcohol/week: 0.0 oz  . Drug use: No  . Sexual activity: Never

## 2017-04-27 NOTE — Progress Notes (Signed)
Assessment and Plan:  Bacteria in urine -     Urinalysis, Routine w reflex microscopic -     Urine Culture  Essential hypertension -     COMPLETE METABOLIC PANEL WITH GFR  Screening for cervical cancer -     Cytology - PAP  Morbid obesity - follow up 3 months for progress monitoring - increase veggies, decrease carbs - long discussion about weight loss, diet, and exercise   Future Appointments  Date Time Provider [place]  07/29/2017  9:45 AM [name], MD [facility] None  03/30/2018 10:00 AM Vicie Mutters, PA-C [facility] None     HPI 62 y.o.female presents for follow up.  She had an abnormal urine last visit, treat with , here for recheck.  Her liver function was elevated and her vitamin D were low, we will recheck her LFTs, she is on vitamin D She has not had a PAP since 2013 and we will get one today.   Blood pressure 122/80, pulse 94, temperature 97.6 F (36.4 C), resp. rate 16, height 5\' 3"  (1.6 m), weight 275 lb 3.2 oz (124.8 kg), last menstrual period 10/18/2013, SpO2 97 %.  BMI is Body mass index is 48.75 kg/m., she is working on diet and exercise. Wt Readings from Last 3 Encounters:  04/28/17 275 lb 3.2 oz (124.8 kg)  03/22/17 275 lb 9.6 oz (125 kg)  12/19/14 255 lb (115.7 kg)     Past Medical History:  Diagnosis Date  . Arthritis   . Asthma   . Environmental allergies   . GERD (gastroesophageal reflux disease)   . Headache   . Hiatal hernia    with stricture dilation  . Hyperlipidemia   . Hypertension   . Kidney stones 2008  . Obesity, unspecified   . PONV (postoperative nausea and vomiting)   . UTI (urinary tract infection)    RECENTLY TX WITH ANTIBIOTIC BY PCP     Allergies  Allergen Reactions  . Keflex [Cephalexin] Anaphylaxis and Swelling  . Codeine     REACTION: nausea/vomiting    Current Outpatient Medications on File Prior to Visit  Medication Sig  . cholecalciferol (VITAMIN D) 1000 UNITS tablet Take 1,000  Units by mouth daily.  . cyclobenzaprine (FLEXERIL) 10 MG tablet Take 1 tablet (10 mg total) by mouth 3 (three) times daily as needed for muscle spasms.  . diphenhydramine-acetaminophen (TYLENOL PM) 25-500 MG TABS Take 2 tablets by mouth at bedtime as needed.  . Lactobacillus Casei-Folic Acid 63-8.75 MG CAPS 1 pill daily  . magnesium gluconate (MAGONATE) 500 MG tablet Take 500 mg by mouth 2 (two) times daily.  . meloxicam (MOBIC) 15 MG tablet Take 1 tablet (15 mg total) by mouth daily.  Marland Kitchen oxybutynin (DITROPAN) 5 MG tablet Take 1 tablet (5 mg total) by mouth 3 (three) times daily.  . valsartan-hydrochlorothiazide (DIOVAN-HCT) 320-12.5 MG tablet Take 1 tablet by mouth daily.  . lansoprazole (PREVACID) 30 MG capsule Take 1 capsule (30 mg total) by mouth 2 (two) times daily before a meal.   No current facility-administered medications on file prior to visit.     ROS: all negative except above.   Physical Exam: Filed Weights   04/28/17 0923  Weight: 275 lb 3.2 oz (124.8 kg)   BP 122/80   Pulse 94   Temp 97.6 F (36.4 C)   Resp 16   Ht 5\' 3"  (1.6 m)   Wt 275 lb 3.2 oz (124.8 kg)   LMP 10/18/2013  SpO2 97%   BMI 48.75 kg/m  General Appearance: Well nourished, in no apparent distress. Eyes: PERRLA, EOMs, conjunctiva no swelling or erythema Sinuses: No Frontal/maxillary tenderness ENT/Mouth: Ext aud canals clear, TMs without erythema, bulging. No erythema, swelling, or exudate on post pharynx.  Tonsils not swollen or erythematous. Hearing normal.  Neck: Supple, thyroid normal.  Respiratory: Respiratory effort normal, BS equal bilaterally without rales, rhonchi, wheezing or stridor.  Cardio: RRR with no MRGs. Brisk peripheral pulses without edema.  Abdomen: Soft, + BS.  Non tender, no guarding, rebound, hernias, masses. Lymphatics: Non tender without lymphadenopathy.  GYN: VULVA: normal appearing vulva with no masses, tenderness or lesions, some erythema, no discharge, exam limited by  morbidly obese body habits, cervix not visualized. Musculoskeletal: Full ROM, 5/5 strength, normal gait.  Skin: Warm, dry without rashes, lesions, ecchymosis.  Neuro: Cranial nerves intact. Normal muscle tone, no cerebellar symptoms. Sensation intact.  Psych: Awake and oriented X 3, normal affect, Insight and Judgment appropriate.     Vicie Mutters, PA-C 9:31 AM [facility]

## 2017-04-28 ENCOUNTER — Encounter: Payer: Self-pay | Admitting: Physician Assistant

## 2017-04-28 ENCOUNTER — Other Ambulatory Visit (HOSPITAL_COMMUNITY)
Admission: RE | Admit: 2017-04-28 | Discharge: 2017-04-28 | Disposition: A | Discharge status: Home / Self Care | Payer: Medicare HMO | Source: Ambulatory Visit | Attending: Physician Assistant | Admitting: Physician Assistant

## 2017-04-28 ENCOUNTER — Ambulatory Visit (INDEPENDENT_AMBULATORY_CARE_PROVIDER_SITE_OTHER): Payer: Medicare HMO | Admitting: Physician Assistant

## 2017-04-28 VITALS — BP 122/80 | HR 94 | Temp 97.6°F | Resp 16 | Ht 63.0 in | Wt 275.2 lb

## 2017-04-28 DIAGNOSIS — E785 Hyperlipidemia, unspecified: Secondary | ICD-10-CM | POA: Insufficient documentation

## 2017-04-28 DIAGNOSIS — I10 Essential (primary) hypertension: Secondary | ICD-10-CM | POA: Diagnosis not present

## 2017-04-28 DIAGNOSIS — Z6841 Body Mass Index (BMI) 40.0 and over, adult: Secondary | ICD-10-CM | POA: Diagnosis not present

## 2017-04-28 DIAGNOSIS — Z124 Encounter for screening for malignant neoplasm of cervix: Secondary | ICD-10-CM | POA: Diagnosis not present

## 2017-04-28 DIAGNOSIS — K219 Gastro-esophageal reflux disease without esophagitis: Secondary | ICD-10-CM | POA: Insufficient documentation

## 2017-04-28 DIAGNOSIS — R8271 Bacteriuria: Secondary | ICD-10-CM | POA: Insufficient documentation

## 2017-04-28 DIAGNOSIS — J45909 Unspecified asthma, uncomplicated: Secondary | ICD-10-CM | POA: Insufficient documentation

## 2017-04-28 NOTE — Patient Instructions (Signed)
Check out  Mini habits for weight loss book  2 apps for tracking food is myfitness pal  loseit OR can take picture of your food  Are you an emotional eater? Do you eat more when you're feeling stressed? Do you eat when you're not hungry or when you're full? Do you eat to feel better (to calm and soothe yourself when you're sad, mad, bored, anxious, etc.)? Do you reward yourself with food? Do you regularly eat until you've stuffed yourself? Does food make you feel safe? Do you feel like food is a friend? Do you feel powerless or out of control around food?  If you answered yes to some of these questions than it is likely that you are an emotional eater. This is normally a learned behavior and can take time to first recognize the signs and second BREAK THE HABIT. But here is more information and tips to help.   The difference between emotional hunger and physical hunger Emotional hunger can be powerful, so it's easy to mistake it for physical hunger. But there are clues you can look for to help you tell physical and emotional hunger apart.  Emotional hunger comes on suddenly. It hits you in an instant and feels overwhelming and urgent. Physical hunger, on the other hand, comes on more gradually. The urge to eat doesn't feel as dire or demand instant satisfaction (unless you haven't eaten for a very long time).  Emotional hunger craves specific comfort foods. When you're physically hungry, almost anything sounds good-including healthy stuff like vegetables. But emotional hunger craves junk food or sugary snacks that provide an instant rush. You feel like you need cheesecake or pizza, and nothing else will do.  Emotional hunger often leads to mindless eating. Before you know it, you've eaten a whole bag of chips or an entire pint of ice cream without really paying attention or fully enjoying it. When you're eating in response to physical hunger, you're typically more aware of what you're  doing.  Emotional hunger isn't satisfied once you're full. You keep wanting more and more, often eating until you're uncomfortably stuffed. Physical hunger, on the other hand, doesn't need to be stuffed. You feel satisfied when your stomach is full.  Emotional hunger isn't located in the stomach. Rather than a growling belly or a pang in your stomach, you feel your hunger as a craving you can't get out of your head. You're focused on specific textures, tastes, and smells.  Emotional hunger often leads to regret, guilt, or shame. When you eat to satisfy physical hunger, you're unlikely to feel guilty or ashamed because you're simply giving your body what it needs. If you feel guilty after you eat, it's likely because you know deep down that you're not eating for nutritional reasons.  Identify your emotional eating triggers What situations, places, or feelings make you reach for the comfort of food? Most emotional eating is linked to unpleasant feelings, but it can also be triggered by positive emotions, such as rewarding yourself for achieving a goal or celebrating a holiday or happy event. Common causes of emotional eating include:  Stuffing emotions - Eating can be a way to temporarily silence or "stuff down" uncomfortable emotions, including anger, fear, sadness, anxiety, loneliness, resentment, and shame. While you're numbing yourself with food, you can avoid the difficult emotions you'd rather not feel.  Boredom or feelings of emptiness - Do you ever eat simply to give yourself something to do, to relieve boredom, or as a  way to fill a void in your life? You feel unfulfilled and empty, and food is a way to occupy your mouth and your time. In the moment, it fills you up and distracts you from underlying feelings of purposelessness and dissatisfaction with your life.  Childhood habits - Think back to your childhood memories of food. Did your parents reward good behavior with ice cream, take you out  for pizza when you got a good report card, or serve you sweets when you were feeling sad? These habits can often carry over into adulthood. Or your eating may be driven by nostalgia-for cherished memories of grilling burgers in the backyard with your dad or baking and eating cookies with your mom.  Social influences - Getting together with other people for a meal is a great way to relieve stress, but it can also lead to overeating. It's easy to overindulge simply because the food is there or because everyone else is eating. You may also overeat in social situations out of nervousness. Or perhaps your family or circle of friends encourages you to overeat, and it's easier to go along with the group.  Stress - Ever notice how stress makes you hungry? It's not just in your mind. When stress is chronic, as it so often is in our chaotic, fast-paced world, your body produces high levels of the stress hormone, cortisol. Cortisol triggers cravings for salty, sweet, and fried foods-foods that give you a burst of energy and pleasure. The more uncontrolled stress in your life, the more likely you are to turn to food for emotional relief.  Find other ways to feed your feelings If you don't know how to manage your emotions in a way that doesn't involve food, you won't be able to control your eating habits for very long. Diets so often fail because they offer logical nutritional advice which only works if you have conscious control over your eating habits. It doesn't work when emotions hijack the process, demanding an immediate payoff with food.  In order to stop emotional eating, you have to find other ways to fulfill yourself emotionally. It's not enough to understand the cycle of emotional eating or even to understand your triggers, although that's a huge first step. You need alternatives to food that you can turn to for emotional fulfillment.  Alternatives to emotional eating If you're depressed or lonely, call  someone who always makes you feel better, play with your dog or cat, or look at a favorite photo or cherished memento.  If you're anxious, expend your nervous energy by dancing to your favorite song, squeezing a stress ball, or taking a brisk walk.  If you're exhausted, treat yourself with a hot cup of tea, take a bath, light some scented candles, or wrap yourself in a warm blanket.  If you're bored, read a good book, watch a comedy show, explore the outdoors, or turn to an activity you enjoy (woodworking, playing the guitar, shooting hoops, scrapbooking, etc.).  What is mindful eating? Mindful eating is a practice that develops your awareness of eating habits and allows you to pause between your triggers and your actions. Most emotional eaters feel powerless over their food cravings. When the urge to eat hits, you feel an almost unbearable tension that demands to be fed, right now. Because you've tried to resist in the past and failed, you believe that your willpower just isn't up to snuff. But the truth is that you have more power over your cravings than you  think.  Take 5 before you give in to a craving Emotional eating tends to be automatic and virtually mindless. Before you even realize what you're doing, you've reached for a tub of ice cream and polished off half of it. But if you can take a moment to pause and reflect when you're hit with a craving, you give yourself the opportunity to make a different decision.  Can you put off eating for five minutes? Or just start with one minute. Don't tell yourself you can't give in to the craving; remember, the forbidden is extremely tempting. Just tell yourself to wait.  While you're waiting, check in with yourself. How are you feeling? What's going on emotionally? Even if you end up eating, you'll have a better understanding of why you did it. This can help you set yourself up for a different response next time.  How to practice mindful  eating Eating while you're also doing other things-such as watching TV, driving, or playing with your phone-can prevent you from fully enjoying your food. Since your mind is elsewhere, you may not feel satisfied or continue eating even though you're no longer hungry. Eating more mindfully can help focus your mind on your food and the pleasure of a meal and curb overeating.   Eat your meals in a calm place with no distractions, aside from any dining companions.  Try eating with your non-dominant hand or using chopsticks instead of a knife and fork. Eating in such a non-familiar way can slow down how fast you eat and ensure your mind stays focused on your food.  Allow yourself enough time not to have to rush your meal. Set a timer for 20 minutes and pace yourself so you spend at least that much time eating.  Take small bites and chew them well, taking time to notice the different flavors and textures of each mouthful.  Put your utensils down between bites. Take time to consider how you feel-hungry, satiated-before picking up your utensils again.  Try to stop eating before you are full.It takes time for the signal to reach your brain that you've had enough. Don't feel obligated to always clean your plate.  When you've finished your food, take a few moments to assess if you're really still hungry before opting for an extra serving or dessert.  Learn to accept your feelings-even the bad ones  While it may seem that the core problem is that you're powerless over food, emotional eating actually stems from feeling powerless over your emotions. You don't feel capable of dealing with your feelings head on, so you avoid them with food.  Recommended reading  Mini Habits for weight loss  Healthy Eating: A guide to the new nutrition - [place] Report  10 Tips for Mindful Eating - How mindfulness can help you fully enjoy a meal and the experience of eating-with moderation  and restraint. ([place])  Weight Loss: Gain Control of Emotional Eating - Tips to regain control of your eating habits. [facility])  Why Stress Causes People to Overeat -Tips on controlling stress eating. ([place])  Mindful Eating Meditations -Free online mindfulness meditations. ([employer])    8 Critical Weight-Loss Tips That Aren't Diet and Exercise  1. STARVE THE DISTRACTIONS  All too often when we eat, we're also multitasking: watching TV, answering emails, scrolling through social media. These habits are detrimental to having a strong, clear, healthy relationship with food, and they can  hinder our ability to make dietary changes.  In order to truly focus on what you're eating, how much you're eating, why you're eating those specific foods and, most importantly, how those foods make you feel, you need to starve the distractions. That means when you eat, just eat. Focus on your food, the process it went through to end up on your plate, where it came from and how it nourishes you. With this technique, you're more likely to finish a meal feeling satiated.  2.  CONSIDER WHAT YOU'RE NOT WILLING TO DO  This might sound counterintuitive, but it can help provide a "why" when motivation is waning. Declare, in writing, what you are unwilling to do, for example "I am unwilling to be the old dad who cannot play sports with my children".  So consider what you're not willing to accept, write it down, and keep it at the ready.  3.  STOP LABELING FOOD "GOOD" AND "BAD"  You've probably heard someone say they ate something "bad." Maybe you've even said it yourself.  The trouble with 'bad' foods isn't that they'll send you to the grave after a bite or two. The trouble comes when we eat excessive portions of really calorie-dense foods meal after meal, day after day.  Instead of labeling foods as good or bad, think about which foods you can eat a lot  of, and which ones you should just eat a little of. Then, plan ways to eat the foods you really like in portions that fit with your overall goals. A good example of this would be having a slice of pizza alongside a club salad with chicken breast, avocado and a bit of dressing. This is vastly different than 3 slices of pizza, 4 breadsticks with cheese sauce and half of a liter of regular soda.  4.  BRUSH YOUR TEETH AFTER YOU EAT  Getting your mindset in order is important, but sometimes small habits can make a big difference. After eating, you still have the taste of food in their mouth, which often causes people to eat more even if they are full or engage in a nibble or two of dessert.  Brushing your teeth will remove the taste of food from your mouth, and the clean, minty freshness will serve as a cue that mealtime is over.  5.  FOCUS ON CROWDING NOT CUTTING  The most common first step during 'dieting' is to cut. We cut our portion sizes down, we cut out 'bad' foods, we cut out entire food groups. This act of cutting puts [place] and our minds into scarcity mode.  When something is off-limits, even if you're able to avoid it for a while, you could end up bingeing on it later because you've gone so long without it. So, instead of cutting, focus on crowding. If you crowd your plate and fill it up with more foods like veggies and protein, it simply allows less room for the other stuff. In other words, shift your focus away from what you can't eat, and celebrate the foods that will help you reach your goals.  6.  TAKE TRACKING A STEP FURTHER  Track what you eat, when you ate it, how much you ate and how that food made you feel. Being completely honest with yourself and writing down every single thing that passes through your lips will help you start to notice that maybe you actually do snack, possibly take in more sugar than you thought, eat when you're bored rather  than just hungry or maybe that you have a  habit of snacking before bed while watching TV.  The difference from simply tracking your food intake is you're taking into account how food makes you feel, as well as what you're doing while you're eating. This is about becoming more mindful of what, when and why you eat.  7.  PRIORITIZE GOOD SLEEP  One of the strongest risk factors for being overweight is poor sleep. When you're feeling tired, you're more likely to choose unhealthy comfort foods and to skip your workout. Additionally, sleep deprivation may slow down your metabolism. [employer]! Therefore, sleeping 7-8 hours per night can help with weight loss without having to change your diet or increase your physical activity. And if you feel you snore and still wake up tired, talk with me about sleep apnea.  8.  SET ASIDE TIME TO DISCONNECT  Just get out there. Disconnect from the electronics and connect to the elements. Not only will this help reduce stress (a major factor in weight gain) by giving your mind a break from the constant stimulation we've all become so accustomed to, but it may also reprogram your brain to connect with yourself and what you're feeling.

## 2017-04-29 ENCOUNTER — Encounter: Payer: Self-pay | Admitting: Physician Assistant

## 2017-05-02 ENCOUNTER — Encounter: Payer: Self-pay | Admitting: Physician Assistant

## 2017-05-02 LAB — COMPLETE METABOLIC PANEL WITH GFR
AG Ratio: 1.3 (calc) (ref 1.0–2.5)
ALKALINE PHOSPHATASE (APISO): 71 U/L (ref 33–130)
ALT: 54 U/L — ABNORMAL HIGH (ref 6–29)
AST: 46 U/L — AB (ref 10–35)
Albumin: 4.1 g/dL (ref 3.6–5.1)
BILIRUBIN TOTAL: 0.6 mg/dL (ref 0.2–1.2)
BUN: 16 mg/dL (ref 7–25)
CO2: 25 mmol/L (ref 20–32)
Calcium: 9.7 mg/dL (ref 8.6–10.4)
Chloride: 105 mmol/L (ref 98–110)
Creat: 0.87 mg/dL (ref 0.50–0.99)
GFR, Est African American: 84 mL/min/{1.73_m2} (ref >=60)
GFR, Est Non African American: 72 mL/min/{1.73_m2} (ref >=60)
Globulin: 3.1 g/dL (calc) (ref 1.9–3.7)
Glucose, Bld: 96 mg/dL (ref 65–99)
Potassium: 4.2 mmol/L (ref 3.5–5.3)
SODIUM: 140 mmol/L (ref 135–146)
Total Protein: 7.2 g/dL (ref 6.1–8.1)

## 2017-05-02 LAB — URINALYSIS, ROUTINE W REFLEX MICROSCOPIC
Bilirubin Urine: NEGATIVE
GLUCOSE, UA: NEGATIVE
Hyaline Cast: NONE SEEN /LPF
Ketones, ur: NEGATIVE
NITRITE: POSITIVE — AB
Protein, ur: NEGATIVE
Specific Gravity, Urine: 1.008 (ref 1.001–1.03)
Squamous Epithelial / LPF: NONE SEEN /HPF (ref <=5)
WBC, UA: >=60 /HPF — AB (ref 0–5)
pH: 6 (ref 5.0–8.0)

## 2017-05-02 LAB — URINE CULTURE
MICRO NUMBER: 90508255
SPECIMEN QUALITY:: ADEQUATE

## 2017-05-03 ENCOUNTER — Encounter: Payer: Self-pay | Admitting: Physician Assistant

## 2017-05-03 ENCOUNTER — Encounter (INDEPENDENT_AMBULATORY_CARE_PROVIDER_SITE_OTHER): Payer: Self-pay | Admitting: Orthopedic Surgery

## 2017-05-03 LAB — CYTOLOGY - PAP
Diagnosis: NEGATIVE
HPV: NOT DETECTED

## 2017-05-04 NOTE — Telephone Encounter (Signed)
Please call thanksBecause he is the one in our practice who does neck surgery.Could try to see Dr. YatesChiropractic treatment in an 62 year old could be tricky.

## 2017-06-23 ENCOUNTER — Other Ambulatory Visit: Payer: Self-pay | Admitting: Physician Assistant

## 2017-06-23 DIAGNOSIS — N3946 Mixed incontinence: Secondary | ICD-10-CM

## 2017-07-11 ENCOUNTER — Ambulatory Visit: Payer: Self-pay | Admitting: Internal Medicine

## 2017-07-29 ENCOUNTER — Ambulatory Visit: Payer: Self-pay | Admitting: Internal Medicine

## 2017-08-23 ENCOUNTER — Ambulatory Visit (INDEPENDENT_AMBULATORY_CARE_PROVIDER_SITE_OTHER): Payer: Medicare HMO | Admitting: Internal Medicine

## 2017-08-23 VITALS — BP 128/76 | HR 72 | Temp 97.9°F | Resp 18 | Ht 63.0 in | Wt 285.0 lb

## 2017-08-23 DIAGNOSIS — Z79899 Other long term (current) drug therapy: Secondary | ICD-10-CM | POA: Diagnosis not present

## 2017-08-23 DIAGNOSIS — R632 Polyphagia: Secondary | ICD-10-CM | POA: Diagnosis not present

## 2017-08-23 DIAGNOSIS — R7309 Other abnormal glucose: Secondary | ICD-10-CM

## 2017-08-23 DIAGNOSIS — I10 Essential (primary) hypertension: Secondary | ICD-10-CM

## 2017-08-23 DIAGNOSIS — Z6841 Body Mass Index (BMI) 40.0 and over, adult: Secondary | ICD-10-CM

## 2017-08-23 DIAGNOSIS — E559 Vitamin D deficiency, unspecified: Secondary | ICD-10-CM | POA: Diagnosis not present

## 2017-08-23 DIAGNOSIS — R7303 Prediabetes: Secondary | ICD-10-CM

## 2017-08-23 DIAGNOSIS — K219 Gastro-esophageal reflux disease without esophagitis: Secondary | ICD-10-CM

## 2017-08-23 DIAGNOSIS — E782 Mixed hyperlipidemia: Secondary | ICD-10-CM | POA: Diagnosis not present

## 2017-08-23 NOTE — Progress Notes (Signed)
This very nice 62 y.o. single WF presents for 3 month follow up with HTN, HLD, Pre-Diabetes and Vitamin D Deficiency. Her GERD is controlled with her meds     Patient is treated for HTN circa 2000 & BP has been controlled at home. Today's BP is at goal - 128/76. Patient has had no complaints of any cardiac type chest pain, palpitations, dyspnea / orthopnea / PND, dizziness, claudication, or dependent edema.     Hyperlipidemia is not controlled with diet & meds. Patient denies myalgias or other med SE's. Last Lipids were not at goal:  Lab Results  Component Value Date   CHOL 196 03/22/2017   HDL 42 (L) 03/22/2017   LDLCALC 128 (H) 03/22/2017   TRIG 150 (H) 03/22/2017   CHOLHDL 4.7 03/22/2017      Also, the patient has history of Morbid Obesity (BMI 50+) consequent of her Gluttony and also has PreDiabetes  (A1c 5.7%/2015 & 5.9%/2016)  and has had no symptoms of reactive hypoglycemia, diabetic polys, paresthesias or visual blurring.  Last A1c was Normal & at goal: Lab Results  Component Value Date   HGBA1C 5.5 03/22/2017      Further, the patient also has history of Vitamin D Deficiency ("11"/2008) and supplements vitamin D sub-optimally. Last vitamin D was still very low:   Lab Results  Component Value Date   VD25OH 28 (L) 03/22/2017   Current Outpatient Medications on File Prior to Visit  Medication Sig  . cholecalciferol (VITAMIN D) 1000 UNITS tablet Take 1,000 Units by mouth daily.  . cyclobenzaprine (FLEXERIL) 10 MG tablet Take 1 tablet (10 mg total) by mouth 3 (three) times daily as needed for muscle spasms.  . diphenhydramine-acetaminophen (TYLENOL PM) 25-500 MG TABS Take 2 tablets by mouth at bedtime as needed.  . Lactobacillus Casei-Folic Acid 93-8.10 MG CAPS 1 pill daily  . meloxicam (MOBIC) 15 MG tablet Take 1 tablet (15 mg total) by mouth daily.  Marland Kitchen oxybutynin (DITROPAN) 5 MG tablet TAKE 1 TABLET 3 TIMES A DAY  . valsartan-hydrochlorothiazide (DIOVAN-HCT) 320-12.5 MG  tablet Take 1 tablet by mouth daily.  . lansoprazole (PREVACID) 30 MG capsule Take 1 capsule (30 mg total) by mouth 2 (two) times daily before a meal.  . magnesium gluconate (MAGONATE) 500 MG tablet Take 500 mg by mouth 2 (two) times daily.   No current facility-administered medications on file prior to visit.    Allergies  Allergen Reactions  . Keflex [Cephalexin] Anaphylaxis and Swelling  . Codeine     REACTION: nausea/vomiting   PMHx:   Past Medical History:  Diagnosis Date  . Arthritis   . Asthma   . Environmental allergies   . GERD (gastroesophageal reflux disease)   . Headache   . Hiatal hernia    with stricture dilation  . Hyperlipidemia   . Hypertension   . Kidney stones 2008  . Obesity, unspecified   . PONV (postoperative nausea and vomiting)   . UTI (urinary tract infection)    RECENTLY TX WITH ANTIBIOTIC BY PCP   Immunization History  Administered Date(s) Administered  . Influenza-Unspecified 11/22/2014, 10/31/2015  . PPD Test 06/01/2013  . Tdap 03/22/2017   Past Surgical History:  Procedure Laterality Date  . CHOLECYSTECTOMY  1987  . ESOPHAGEAL DILATION    . KNEE ARTHROSCOPY Right 2007  . REVERSE SHOULDER ARTHROPLASTY Right 06/04/2014   Procedure: REVERSE SHOULDER ARTHROPLASTY;  Surgeon: Meredith Pel, MD;  Location: [facility];  Service: Orthopedics;  Laterality: Right;  . TOTAL HIP ARTHROPLASTY Right 03/19/2014   Procedure: TOTAL HIP ARTHROPLASTY ANTERIOR APPROACH;  Surgeon: Meredith Pel, MD;  Location: [facility];  Service: Orthopedics;  Laterality: Right;   FHx:    Reviewed / unchanged  SHx:    Reviewed / unchanged   Systems Review:  Constitutional: Denies fever, chills, wt changes, headaches, insomnia, fatigue, night sweats, change in appetite. Eyes: Denies redness, blurred vision, diplopia, discharge, itchy, watery eyes.  ENT: Denies discharge, congestion, post nasal drip, epistaxis, sore throat, earache, hearing loss, dental pain, tinnitus,  vertigo, sinus pain, snoring.  CV: Denies chest pain, palpitations, irregular heartbeat, syncope, dyspnea, diaphoresis, orthopnea, PND, claudication or edema. Respiratory: denies cough, dyspnea, DOE, pleurisy, hoarseness, laryngitis, wheezing.  Gastrointestinal: Denies dysphagia, odynophagia, heartburn, reflux, water brash, abdominal pain or cramps, nausea, vomiting, bloating, diarrhea, constipation, hematemesis, melena, hematochezia  or hemorrhoids. Genitourinary: Denies dysuria, frequency, urgency, nocturia, hesitancy, discharge, hematuria or flank pain. Musculoskeletal: Denies arthralgias, myalgias, stiffness, jt. swelling, pain, limping or strain/sprain.  Skin: Denies pruritus, rash, hives, warts, acne, eczema or change in skin lesion(s). Neuro: No weakness, tremor, incoordination, spasms, paresthesia or pain. Psychiatric: Denies confusion, memory loss or sensory loss. Endo: Denies change in weight, skin or hair change.  Heme/Lymph: No excessive bleeding, bruising or enlarged lymph nodes.  Physical Exam  BP 128/76   Pulse 72   Temp 97.9 F (36.6 C)   Resp 18   Ht 5\' 3"  (1.6 m)   Wt 285 lb (129.3 kg)   LMP 10/18/2013   BMI 50.49 kg/m   Appears  Over nourished, well groomed  and in no distress.  Eyes: PERRLA, EOMs, conjunctiva no swelling or erythema. Sinuses: No frontal/maxillary tenderness ENT/Mouth: EAC's clear, TM's nl w/o erythema, bulging. Nares clear w/o erythema, swelling, exudates. Oropharynx clear without erythema or exudates. Oral hygiene is good. Tongue normal, non obstructing. Hearing intact.  Neck: Supple. Thyroid not palpable. Car 2+/2+ without bruits, nodes or JVD. Chest: Respirations nl with BS clear & equal w/o rales, rhonchi, wheezing or stridor.  Cor: Heart sounds normal w/ regular rate and rhythm without sig. murmurs, gallops, clicks or rubs. Peripheral pulses normal and equal  without edema.  Abdomen: Soft & bowel sounds normal. Non-tender w/o guarding,  rebound, hernias, masses or organomegaly.  Lymphatics: Unremarkable.  Musculoskeletal: Full ROM all peripheral extremities, joint stability, 5/5 strength and normal gait.  Skin: Warm, dry without exposed rashes, lesions or ecchymosis apparent.  Neuro: Cranial nerves intact, reflexes equal bilaterally. Sensory-motor testing grossly intact. Tendon reflexes grossly intact.  Pysch: Alert & oriented x 3.  Insight and judgement nl & appropriate. No ideations.  Assessment and Plan:  1. Essential hypertension  - Continue medication, monitor blood pressure at home.  - Continue DASH diet.  Reminder to go to the ER if any CP,  SOB, nausea, dizziness, severe HA, changes vision/speech.  - CBC with Differential/Platelet - COMPLETE METABOLIC PANEL WITH GFR - Magnesium - TSH  2. Hyperlipidemia, mixed  - Continue diet/meds, exercise,& lifestyle modifications.  - Continue monitor periodic cholesterol/liver & renal functions   - Lipid panel - TSH  3. Abnormal glucose  - Continue diet, exercise, lifestyle modifications.  - Monitor appropriate labs.  - Hemoglobin A1c - Insulin, random  4. Vitamin D deficiency  - Continue supplementation.  - VITAMIN D 25 Hydroxyl  5. Prediabetes  - Hemoglobin A1c - Insulin, random  6. Gastroesophageal reflux disease  - CBC with Differential/Platelet  7. Class 3 severe obesity due to excess  calories with serious comorbidity and body mass index (BMI) of 50.0 to 59.9 in adult [facility])  - Lipid panel - Hemoglobin A1c  8. Medication management  - CBC with Differential/Platelet - COMPLETE METABOLIC PANEL WITH GFR - Magnesium - Lipid panel - TSH - Hemoglobin A1c - Insulin, random - VITAMIN D 25 Hydroxyl        Discussed  regular exercise, BP monitoring, weight control to achieve/maintain BMI less than 25 and discussed med and SE's. Recommended labs to assess and monitor clinical status with further disposition pending results of labs. Over 30  minutes of exam, counseling, chart review was performed.

## 2017-08-23 NOTE — Patient Instructions (Signed)

## 2017-08-24 LAB — LIPID PANEL
CHOLESTEROL: 208 mg/dL — AB (ref <200)
HDL: 46 mg/dL — ABNORMAL LOW (ref >50)
LDL Cholesterol (Calc): 130 mg/dL (calc) — ABNORMAL HIGH
Non-HDL Cholesterol (Calc): 162 mg/dL (calc) — ABNORMAL HIGH (ref <130)
Total CHOL/HDL Ratio: 4.5 (calc) (ref <5.0)
Triglycerides: 180 mg/dL — ABNORMAL HIGH (ref <150)

## 2017-08-24 LAB — COMPLETE METABOLIC PANEL WITH GFR
AG Ratio: 1.4 (calc) (ref 1.0–2.5)
ALKALINE PHOSPHATASE (APISO): 59 U/L (ref 33–130)
ALT: 47 U/L — ABNORMAL HIGH (ref 6–29)
AST: 35 U/L (ref 10–35)
Albumin: 4 g/dL (ref 3.6–5.1)
BILIRUBIN TOTAL: 0.5 mg/dL (ref 0.2–1.2)
BUN/Creatinine Ratio: 24 (calc) — ABNORMAL HIGH (ref 6–22)
BUN: 26 mg/dL — ABNORMAL HIGH (ref 7–25)
CHLORIDE: 101 mmol/L (ref 98–110)
CO2: 25 mmol/L (ref 20–32)
Calcium: 9.4 mg/dL (ref 8.6–10.4)
Creat: 1.07 mg/dL — ABNORMAL HIGH (ref 0.50–0.99)
GFR, Est African American: 65 mL/min/{1.73_m2} (ref >=60)
GFR, Est Non African American: 56 mL/min/{1.73_m2} — ABNORMAL LOW (ref >=60)
GLOBULIN: 2.8 g/dL (ref 1.9–3.7)
Glucose, Bld: 90 mg/dL (ref 65–99)
Potassium: 4.3 mmol/L (ref 3.5–5.3)
SODIUM: 137 mmol/L (ref 135–146)
Total Protein: 6.8 g/dL (ref 6.1–8.1)

## 2017-08-24 LAB — CBC WITH DIFFERENTIAL/PLATELET
BASOS ABS: 61 {cells}/uL (ref 0–200)
Basophils Relative: 1 %
Eosinophils Absolute: 366 cells/uL (ref 15–500)
Eosinophils Relative: 6 %
HEMATOCRIT: 40.5 % (ref 35.0–45.0)
Hemoglobin: 13.6 g/dL (ref 11.7–15.5)
LYMPHS ABS: 1800 {cells}/uL (ref 850–3900)
MCH: 31.5 pg (ref 27.0–33.0)
MCHC: 33.6 g/dL (ref 32.0–36.0)
MCV: 93.8 fL (ref 80.0–100.0)
MPV: 10.8 fL (ref 7.5–12.5)
Monocytes Relative: 9.9 %
Neutro Abs: 3270 cells/uL (ref 1500–7800)
Neutrophils Relative %: 53.6 %
PLATELETS: 204 10*3/uL (ref 140–400)
RBC: 4.32 10*6/uL (ref 3.80–5.10)
RDW: 12 % (ref 11.0–15.0)
TOTAL LYMPHOCYTE: 29.5 %
WBC: 6.1 10*3/uL (ref 3.8–10.8)
WBCMIX: 604 {cells}/uL (ref 200–950)

## 2017-08-24 LAB — VITAMIN D 25 HYDROXY (VIT D DEFICIENCY, FRACTURES): VIT D 25 HYDROXY: 34 ng/mL (ref 30–100)

## 2017-08-24 LAB — HEMOGLOBIN A1C
HEMOGLOBIN A1C: 5.4 %{Hb} (ref <5.7)
MEAN PLASMA GLUCOSE: 108 (calc)
eAG (mmol/L): 6 (calc)

## 2017-08-24 LAB — TSH: TSH: 2.86 mIU/L (ref 0.40–4.50)

## 2017-08-24 LAB — INSULIN, RANDOM: Insulin: 43.3 u[IU]/mL — ABNORMAL HIGH (ref 2.0–19.6)

## 2017-08-24 LAB — MAGNESIUM: Magnesium: 1.8 mg/dL (ref 1.5–2.5)

## 2017-08-28 ENCOUNTER — Encounter: Payer: Self-pay | Admitting: Internal Medicine

## 2017-08-28 DIAGNOSIS — R632 Polyphagia: Secondary | ICD-10-CM | POA: Insufficient documentation

## 2017-10-06 DIAGNOSIS — R69 Illness, unspecified: Secondary | ICD-10-CM | POA: Diagnosis not present

## 2017-12-08 ENCOUNTER — Ambulatory Visit: Payer: Self-pay | Admitting: Adult Health

## 2018-03-01 ENCOUNTER — Other Ambulatory Visit: Payer: Self-pay | Admitting: Physician Assistant

## 2018-03-01 DIAGNOSIS — Z1231 Encounter for screening mammogram for malignant neoplasm of breast: Secondary | ICD-10-CM

## 2018-03-29 ENCOUNTER — Ambulatory Visit: Payer: Medicare HMO

## 2018-03-30 ENCOUNTER — Encounter: Payer: Self-pay | Admitting: Physician Assistant

## 2018-04-08 ENCOUNTER — Other Ambulatory Visit: Payer: Self-pay | Admitting: Adult Health

## 2018-04-08 DIAGNOSIS — M1990 Unspecified osteoarthritis, unspecified site: Secondary | ICD-10-CM

## 2018-04-08 MED ORDER — MELOXICAM 15 MG PO TABS: 15.0000 mg | ORAL_TABLET | Freq: Every day | ORAL | 1 refills | Status: DC

## 2018-04-28 ENCOUNTER — Ambulatory Visit: Payer: Medicare HMO

## 2018-06-06 NOTE — Progress Notes (Signed)
Patient ID: Christine [name], female   DOB: 04-02-1955, 63 y.o.   MRN: [id number]   MEDICARE  Assessment and Plan:  Hiatal hernia Continue PPI/H2 blocker, diet discussed  Morbid obesity ([place]) - follow up 3 months for progress monitoring - increase veggies, decrease carbs - long discussion about weight loss, diet, and exercise  Essential hypertension -     CBC with Differential/Platelet -     COMPLETE METABOLIC PANEL WITH GFR -     TSH -     EKG 12-Lead -     Urinalysis, Routine w reflex microscopic -     Microalbumin / creatinine urine ratio - continue medications, DASH diet, exercise and monitor at home. Call if greater than 130/80.   Hyperlipidemia, unspecified hyperlipidemia type -     Lipid panel -     Magnesium check lipids decrease fatty foods increase activity.   Medication management  Vitamin D deficiency -     VITAMIN D 25 Hydroxy (Vit-D Deficiency, Fractures)  Abnormal glucose -     Hemoglobin A1c Discussed disease progression and risks Discussed diet/exercise, weight management and risk modification  Mixed stress and urge urinary incontinence Weight loss advised, monitor  Depression, major, recurrent, active (HCC) -     buPROPion (WELLBUTRIN XL) 150 MG 24 hr tablet; Take 1 tablet (150 mg total) by mouth every morning. + depression, will start on wellbutrin, suggest counseling, follow up 3 months  B12 deficiency -     Vitamin B12  Anemia, unspecified type -     Iron,Total/Total Iron Binding Cap -     Ferritin  Left thyroid nodule -     US THYROID; Future Thyroid nodules prominent on left side, will get [place]  Need for prophylactic vaccination against Streptococcus pneumoniae (pneumococcus) -     Pneumococcal polysaccharide vaccine 23-valent greater than or equal to 2yo subcutaneous/IM    Discussed med's effects and SE's. Screening labs and tests as requested with regular follow-up as recommended.  Plan:   During the course of the visit the  patient was educated and counseled about appropriate screening and preventive services including:    Pneumococcal vaccine   Prevnar 13  Influenza vaccine  Td vaccine  Screening electrocardiogram  Bone densitometry screening  Colorectal cancer screening  Diabetes screening  Glaucoma screening  Nutrition counseling   Advanced directives: requested   HPI  63 y.o. female  presents for followup medicare visit.   She has had hip and shoulder replacement with Dr. Marlou Sa, she is doing very well with this.   She states she is depressed, not wanting to get out of bed, brother died 04-05-14. She has never been on anything for depression in the past.   Her blood pressure has been controlled at home, today their BP is BP: 124/68.  She does workout. She denies chest pain, shortness of breath, dizziness.   BMI is Body mass index is 50.24 kg/m., she is working on diet and exercise. Wt Readings from Last 3 Encounters:  06/08/18 283 lb 9.6 oz (128.6 kg)  08/23/17 285 lb (129.3 kg)  04/28/17 275 lb 3.2 oz (124.8 kg)   She is on cholesterol medication and denies myalgias. Her cholesterol is at goal. The cholesterol last visit was:  Lab Results  Component Value Date   CHOL 208 (H) 08/23/2017   HDL 46 (L) 08/23/2017   LDLCALC 130 (H) 08/23/2017   TRIG 180 (H) 08/23/2017   CHOLHDL 4.5 08/23/2017  . She has been working on diet  and exercise for prediabetes, she is not on bASA, she is not on ACE/ARB and denies foot ulcerations, hyperglycemia, hypoglycemia , increased appetite, nausea, paresthesia of the feet, polydipsia, polyuria, visual disturbances, vomiting and weight loss. Last A1C in the office was:  Lab Results  Component Value Date   HGBA1C 5.4 08/23/2017   Patient is on Vitamin D supplement.   Lab Results  Component Value Date   VD25OH 34 08/23/2017      Current Medications:  Current Outpatient Medications on File Prior to Visit  Medication Sig  . cholecalciferol (VITAMIN D)  1000 UNITS tablet Take 1,000 Units by mouth daily.  . cyclobenzaprine (FLEXERIL) 10 MG tablet Take 1 tablet (10 mg total) by mouth 3 (three) times daily as needed for muscle spasms.  . diphenhydramine-acetaminophen (TYLENOL PM) 25-500 MG TABS Take 2 tablets by mouth at bedtime as needed.  . Lactobacillus Casei-Folic Acid 75-1.02 MG CAPS 1 pill daily  . magnesium gluconate (MAGONATE) 500 MG tablet Take 500 mg by mouth 2 (two) times daily.  . meloxicam (MOBIC) 15 MG tablet Take 1 tablet (15 mg total) by mouth daily.  Marland Kitchen oxybutynin (DITROPAN) 5 MG tablet TAKE 1 TABLET 3 TIMES A DAY  . valsartan-hydrochlorothiazide (DIOVAN-HCT) 320-12.5 MG tablet Take 1 tablet by mouth daily.  . lansoprazole (PREVACID) 30 MG capsule Take 1 capsule (30 mg total) by mouth 2 (two) times daily before a meal.   No current facility-administered medications on file prior to visit.     Health Maintenance:   Immunization History  Administered Date(s) Administered  . Influenza Inj Mdck Quad Pf 11/02/2016  . Influenza, Quadrivalent, Recombinant, Inj, Pf 10/06/2017  . Influenza-Unspecified 11/22/2014, 10/31/2015, 10/06/2017  . PPD Test 06/01/2013  . Pneumococcal Polysaccharide-23 06/08/2018  . Tdap 03/22/2017   TDAP 2019 Influenza 2019 Pneumovax DUE Prevnar 13 at 34  Colonoscopy 2016 q 5 years- DUE NEXT YEAR MGM 01/2017- GOING June due to [place] 05/2013 PAP 2019 normal, neg HPV  Patient Care Team: [name], MD as PCP - General (Internal Medicine) Marlou Sa, Tonna Corner, MD as Consulting Physician (Orthopedic Surgery) Sable Feil, MD as Consulting Physician (Gastroenterology) Allyn Kenner, DO as Consulting Physician (Obstetrics and Gynecology) Rutherford Guys, MD as Consulting Physician (Ophthalmology) Rolm Bookbinder, MD as Consulting Physician (Dermatology)  Medical History:  Past Medical History:  Diagnosis Date  . Arthritis   . Asthma   . Environmental allergies   . GERD  (gastroesophageal reflux disease)   . Headache   . Hiatal hernia    with stricture dilation  . Hyperlipidemia   . Hypertension   . Kidney stones 2008  . Obesity, unspecified   . PONV (postoperative nausea and vomiting)   . UTI (urinary tract infection)    RECENTLY TX WITH ANTIBIOTIC BY PCP    Allergies Allergies  Allergen Reactions  . Keflex [Cephalexin] Anaphylaxis and Swelling  . Codeine     REACTION: nausea/vomiting    SURGICAL HISTORY She  has a past surgical history that includes Cholecystectomy (1987); Esophageal dilation; Knee arthroscopy (Right, 2007); Total hip arthroplasty (Right, 03/19/2014); and Reverse shoulder arthroplasty (Right, 06/04/2014). FAMILY HISTORY Her family history includes Cancer in her father; Heart disease in her brother and mother; Hyperlipidemia in her mother; Hypertension in her mother; Pulmonary embolism in her brother; Stroke in her mother; Throat cancer in her father. SOCIAL HISTORY She  reports that she has never smoked. She has never used smokeless tobacco. She reports that she does not drink alcohol or use  drugs.  Review of Systems: Review of Systems  Constitutional: Negative for chills, fever and malaise/fatigue.  HENT: Negative for congestion, ear discharge, ear pain and sore throat.   Eyes: Negative.   Respiratory: Negative for cough, shortness of breath and wheezing.   Cardiovascular: Negative for chest pain, palpitations, claudication and leg swelling.  Gastrointestinal: Negative for blood in stool, constipation, diarrhea, heartburn, melena, nausea and vomiting.  Genitourinary: Negative for urgency.  Musculoskeletal: Negative for joint pain.  Skin: Negative.   Neurological: Negative for dizziness, sensory change, loss of consciousness and headaches.  Psychiatric/Behavioral: Positive for depression. Negative for hallucinations, memory loss, substance abuse and suicidal ideas. The patient is not nervous/anxious and does not have insomnia.     MEDICARE WELLNESS OBJECTIVES: Physical activity: Current Exercise Habits: The patient does not participate in regular exercise at present Cardiac risk factors: Cardiac Risk Factors include: advanced age (>46men, >75 women);dyslipidemia;hypertension;obesity (BMI >30kg/m2);sedentary lifestyle Depression/mood screen:   Depression screen [facility] 2/9 06/08/2018  Decreased Interest 1  Down, Depressed, Hopeless 1  PHQ - 2 Score 2  Altered sleeping 2  Tired, decreased energy 2  Change in appetite 2  Feeling bad or failure about yourself  1  Trouble concentrating 0  Moving slowly or fidgety/restless 0  Suicidal thoughts 0  PHQ-9 Score 9  Difficult doing work/chores Somewhat difficult    ADLs:  In your present state of health, do you have any difficulty performing the following activities: 06/08/2018 08/28/2017  Hearing? N N  Vision? N N  Difficulty concentrating or making decisions? N N  Walking or climbing stairs? Y N  Dressing or bathing? N N  Doing errands, shopping? N N  Some recent data might be hidden     Cognitive Testing  Alert? Yes  Normal Appearance?Yes  Oriented to person? Yes  Place? Yes   Time? Yes  Recall of three objects?  Yes  Can perform simple calculations? Yes  Displays appropriate judgment?Yes  Can read the correct time from a watch face?Yes  EOL planning: Does Patient Have a Medical Advance Directive?: No Would patient like information on creating a medical advance directive?: No - Patient declined   Physical Exam: Estimated body mass index is 50.24 kg/m as calculated from the following:   Height as of this encounter: 5\' 3"  (1.6 m).   Weight as of this encounter: 283 lb 9.6 oz (128.6 kg). BP 124/68   Pulse 85   Temp (!) 97.2 F (36.2 C) (Temporal)   Resp 14   Ht 5\' 3"  (1.6 m)   Wt 283 lb 9.6 oz (128.6 kg)   LMP 10/18/2013   BMI 50.24 kg/m   General Appearance: Well nourished well developed, in no apparent distress.  Eyes: PERRLA, EOMs, conjunctiva no  swelling or erythema ENT/Mouth: Ear canals normal without obstruction, swelling, erythema, or discharge.  TMs normal bilaterally with no erythema, bulging, retraction, or loss of landmark.  Oropharynx moist and clear with no exudate, erythema, or swelling.   Neck: Supple, thyroid with multiple nodules, normal size, dominant nodule on the left. No bruits.  No cervical adenopathy Respiratory: Respiratory effort normal, Breath sounds clear A&P without wheeze, rhonchi, rales.   Cardio: RRR without murmurs, rubs or gallops. Brisk peripheral pulses without edema.  Chest: symmetric, with normal excursions Breasts: Symmetric, without lumps, nipple discharge, retractions.  Abdomen: Morbidly obese,  Soft, nontender, no guarding, rebound, hernias, masses, or organomegaly.  Lymphatics: Non tender without lymphadenopathy.  Genitourinary: defer Musculoskeletal: Limited ROM in upper extremities 4/5 strength,  and normal gait.  Skin: Warm, dry without rashes, lesions, ecchymosis. Neuro: Awake and oriented X 3, Cranial nerves intact, reflexes equal bilaterally. Normal muscle tone, no cerebellar symptoms. Sensation intact.  Psych:  normal affect, Insight and Judgment appropriate.    Medicare Attestation I have personally reviewed: The patient's medical and social history Their use of alcohol, tobacco or illicit drugs Their current medications and supplements The patient's functional ability including ADLs,fall risks, home safety risks, cognitive, and hearing and visual impairment Diet and physical activities Evidence for depression or mood disorders  The patient's weight, height, BMI, and visual acuity have been recorded in the chart.  I have made referrals, counseling, and provided education to the patient based on review of the above and I have provided the patient with a written personalized care plan for preventive services.    Vicie Mutters 11:59 AM [facility]

## 2018-06-07 ENCOUNTER — Ambulatory Visit: Payer: Medicare HMO

## 2018-06-08 ENCOUNTER — Ambulatory Visit (INDEPENDENT_AMBULATORY_CARE_PROVIDER_SITE_OTHER): Payer: Medicare HMO | Admitting: Physician Assistant

## 2018-06-08 ENCOUNTER — Encounter: Payer: Self-pay | Admitting: Physician Assistant

## 2018-06-08 ENCOUNTER — Other Ambulatory Visit: Payer: Self-pay

## 2018-06-08 VITALS — BP 124/68 | HR 85 | Temp 97.2°F | Resp 14 | Ht 63.0 in | Wt 283.6 lb

## 2018-06-08 DIAGNOSIS — M1990 Unspecified osteoarthritis, unspecified site: Secondary | ICD-10-CM

## 2018-06-08 DIAGNOSIS — K219 Gastro-esophageal reflux disease without esophagitis: Secondary | ICD-10-CM

## 2018-06-08 DIAGNOSIS — E538 Deficiency of other specified B group vitamins: Secondary | ICD-10-CM | POA: Diagnosis not present

## 2018-06-08 DIAGNOSIS — N3946 Mixed incontinence: Secondary | ICD-10-CM

## 2018-06-08 DIAGNOSIS — F331 Major depressive disorder, recurrent, moderate: Secondary | ICD-10-CM

## 2018-06-08 DIAGNOSIS — I10 Essential (primary) hypertension: Secondary | ICD-10-CM

## 2018-06-08 DIAGNOSIS — Z136 Encounter for screening for cardiovascular disorders: Secondary | ICD-10-CM

## 2018-06-08 DIAGNOSIS — E785 Hyperlipidemia, unspecified: Secondary | ICD-10-CM

## 2018-06-08 DIAGNOSIS — K449 Diaphragmatic hernia without obstruction or gangrene: Secondary | ICD-10-CM

## 2018-06-08 DIAGNOSIS — Z0000 Encounter for general adult medical examination without abnormal findings: Secondary | ICD-10-CM | POA: Diagnosis not present

## 2018-06-08 DIAGNOSIS — R7309 Other abnormal glucose: Secondary | ICD-10-CM | POA: Diagnosis not present

## 2018-06-08 DIAGNOSIS — Z79899 Other long term (current) drug therapy: Secondary | ICD-10-CM

## 2018-06-08 DIAGNOSIS — E559 Vitamin D deficiency, unspecified: Secondary | ICD-10-CM | POA: Diagnosis not present

## 2018-06-08 DIAGNOSIS — Z23 Encounter for immunization: Secondary | ICD-10-CM

## 2018-06-08 DIAGNOSIS — D649 Anemia, unspecified: Secondary | ICD-10-CM | POA: Diagnosis not present

## 2018-06-08 DIAGNOSIS — E041 Nontoxic single thyroid nodule: Secondary | ICD-10-CM

## 2018-06-08 MED ORDER — BUPROPION HCL ER (XL) 150 MG PO TB24: 150.0000 mg | ORAL_TABLET | ORAL | 2 refills | Status: DC

## 2018-06-08 NOTE — Patient Instructions (Addendum)
Will start you on wellbutrin to try to help with mood/weight loss If you get nausea and HA after the first week please stop If you get anxious or snappy with people than stop the medication But this medication can help with energy, weight loss and mood It kicks in about 1-2 weeks And can be stopped quickly  YOU CAN CALL TO MAKE AN ULTRASOUND..  I have put in an order for an ultrasound for you to have You can set them up at your convenience by calling this number [phone] You will likely have the ultrasound at [facility]  If you have any issues call our office and we will set this up for you.    Your ears and sinuses are connected by the eustachian tube. When your sinuses are inflamed, this can close off the tube and cause fluid to collect in your middle ear. This can then cause dizziness, popping, clicking, ringing, and echoing in your ears. This is often NOT an infection and does NOT require antibiotics, it is caused by inflammation so the treatments help the inflammation. This can take a long time to get better so please be patient.  Here are things you can do to help with this: - Try the Flonase or Nasonex. Remember to spray each nostril twice towards the outer part of your eye.  Do not sniff but instead pinch your nose and tilt your head back to help the medicine get into your sinuses.  The best time to do this is at bedtime.Stop if you get blurred vision or nose bleeds.  -While drinking fluids, pinch and hold nose close and swallow, to help open eustachian tubes to drain fluid behind ear drums. -Please pick one of the over the counter allergy medications below and take it once daily for allergies.  It will also help with fluid behind ear drums. Claritin or loratadine cheapest but likely the weakest  Zyrtec or certizine at night because it can make you sleepy The strongest is allegra or fexafinadine  Cheapest at [facility], [employer], [employer] -can use decongestant over the  counter, please do not use if you have high blood pressure or certain heart conditions.   if worsening HA, changes vision/speech, imbalance, weakness go to the ER   Multinodular thyroid goiter or Multinodular thyroid gland.  Goiter means thyroid enlargement so if your thyroid is enlarged it is multinodular thyroid goiter or otherwise it is called multinodular thyroid gland.  It is very common, in more than 10 % of the population. That is about 30,000 people just here in [place]. The great majority are completely benign. We do not recommend biopsy until the nodules reach 79mm. Otherwise we monitor your thyroid with an ultrasound and repeated blood work monitoring its function. If they nodules are not increasing in size, we often stop monitoring with ultrasound.      When it comes to diets, agreement about the perfect plan isn't easy to find, even among the experts. Experts at the [employer] developed an idea known as the Healthy Eating Plate. Just imagine a plate divided into logical, healthy portions.  The emphasis is on diet quality:  Load up on vegetables and fruits - one-half of your plate: Aim for color and variety, and remember that potatoes don't count.  Go for whole grains - one-quarter of your plate: Whole wheat, barley, wheat berries, quinoa, oats, brown rice, and foods made with them. If you want pasta, go  with whole wheat pasta.  Protein power - one-quarter of your plate: Fish, chicken, beans, and nuts are all healthy, versatile protein sources. Limit red meat.  The diet, however, does go beyond the plate, offering a few other suggestions.  Use healthy plant oils, such as olive, canola, soy, corn, sunflower and peanut. Check the labels, and avoid partially hydrogenated oil, which have unhealthy trans fats.  If you're thirsty, drink water. Coffee and tea are good in moderation, but skip sugary drinks and limit milk and dairy products to one or two daily  servings.  The type of carbohydrate in the diet is more important than the amount. Some sources of carbohydrates, such as vegetables, fruits, whole grains, and beans-are healthier than others.  Finally, stay active.  Intermittent fasting is more about strategy than starvation. It's meant to reset your body in different ways, hopefully with fitness and nutrition changes as a result.  Like any big switchover, though, results may vary when it comes down to the individual level. What works for your friends may not work for you, or vice versa. That's why it's helpful to play around with variations on intermittent fasting and healthy habits and find what works best for you.  WHAT IS INTERMITTENT FASTING AND WHY DO IT?  Intermittent fasting doesn't involve specific foods, but rather, a strict schedule regarding when you eat. Also called "time-restricted eating," the tactic has been praised for its contribution to weight loss, improved body composition, and decreased cravings. Preliminary research also suggests it may be beneficial for glucose tolerance, hormone regulation, better muscle mass and lower body fat.  Part of its appeal is the simplicity of the effort. Unlike some other trends, there's no calculations to intermittent fasting.  You simply eat within a certain block of time, usually a window of 8-10 hours. In the other big block of time - about 14-16 hours, including when you're asleep - you don't eat anything, not even snacks. You can drink water, coffee, tea or any other beverage that doesn't have calories.  For example, if you like having a late dinner, you might skip breakfast and have your first meal at noon and your last meal of the day at 8 p.m., and then not eat until noon again the next day.  IDEAS FOR GETTING STARTED  If you're new to the strategy, it may be helpful to eat within the typical circadian rhythm and keep eating within daylight hours. This can be especially beneficial if  you're looking at intermittent fasting for weight-loss goals.  So first try only eating between 12pm to 8pm.  Outside of this time you may have water, black coffee, and hot tea. You may not eat it drink anything that has carbs, sugars, OR artificial sugars like diet soda.   Like any major eating and fitness shift, it can take time to find the perfect fit, so don't be afraid to experiment with different options - including ditching intermittent fasting altogether if it's simply not for you. But if it is, you may be surprised by some of the benefits that come along with the strategy.

## 2018-06-09 ENCOUNTER — Other Ambulatory Visit: Payer: Self-pay | Admitting: Physician Assistant

## 2018-06-09 LAB — URINALYSIS, ROUTINE W REFLEX MICROSCOPIC
Bilirubin Urine: NEGATIVE
Glucose, UA: NEGATIVE
Hgb urine dipstick: NEGATIVE
Hyaline Cast: NONE SEEN /LPF
Ketones, ur: NEGATIVE
Nitrite: POSITIVE — AB
Protein, ur: NEGATIVE
Specific Gravity, Urine: 1.005 (ref 1.001–1.03)
pH: 6.5 (ref 5.0–8.0)

## 2018-06-09 LAB — CBC WITH DIFFERENTIAL/PLATELET
Absolute Monocytes: 586 cells/uL (ref 200–950)
Basophils Absolute: 58 cells/uL (ref 0–200)
Basophils Relative: 1 %
Eosinophils Absolute: 319 cells/uL (ref 15–500)
Eosinophils Relative: 5.5 %
HCT: 47.7 % — ABNORMAL HIGH (ref 35.0–45.0)
Hemoglobin: 15.7 g/dL — ABNORMAL HIGH (ref 11.7–15.5)
Lymphs Abs: 1717 cells/uL (ref 850–3900)
MCH: 29.7 pg (ref 27.0–33.0)
MCHC: 32.9 g/dL (ref 32.0–36.0)
MCV: 90.3 fL (ref 80.0–100.0)
MPV: 10.5 fL (ref 7.5–12.5)
Monocytes Relative: 10.1 %
Neutro Abs: 3120 cells/uL (ref 1500–7800)
Neutrophils Relative %: 53.8 %
Platelets: 276 10*3/uL (ref 140–400)
RBC: 5.28 10*6/uL — ABNORMAL HIGH (ref 3.80–5.10)
RDW: 13.5 % (ref 11.0–15.0)
Total Lymphocyte: 29.6 %
WBC: 5.8 10*3/uL (ref 3.8–10.8)

## 2018-06-09 LAB — MAGNESIUM: Magnesium: 1.9 mg/dL (ref 1.5–2.5)

## 2018-06-09 LAB — COMPLETE METABOLIC PANEL WITH GFR
AG Ratio: 1.4 (calc) (ref 1.0–2.5)
ALT: 52 U/L — ABNORMAL HIGH (ref 6–29)
AST: 47 U/L — ABNORMAL HIGH (ref 10–35)
Albumin: 4.4 g/dL (ref 3.6–5.1)
Alkaline phosphatase (APISO): 70 U/L (ref 37–153)
BUN: 17 mg/dL (ref 7–25)
CO2: 25 mmol/L (ref 20–32)
Calcium: 10.3 mg/dL (ref 8.6–10.4)
Chloride: 102 mmol/L (ref 98–110)
Creat: 0.82 mg/dL (ref 0.50–0.99)
GFR, Est African American: 90 mL/min/{1.73_m2} (ref >=60)
GFR, Est Non African American: 77 mL/min/{1.73_m2} (ref >=60)
Globulin: 3.2 g/dL (calc) (ref 1.9–3.7)
Glucose, Bld: 89 mg/dL (ref 65–99)
Potassium: 4.4 mmol/L (ref 3.5–5.3)
Sodium: 140 mmol/L (ref 135–146)
Total Bilirubin: 0.7 mg/dL (ref 0.2–1.2)
Total Protein: 7.6 g/dL (ref 6.1–8.1)

## 2018-06-09 LAB — IRON, TOTAL/TOTAL IRON BINDING CAP
%SAT: 24 % (calc) (ref 16–45)
Iron: 102 ug/dL (ref 45–160)
TIBC: 429 mcg/dL (calc) (ref 250–450)

## 2018-06-09 LAB — LIPID PANEL
Cholesterol: 201 mg/dL — ABNORMAL HIGH (ref <200)
HDL: 46 mg/dL — ABNORMAL LOW (ref >=50)
LDL Cholesterol (Calc): 128 mg/dL (calc) — ABNORMAL HIGH
Non-HDL Cholesterol (Calc): 155 mg/dL (calc) — ABNORMAL HIGH (ref <130)
Total CHOL/HDL Ratio: 4.4 (calc) (ref <5.0)
Triglycerides: 156 mg/dL — ABNORMAL HIGH (ref <150)

## 2018-06-09 LAB — MICROALBUMIN / CREATININE URINE RATIO
Creatinine, Urine: 22 mg/dL (ref 20–275)
Microalb Creat Ratio: 9 mcg/mg creat (ref <30)
Microalb, Ur: 0.2 mg/dL

## 2018-06-09 LAB — VITAMIN D 25 HYDROXY (VIT D DEFICIENCY, FRACTURES): Vit D, 25-Hydroxy: 30 ng/mL (ref 30–100)

## 2018-06-09 LAB — HEMOGLOBIN A1C
Hgb A1c MFr Bld: 5.5 % of total Hgb (ref <5.7)
Mean Plasma Glucose: 111 (calc)
eAG (mmol/L): 6.2 (calc)

## 2018-06-09 LAB — VITAMIN B12: Vitamin B-12: 308 pg/mL (ref 200–1100)

## 2018-06-09 LAB — FERRITIN: Ferritin: 81 ng/mL (ref 16–288)

## 2018-06-09 LAB — TSH: TSH: 2.5 mIU/L (ref 0.40–4.50)

## 2018-06-09 MED ORDER — SULFAMETHOXAZOLE-TRIMETHOPRIM 800-160 MG PO TABS: 1.0000 | ORAL_TABLET | Freq: Two times a day (BID) | ORAL | 0 refills | Status: DC

## 2018-06-13 ENCOUNTER — Other Ambulatory Visit: Payer: Self-pay

## 2018-06-13 ENCOUNTER — Ambulatory Visit
Admission: RE | Admit: 2018-06-13 | Discharge: 2018-06-13 | Disposition: A | Discharge status: Home / Self Care | Payer: Medicare HMO | Source: Ambulatory Visit | Attending: Physician Assistant | Admitting: Physician Assistant

## 2018-06-13 DIAGNOSIS — E041 Nontoxic single thyroid nodule: Secondary | ICD-10-CM

## 2018-06-13 DIAGNOSIS — E0789 Other specified disorders of thyroid: Secondary | ICD-10-CM | POA: Diagnosis not present

## 2018-06-24 ENCOUNTER — Other Ambulatory Visit: Payer: Self-pay

## 2018-06-24 ENCOUNTER — Ambulatory Visit
Admission: RE | Admit: 2018-06-24 | Discharge: 2018-06-24 | Disposition: A | Discharge status: Home / Self Care | Payer: Medicare HMO | Source: Ambulatory Visit | Attending: Physician Assistant | Admitting: Physician Assistant

## 2018-06-24 DIAGNOSIS — Z1231 Encounter for screening mammogram for malignant neoplasm of breast: Secondary | ICD-10-CM

## 2018-07-04 ENCOUNTER — Other Ambulatory Visit: Payer: Self-pay

## 2018-07-04 DIAGNOSIS — I10 Essential (primary) hypertension: Secondary | ICD-10-CM

## 2018-07-04 DIAGNOSIS — N3946 Mixed incontinence: Secondary | ICD-10-CM

## 2018-07-04 MED ORDER — OXYBUTYNIN CHLORIDE 5 MG PO TABS: 5.0000 mg | ORAL_TABLET | Freq: Three times a day (TID) | ORAL | 1 refills | Status: DC

## 2018-07-04 MED ORDER — VALSARTAN-HYDROCHLOROTHIAZIDE 320-12.5 MG PO TABS: 1.0000 | ORAL_TABLET | Freq: Every day | ORAL | 1 refills | Status: DC

## 2018-07-04 NOTE — Telephone Encounter (Signed)
Refill request for Valsartan and Oxybutynin.

## 2018-07-11 ENCOUNTER — Ambulatory Visit: Payer: Medicare HMO

## 2018-07-11 ENCOUNTER — Other Ambulatory Visit: Payer: Self-pay

## 2018-07-11 VITALS — BP 136/80 | HR 87 | Temp 97.5°F | Ht 63.0 in | Wt 294.0 lb

## 2018-07-11 DIAGNOSIS — R8271 Bacteriuria: Secondary | ICD-10-CM

## 2018-07-11 NOTE — Addendum Note (Signed)
Addended by: Eulis Canner on: 07/11/2018 10:38 AM   Modules accepted: Orders

## 2018-07-13 ENCOUNTER — Other Ambulatory Visit: Payer: Self-pay | Admitting: Physician Assistant

## 2018-07-13 LAB — URINE CULTURE
MICRO NUMBER:: 641948
SPECIMEN QUALITY:: ADEQUATE

## 2018-07-13 MED ORDER — SULFAMETHOXAZOLE-TRIMETHOPRIM 800-160 MG PO TABS: 1.0000 | ORAL_TABLET | Freq: Two times a day (BID) | ORAL | 0 refills | Status: DC

## 2018-08-16 NOTE — Progress Notes (Signed)
63 y.o.female presents for a follow up for weight loss and depression.  She was started on wellbutrin for depression, stopped 2 weeks ago due to diarrhea but she continue to have diarrhea. She has been taking pepto diarrhea occ.  Every other day, will be 2-3 times a day, very watery. Has seen [facility] in the past and has been diagnosed with IBS.  Patient denies blood in stool, fever, significant abdominal pain, unintentional weight loss, bad odor.  Patient denies recent antibiotic treatment with bactrim.   She is not taking the oxybutynin nightly, she is on magnesium.  Last Colonoscopy: 07/2014  She also had a low B12 and abnormal liver function. She also has had abnormal urine, no symptoms currently, treated with bactrim 07/09.  She is on B12 pills but not taking consistently.  Lab Results  Component Value Date   VITAMINB12 308 06/08/2018   CT AB normal liver 2016.  Lab Results  Component Value Date   ALT 52 (H) 06/08/2018   AST 47 (H) 06/08/2018   ALKPHOS 72 12/19/2014   BILITOT 0.7 06/08/2018   BP Readings from Last 3 Encounters:  08/21/18 130/60  07/11/18 136/80  06/08/18 124/68  .  BMI is Body mass index is 51.55 kg/m., she is working on diet and exercise. Wt Readings from Last 3 Encounters:  08/21/18 291 lb (132 kg)  07/11/18 294 lb (133.4 kg)  06/08/18 283 lb 9.6 oz (128.6 kg)    Medications: Current Outpatient Medications on File Prior to Visit  Medication Sig Dispense Refill  . cholecalciferol (VITAMIN D) 1000 UNITS tablet Take 1,000 Units by mouth daily.    . cyclobenzaprine (FLEXERIL) 10 MG tablet Take 1 tablet (10 mg total) by mouth 3 (three) times daily as needed for muscle spasms. 270 tablet 3  . diphenhydramine-acetaminophen (TYLENOL PM) 25-500 MG TABS Take 2 tablets by mouth at bedtime as needed.    . Lactobacillus Casei-Folic Acid 35-3.29 MG CAPS 1 pill daily 90 capsule 0  . magnesium gluconate (MAGONATE) 500 MG tablet Take 500 mg by mouth 2 (two) times daily.     . meloxicam (MOBIC) 15 MG tablet Take 1 tablet (15 mg total) by mouth daily. 90 tablet 1  . oxybutynin (DITROPAN) 5 MG tablet Take 1 tablet (5 mg total) by mouth 3 (three) times daily. 90 tablet 1  . valsartan-hydrochlorothiazide (DIOVAN-HCT) 320-12.5 MG tablet Take 1 tablet by mouth daily. 90 tablet 1  . buPROPion (WELLBUTRIN XL) 150 MG 24 hr tablet Take 1 tablet (150 mg total) by mouth every morning. (Patient not taking: Reported on 08/21/2018) 30 tablet 2  . lansoprazole (PREVACID) 30 MG capsule Take 1 capsule (30 mg total) by mouth 2 (two) times daily before a meal. 180 capsule 1  . sulfamethoxazole-trimethoprim (BACTRIM DS) 800-160 MG tablet Take 1 tablet by mouth 2 (two) times daily. (Patient not taking: Reported on 08/21/2018) 10 tablet 0   No current facility-administered medications on file prior to visit.     ROS: All negative except for above  Physical exam: Vitals:   08/21/18 0859  BP: 130/60  Pulse: (!) 101  Resp: 14  Temp: (!) 97.5 F (36.4 C)  SpO2: 95%   Physical Exam Constitutional:      General: She is not in acute distress.    Appearance: She is well-developed. She is obese. She is not ill-appearing.  HENT:     Head: Normocephalic and atraumatic.     Right Ear: External ear normal.  Left Ear: External ear normal.  Eyes:     Conjunctiva/sclera: Conjunctivae normal.     Pupils: Pupils are equal, round, and reactive to light.  Neck:     Musculoskeletal: Normal range of motion and neck supple.     Thyroid: No thyromegaly.  Cardiovascular:     Rate and Rhythm: Normal rate and regular rhythm.     Heart sounds: Normal heart sounds. No murmur. No friction rub. No gallop.   Pulmonary:     Effort: Pulmonary effort is normal. No respiratory distress.     Breath sounds: Normal breath sounds. No wheezing.  Abdominal:     General: Abdomen is protuberant. Bowel sounds are normal. There is no distension.     Palpations: Abdomen is soft. There is no mass.      Tenderness: There is no abdominal tenderness. There is no guarding or rebound. Negative signs include Murphy's sign and McBurney's sign.  Musculoskeletal: Normal range of motion.  Lymphadenopathy:     Cervical: No cervical adenopathy.  Skin:    General: Skin is warm and dry.  Neurological:     Mental Status: She is alert and oriented to person, place, and time.     Cranial Nerves: No cranial nerve deficit.     Coordination: Coordination normal.     Deep Tendon Reflexes: Reflexes normal.     Assessment and plan: Obesity with co morbid conditions General weight loss/lifestyle modification strategies discussed (elicit support from others; identify saboteurs; non-food rewards, etc). Continue food diary Continue restricted calorie diet Continue daily exercise as well as behavior modification such as walking further away, putting down the fork, having a plan, using stairs, etc.   Abnormal urine Recheck urine  Diarrhea adjust magnesium Get back on oxybutinin Normal exam, no other symptoms Call if any changes  B12 def Check labs next Ov  Abnormal LFTs Likely fatty liver Recheck next OV 1 month  Vitamin D def Get on 5000 a day, she has not been on anything   Future Appointments  Date Time Provider [place]  09/21/2018 10:30 AM [name], MD [facility] None  06/12/2019 10:00 AM Vicie Mutters, PA-C [facility] None

## 2018-08-17 ENCOUNTER — Ambulatory Visit: Payer: Medicare HMO | Admitting: Physician Assistant

## 2018-08-21 ENCOUNTER — Other Ambulatory Visit: Payer: Self-pay

## 2018-08-21 ENCOUNTER — Ambulatory Visit (INDEPENDENT_AMBULATORY_CARE_PROVIDER_SITE_OTHER): Payer: Medicare HMO | Admitting: Physician Assistant

## 2018-08-21 VITALS — BP 130/60 | HR 101 | Temp 97.5°F | Resp 14 | Wt 291.0 lb

## 2018-08-21 DIAGNOSIS — Z79899 Other long term (current) drug therapy: Secondary | ICD-10-CM | POA: Diagnosis not present

## 2018-08-21 DIAGNOSIS — E538 Deficiency of other specified B group vitamins: Secondary | ICD-10-CM

## 2018-08-21 DIAGNOSIS — N3946 Mixed incontinence: Secondary | ICD-10-CM | POA: Diagnosis not present

## 2018-08-21 DIAGNOSIS — E559 Vitamin D deficiency, unspecified: Secondary | ICD-10-CM | POA: Diagnosis not present

## 2018-08-21 MED ORDER — OXYBUTYNIN CHLORIDE 5 MG PO TABS: 5.0000 mg | ORAL_TABLET | Freq: Three times a day (TID) | ORAL | 1 refills | Status: DC

## 2018-08-21 NOTE — Patient Instructions (Signed)
Get on sublingual B12 Get back on oxybutynin nightly and can take during the day  Remember it can cause constipation/dry mouth  Ways to prevent diarrhea with magnesium:  1) Don't take all your magnesium at the same time, have 2-3 smaller doses through out the day 2) Try taking your magnesium with high fiber meals.  3) If this does not help, take the magnesium on an empty stomach. Fiber for some people can bind the magnesium too well and prevent absorption in your gut.  4) Lastly try different types of magnesium. Most people are taking magnesium citrate, you can also try dimalate capsules which are slow release. You can also find magnesium lotions/sprays for the skin that bypass the gut. Another one that has good absorption is ReMag (pico-iconic magnesium formula), this has great cellular absorption so less of a laxative effect. You can find these type at health food stores or online.     Please go to the ER if you have any severe AB pain, unable to hold down food/water, blood in stool or vomit, chest pain, shortness of breath, or any worsening symptoms.   Consider keeping a food diary- common causes of diarrhea are dairy, certain carbs...  FODMAP stands for fermentable oligo-, di-, mono-saccharides and polyols (1). These are the scientific terms used to classify groups of carbs that are notorious for triggering digestive symptoms like bloating, gas and stomach pain.   FODMAPs are found in a wide range of foods in varying amounts. Some foods contain just one type, while others contain several.  The main dietary sources of the four groups of FODMAPs include:  Oligosaccharides: Wheat, rye, legumes and various fruits and vegetables, such as garlic and onions.  Disaccharides: Milk, yogurt and soft cheese. Lactose is the main carb.  Monosaccharides: Various fruit including figs and mangoes, and sweeteners such as honey and agave nectar. Fructose is the main carb.  Polyols: Certain fruits and  vegetables including blackberries and lychee, as well as some low-calorie sweeteners like those in sugar-free gum.   Keep a food diary. This will help you identify foods that cause symptoms. Write down: ? What you eat and when. ? What symptoms you have. ? When symptoms occur in relation to your meals.  Avoid foods that cause symptoms. Talk with your dietitian about other ways to get the same nutrients that are in these foods.  Eat your meals slowly, in a relaxed setting.  Aim to eat 5-6 small meals per day. Do not skip meals.  Drink enough fluids to keep your urine clear or pale yellow.  Ask your health care provider if you should take an over-the-counter probiotic during flare-ups to help restore healthy gut bacteria.  If you have cramping or diarrhea, try making your meals low in fat and high in carbohydrates. Examples of carbohydrates are pasta, rice, whole grain breads and cereals, fruits, and vegetables.  If dairy products cause your symptoms to flare up, try eating less of them. You might be able to handle yogurt better than other dairy products because it contains bacteria that help with digestion.

## 2018-08-23 LAB — URINALYSIS, ROUTINE W REFLEX MICROSCOPIC
Bacteria, UA: NONE SEEN /HPF
Bilirubin Urine: NEGATIVE
Glucose, UA: NEGATIVE
Hyaline Cast: NONE SEEN /LPF
Ketones, ur: NEGATIVE
Nitrite: POSITIVE — AB
Protein, ur: NEGATIVE
Specific Gravity, Urine: 1.006 (ref 1.001–1.03)
pH: 7 (ref 5.0–8.0)

## 2018-08-23 LAB — URINE CULTURE
MICRO NUMBER:: 779386
SPECIMEN QUALITY:: ADEQUATE

## 2018-08-28 DIAGNOSIS — N3000 Acute cystitis without hematuria: Secondary | ICD-10-CM | POA: Diagnosis not present

## 2018-08-28 DIAGNOSIS — R8279 Other abnormal findings on microbiological examination of urine: Secondary | ICD-10-CM | POA: Diagnosis not present

## 2018-09-05 NOTE — Telephone Encounter (Signed)
Holly at [facility] faxed 08/28/2018 note

## 2018-09-21 ENCOUNTER — Other Ambulatory Visit: Payer: Self-pay

## 2018-09-21 ENCOUNTER — Ambulatory Visit (INDEPENDENT_AMBULATORY_CARE_PROVIDER_SITE_OTHER): Payer: Medicare HMO | Admitting: Internal Medicine

## 2018-09-21 ENCOUNTER — Encounter: Payer: Self-pay | Admitting: Internal Medicine

## 2018-09-21 VITALS — BP 120/76 | HR 84 | Temp 97.8°F | Resp 16 | Ht 63.0 in | Wt 281.0 lb

## 2018-09-21 DIAGNOSIS — E782 Mixed hyperlipidemia: Secondary | ICD-10-CM

## 2018-09-21 DIAGNOSIS — E559 Vitamin D deficiency, unspecified: Secondary | ICD-10-CM

## 2018-09-21 DIAGNOSIS — R7309 Other abnormal glucose: Secondary | ICD-10-CM | POA: Diagnosis not present

## 2018-09-21 DIAGNOSIS — Z79899 Other long term (current) drug therapy: Secondary | ICD-10-CM

## 2018-09-21 DIAGNOSIS — Z6841 Body Mass Index (BMI) 40.0 and over, adult: Secondary | ICD-10-CM | POA: Diagnosis not present

## 2018-09-21 DIAGNOSIS — I10 Essential (primary) hypertension: Secondary | ICD-10-CM

## 2018-09-21 DIAGNOSIS — R7303 Prediabetes: Secondary | ICD-10-CM | POA: Diagnosis not present

## 2018-09-21 DIAGNOSIS — E66813 Obesity, class 3: Secondary | ICD-10-CM

## 2018-09-21 DIAGNOSIS — N3000 Acute cystitis without hematuria: Secondary | ICD-10-CM | POA: Diagnosis not present

## 2018-09-21 NOTE — Patient Instructions (Signed)

## 2018-09-21 NOTE — Progress Notes (Signed)
History of Present Illness:      This very nice 63 y.o.  Single WF presents for 3 month follow up with HTN, HLD, Pre-Diabetes and Vitamin D Deficiency.       Patient is treated for HTN (2000) & BP has been controlled at home. Today's BP is at goal - 120/76. Patient has had no complaints of any cardiac type chest pain, palpitations, dyspnea / orthopnea / PND, dizziness, claudication, or dependent edema.      Hyperlipidemia is not controlled with diet. Last Lipids were not at goal: Lab Results  Component Value Date   CHOL 200 (H) 09/21/2018   HDL 37 (L) 09/21/2018   LDLCALC 124 (H) 09/21/2018   TRIG 255 (H) 09/21/2018   CHOLHDL 5.4 (H) 09/21/2018       Also, the patient has severe Morbid Obesity (BMI 49+) and  history of PreDiabetes  (A1c 5.7% / 2015 & 5.9% / 2016)  and has had no symptoms of reactive hypoglycemia, diabetic polys, paresthesias or visual blurring.  Last A1c was Normal & at goal: Lab Results  Component Value Date   HGBA1C 5.6 09/21/2018       Further, the patient also has history of Vitamin D Deficiency ("11" / 2008)  and does not  supplements vitamin D as recommended. Last vitamin D was still very low: Lab Results  Component Value Date   VD25OH 34 09/21/2018   Current Outpatient Medications on File Prior to Visit  Medication Sig  . cholecalciferol (VITAMIN D) 1000 UNITS tablet Take 1,000 Units by mouth 3 (three) times daily.   Marland Kitchen CRANBERRY PO Take 2 capsules by mouth daily.  . cyclobenzaprine (FLEXERIL) 10 MG tablet Take 1 tablet (10 mg total) by mouth 3 (three) times daily as needed for muscle spasms.  . diphenhydramine-acetaminophen (TYLENOL PM) 25-500 MG TABS Take 2 tablets by mouth at bedtime as needed.  . Lactobacillus Casei-Folic Acid 123XX123 MG CAPS 1 pill daily  . magnesium gluconate (MAGONATE) 500 MG tablet Take 500 mg by mouth 2 (two) times daily.  . meloxicam (MOBIC) 15 MG tablet Take 1 tablet (15 mg total) by mouth daily.  Marland Kitchen oxybutynin (DITROPAN) 5  MG tablet Take 1 tablet (5 mg total) by mouth 3 (three) times daily.  . Probiotic Product (PROBIOTIC DAILY PO) Take 1 tablet by mouth daily.  . valsartan-hydrochlorothiazide (DIOVAN-HCT) 320-12.5 MG tablet Take 1 tablet by mouth daily.   No current facility-administered medications on file prior to visit.    Allergies  Allergen Reactions  . Keflex [Cephalexin] Anaphylaxis and Swelling  . Codeine     REACTION: nausea/vomiting   PMHx:   Past Medical History:  Diagnosis Date  . Arthritis   . Asthma   . Environmental allergies   . GERD (gastroesophageal reflux disease)   . Headache   . Hiatal hernia    with stricture dilation  . Hyperlipidemia   . Hypertension   . Kidney stones 2008  . Obesity, unspecified   . PONV (postoperative nausea and vomiting)   . UTI (urinary tract infection)    Immunization History  Administered Date(s) Administered  . Influenza Inj Mdck Quad Pf 11/02/2016  . Influenza, Quadrivalent, Recombinant, Inj, Pf 10/06/2017  . Influenza-Unspecified 11/22/2014, 10/31/2015, 10/06/2017  . PPD Test 06/01/2013  . Pneumococcal Polysaccharide-23 06/08/2018  . Tdap 03/22/2017   Past Surgical History:  Procedure Laterality Date  . BREAST BIOPSY Left    benign  . CHOLECYSTECTOMY  1987  .  ESOPHAGEAL DILATION    . KNEE ARTHROSCOPY Right 2007  . REVERSE SHOULDER ARTHROPLASTY Right 06/04/2014   Procedure: REVERSE SHOULDER ARTHROPLASTY;  Surgeon: Meredith Pel, MD;  Location: [place];  Service: Orthopedics;  Laterality: Right;  . TOTAL HIP ARTHROPLASTY Right 03/19/2014   Procedure: TOTAL HIP ARTHROPLASTY ANTERIOR APPROACH;  Surgeon: Meredith Pel, MD;  Location: [place];  Service: Orthopedics;  Laterality: Right;   FHx:    Reviewed / unchanged  SHx:    Reviewed / unchanged   Systems Review:  Constitutional: Denies fever, chills, wt changes, headaches, insomnia, fatigue, night sweats, change in appetite. Eyes: Denies redness, blurred vision, diplopia,  discharge, itchy, watery eyes.  ENT: Denies discharge, congestion, post nasal drip, epistaxis, sore throat, earache, hearing loss, dental pain, tinnitus, vertigo, sinus pain, snoring.  CV: Denies chest pain, palpitations, irregular heartbeat, syncope, dyspnea, diaphoresis, orthopnea, PND, claudication or edema. Respiratory: denies cough, dyspnea, DOE, pleurisy, hoarseness, laryngitis, wheezing.  Gastrointestinal: Denies dysphagia, odynophagia, heartburn, reflux, water brash, abdominal pain or cramps, nausea, vomiting, bloating, diarrhea, constipation, hematemesis, melena, hematochezia  or hemorrhoids. Genitourinary: Denies dysuria, frequency, urgency, nocturia, hesitancy, discharge, hematuria or flank pain. Musculoskeletal: Denies arthralgias, myalgias, stiffness, jt. swelling, pain, limping or strain/sprain.  Skin: Denies pruritus, rash, hives, warts, acne, eczema or change in skin lesion(s). Neuro: No weakness, tremor, incoordination, spasms, paresthesia or pain. Psychiatric: Denies confusion, memory loss or sensory loss. Endo: Denies change in weight, skin or hair change.  Heme/Lymph: No excessive bleeding, bruising or enlarged lymph nodes.  Physical Exam  BP 120/76   Pulse 84   Temp 97.8 F (36.6 C)   Resp 16   Ht 5\' 3"  (1.6 m)   Wt 281 lb (127.5 kg)   LMP 10/18/2013   BMI 49.78 kg/m   Appears  over nourished, well groomed  and in no distress.  Eyes: PERRLA, EOMs, conjunctiva no swelling or erythema. Sinuses: No frontal/maxillary tenderness ENT/Mouth: EAC's clear, TM's nl w/o erythema, bulging. Nares clear w/o erythema, swelling, exudates. Oropharynx clear without erythema or exudates. Oral hygiene is good. Tongue normal, non obstructing. Hearing intact.  Neck: Supple. Thyroid not palpable. Car 2+/2+ without bruits, nodes or JVD. Chest: Respirations nl with BS clear & equal w/o rales, rhonchi, wheezing or stridor.  Cor: Heart sounds normal w/ regular rate and rhythm without sig.  murmurs, gallops, clicks or rubs. Peripheral pulses normal and equal  without edema.  Abdomen: Soft & bowel sounds normal. Non-tender w/o guarding, rebound, hernias, masses or organomegaly.  Lymphatics: Unremarkable.  Musculoskeletal: Full ROM all peripheral extremities, joint stability, 5/5 strength and normal gait.  Skin: Warm, dry without exposed rashes, lesions or ecchymosis apparent.  Neuro: Cranial nerves intact, reflexes equal bilaterally. Sensory-motor testing grossly intact. Tendon reflexes grossly intact.  Pysch: Alert & oriented x 3.  Insight and judgement nl & appropriate. No ideations.  Assessment and Plan:  1. Essential hypertension  - Continue medication, monitor blood pressure at home.  - Continue DASH diet.  Reminder to go to the ER if any CP,  SOB, nausea, dizziness, severe HA, changes vision/speech.  - CBC with Differential/Platelet - COMPLETE METABOLIC PANEL WITH GFR - Magnesium - TSH  2. Hyperlipidemia, mixed  - Continue diet/meds, exercise,& lifestyle modifications.  - Continue monitor periodic cholesterol/liver & renal functions   - Lipid panel - TSH  3. Abnormal glucose  - Continue diet, exercise  - Lifestyle modifications.  - Monitor appropriate labs.  - Hemoglobin A1c - Insulin, random  4. Vitamin D deficiency  -  Continue supplementation.  - VITAMIN D 25 Hydroxyl  5. Prediabetes  - Hemoglobin A1c - Insulin, random  6. Class 3 severe obesity due to excess calories with  serious comorbidity and body mass index (BMI)  of 50.0 to 59.9 in adult ([place])  7. Medication management  - CBC with Differential/Platelet - COMPLETE METABOLIC PANEL WITH GFR - Magnesium - Lipid panel - TSH - Hemoglobin A1c - Insulin, random - VITAMIN D 25 Hydroxyl        Discussed  regular exercise, BP monitoring, weight control to achieve/maintain BMI less than 25 and discussed med and SE's. Recommended labs to assess and monitor clinical status with further  disposition pending results of labs.  I discussed the assessment and treatment plan with the patient. The patient was provided an opportunity to ask questions and all were answered. The patient agreed with the plan and demonstrated an understanding of the instructions.  I provided over 30 minutes of exam, counseling, chart review and  complex critical decision making.  Kirtland Bouchard, MD .

## 2018-09-22 ENCOUNTER — Other Ambulatory Visit: Payer: Self-pay | Admitting: Internal Medicine

## 2018-09-22 DIAGNOSIS — N3000 Acute cystitis without hematuria: Secondary | ICD-10-CM

## 2018-09-22 DIAGNOSIS — E782 Mixed hyperlipidemia: Secondary | ICD-10-CM

## 2018-09-22 MED ORDER — CIPROFLOXACIN HCL 250 MG PO TABS: ORAL_TABLET | ORAL | 0 refills | Status: DC

## 2018-09-22 MED ORDER — ROSUVASTATIN CALCIUM 20 MG PO TABS: ORAL_TABLET | ORAL | 1 refills | Status: DC

## 2018-09-23 ENCOUNTER — Encounter: Payer: Self-pay | Admitting: Internal Medicine

## 2018-09-23 LAB — URINALYSIS, ROUTINE W REFLEX MICROSCOPIC
Bilirubin Urine: NEGATIVE
Glucose, UA: NEGATIVE
Hyaline Cast: NONE SEEN /LPF
Ketones, ur: NEGATIVE
Nitrite: POSITIVE — AB
Protein, ur: NEGATIVE
Specific Gravity, Urine: 1.008 (ref 1.001–1.03)
WBC, UA: >=60 /HPF — AB (ref 0–5)
pH: 6 (ref 5.0–8.0)

## 2018-09-23 LAB — COMPLETE METABOLIC PANEL WITH GFR
AG Ratio: 1.4 (calc) (ref 1.0–2.5)
ALT: 39 U/L — ABNORMAL HIGH (ref 6–29)
AST: 41 U/L — ABNORMAL HIGH (ref 10–35)
Albumin: 4.2 g/dL (ref 3.6–5.1)
Alkaline phosphatase (APISO): 59 U/L (ref 37–153)
BUN: 19 mg/dL (ref 7–25)
CO2: 24 mmol/L (ref 20–32)
Calcium: 9.9 mg/dL (ref 8.6–10.4)
Chloride: 102 mmol/L (ref 98–110)
Creat: 0.95 mg/dL (ref 0.50–0.99)
GFR, Est African American: 75 mL/min/{1.73_m2} (ref >=60)
GFR, Est Non African American: 65 mL/min/{1.73_m2} (ref >=60)
Globulin: 3.1 g/dL (calc) (ref 1.9–3.7)
Glucose, Bld: 86 mg/dL (ref 65–99)
Potassium: 4.6 mmol/L (ref 3.5–5.3)
Sodium: 138 mmol/L (ref 135–146)
Total Bilirubin: 0.6 mg/dL (ref 0.2–1.2)
Total Protein: 7.3 g/dL (ref 6.1–8.1)

## 2018-09-23 LAB — URINE CULTURE
MICRO NUMBER:: 894151
SPECIMEN QUALITY:: ADEQUATE

## 2018-09-23 LAB — LIPID PANEL
Cholesterol: 200 mg/dL — ABNORMAL HIGH (ref <200)
HDL: 37 mg/dL — ABNORMAL LOW (ref >=50)
LDL Cholesterol (Calc): 124 mg/dL (calc) — ABNORMAL HIGH
Non-HDL Cholesterol (Calc): 163 mg/dL (calc) — ABNORMAL HIGH (ref <130)
Total CHOL/HDL Ratio: 5.4 (calc) — ABNORMAL HIGH (ref <5.0)
Triglycerides: 255 mg/dL — ABNORMAL HIGH (ref <150)

## 2018-09-23 LAB — CBC WITH DIFFERENTIAL/PLATELET
Absolute Monocytes: 635 cells/uL (ref 200–950)
Basophils Absolute: 37 cells/uL (ref 0–200)
Basophils Relative: 0.5 %
Eosinophils Absolute: 248 cells/uL (ref 15–500)
Eosinophils Relative: 3.4 %
HCT: 41.7 % (ref 35.0–45.0)
Hemoglobin: 13.9 g/dL (ref 11.7–15.5)
Lymphs Abs: 1986 cells/uL (ref 850–3900)
MCH: 30.4 pg (ref 27.0–33.0)
MCHC: 33.3 g/dL (ref 32.0–36.0)
MCV: 91.2 fL (ref 80.0–100.0)
MPV: 10.5 fL (ref 7.5–12.5)
Monocytes Relative: 8.7 %
Neutro Abs: 4395 cells/uL (ref 1500–7800)
Neutrophils Relative %: 60.2 %
Platelets: 237 10*3/uL (ref 140–400)
RBC: 4.57 10*6/uL (ref 3.80–5.10)
RDW: 13.6 % (ref 11.0–15.0)
Total Lymphocyte: 27.2 %
WBC: 7.3 10*3/uL (ref 3.8–10.8)

## 2018-09-23 LAB — HEMOGLOBIN A1C
Hgb A1c MFr Bld: 5.6 % of total Hgb (ref <5.7)
Mean Plasma Glucose: 114 (calc)
eAG (mmol/L): 6.3 (calc)

## 2018-09-23 LAB — VITAMIN D 25 HYDROXY (VIT D DEFICIENCY, FRACTURES): Vit D, 25-Hydroxy: 34 ng/mL (ref 30–100)

## 2018-09-23 LAB — MAGNESIUM: Magnesium: 2 mg/dL (ref 1.5–2.5)

## 2018-09-23 LAB — INSULIN, RANDOM: Insulin: 38.9 u[IU]/mL — ABNORMAL HIGH

## 2018-09-23 LAB — TSH: TSH: 1.81 mIU/L (ref 0.40–4.50)

## 2018-10-10 ENCOUNTER — Other Ambulatory Visit: Payer: Self-pay | Admitting: Adult Health

## 2018-10-10 DIAGNOSIS — M1990 Unspecified osteoarthritis, unspecified site: Secondary | ICD-10-CM

## 2018-10-19 NOTE — Progress Notes (Signed)
Assessment and Plan: Needs flu shot -     FLU VACCINE MDCK QUAD W/Preservative  Abnormal urination -     Urinalysis, Routine w reflex microscopic -     Urine Culture - has been treated off and on x June, no symptoms, finished cipro, no recent diarrhea, on cranberry, vitamin C and probiotic - if positive, ? Colonization?   Follow up 2-3 months   HPI 63 y.o.female presents for follow up for kidney infection.  She has been following with urology for abnormal urine that has shown ecoli infection however patient has been asymptomatic, she was treated with cipro and put on vitmain C, cranberry and probiotic for prophylaxis.  She denies chills, fever, flank pain, hematuria, lower abdominal pain and urinary incontinence.  She does have frequent diarrhea, this has resolved with diet, diagnosed with IBS-D in the past, follows with [facility]. Last colonoscopy 2016.    BMI is Body mass index is 48.71 kg/m., she is working on diet and exercise. Wt Readings from Last 3 Encounters:  10/23/18 275 lb (124.7 kg)  09/21/18 281 lb (127.5 kg)  08/21/18 291 lb (132 kg)   Wt Readings from Last 10 Encounters:  10/23/18 275 lb (124.7 kg)  09/21/18 281 lb (127.5 kg)  08/21/18 291 lb (132 kg)  07/11/18 294 lb (133.4 kg)  06/08/18 283 lb 9.6 oz (128.6 kg)  08/23/17 285 lb (129.3 kg)  04/28/17 275 lb 3.2 oz (124.8 kg)  03/22/17 275 lb 9.6 oz (125 kg)  12/19/14 255 lb (115.7 kg)  09/27/14 247 lb (112 kg)     Patient Active Problem List   Diagnosis Date Noted  . Gluttony 08/28/2017  . Medication management 03/22/2017  . Vitamin D deficiency 03/22/2017  . Abnormal glucose 03/22/2017  . Osteoarthritis 03/22/2017  . 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 Arthritis of shoulder region, right, degenerative 06/04/2014  . Arthritis of hip 03/19/2014  . Hypertension   . Hyperlipidemia   . GERD (gastroesophageal reflux disease)   . Hiatal hernia   . Morbid obesity ([place])        Current Outpatient Medications (Cardiovascular):  .  rosuvastatin (CRESTOR) 20 MG tablet, Take 1 tablet daily for Cholesterol .  valsartan-hydrochlorothiazide (DIOVAN-HCT) 320-12.5 MG tablet, Take 1 tablet by mouth daily.   Current Outpatient Medications (Analgesics):  .  meloxicam (MOBIC) 15 MG tablet, Take 1/2 to 1 tablet Daily with Food for Pain & Inflammation   Current Outpatient Medications (Other):  .  cholecalciferol (VITAMIN D) 1000 UNITS tablet, Take 1,000 Units by mouth 3 (three) times daily.  Marland Kitchen  CRANBERRY PO, Take 2 capsules by mouth daily. .  cyclobenzaprine (FLEXERIL) 10 MG tablet, Take 1 tablet (10 mg total) by mouth 3 (three) times daily as needed for muscle spasms. .  diphenhydramine-acetaminophen (TYLENOL PM) 25-500 MG TABS, Take 2 tablets by mouth at bedtime as needed. .  Lactobacillus Casei-Folic Acid 123XX123 MG CAPS, 1 pill daily .  magnesium gluconate (MAGONATE) 500 MG tablet, Take 500 mg by mouth 2 (two) times daily. Marland Kitchen  oxybutynin (DITROPAN) 5 MG tablet, Take 1 tablet (5 mg total) by mouth 3 (three) times daily. .  Probiotic Product (PROBIOTIC DAILY PO), Take 1 tablet by mouth daily.  Allergies  Allergen Reactions  . Keflex [Cephalexin] Anaphylaxis and Swelling  . Codeine     REACTION: nausea/vomiting    ROS: all negative except above.   Physical Exam: Filed Weights   10/23/18 0916  Weight: 275 lb (124.7 kg)  BP 140/86   Pulse 72   Temp (!) 97.5 F (36.4 C)   Ht 5\' 3"  (1.6 m)   Wt 275 lb (124.7 kg)   LMP 10/18/2013   SpO2 98%   BMI 48.71 kg/m  General Appearance: Well nourished, in no apparent distress. Eyes: PERRLA, EOMs, conjunctiva no swelling or erythema Sinuses: No Frontal/maxillary tenderness ENT/Mouth: Ext aud canals clear, TMs without erythema, bulging. No erythema, swelling, or exudate on post pharynx.  Tonsils not swollen or erythematous. Hearing normal.  Neck: Supple, thyroid normal.  Respiratory: Respiratory effort normal,  BS equal bilaterally without rales, rhonchi, wheezing or stridor.  Cardio: RRR with no MRGs. Brisk peripheral pulses without edema.  Abdomen: Soft, + BS.  Non tender, no guarding, rebound, hernias, masses. Lymphatics: Non tender without lymphadenopathy.  Musculoskeletal: Full ROM, 5/5 strength, normal gait.  Skin: Warm, dry without rashes, lesions, ecchymosis.  Neuro: Cranial nerves intact. Normal muscle tone, no cerebellar symptoms. Sensation intact.  Psych: Awake and oriented X 3, normal affect, Insight and Judgment appropriate.     Vicie Mutters, PA-C 9:46 AM [facility]  Future Appointments  Date Time Provider [place]  06/12/2019 10:00 AM Vicie Mutters, PA-C [id number] None

## 2018-10-23 ENCOUNTER — Other Ambulatory Visit: Payer: Self-pay

## 2018-10-23 ENCOUNTER — Ambulatory Visit (INDEPENDENT_AMBULATORY_CARE_PROVIDER_SITE_OTHER): Payer: Medicare HMO | Admitting: Physician Assistant

## 2018-10-23 ENCOUNTER — Ambulatory Visit: Payer: Medicare HMO | Admitting: Adult Health

## 2018-10-23 ENCOUNTER — Encounter: Payer: Self-pay | Admitting: Physician Assistant

## 2018-10-23 VITALS — BP 140/86 | HR 72 | Temp 97.5°F | Ht 63.0 in | Wt 275.0 lb

## 2018-10-23 DIAGNOSIS — R39198 Other difficulties with micturition: Secondary | ICD-10-CM

## 2018-10-23 DIAGNOSIS — Z23 Encounter for immunization: Secondary | ICD-10-CM

## 2018-10-23 NOTE — Patient Instructions (Signed)

## 2018-10-25 LAB — URINALYSIS, ROUTINE W REFLEX MICROSCOPIC
Bacteria, UA: NONE SEEN /HPF
Bilirubin Urine: NEGATIVE
Glucose, UA: NEGATIVE
Hyaline Cast: NONE SEEN /LPF
Ketones, ur: NEGATIVE
Nitrite: NEGATIVE
Protein, ur: NEGATIVE
Specific Gravity, Urine: 1.009 (ref 1.001–1.03)
Squamous Epithelial / HPF: NONE SEEN /HPF
pH: 6 (ref 5.0–8.0)

## 2018-10-25 LAB — URINE CULTURE
MICRO NUMBER:: 1004687
Result:: NO GROWTH
SPECIMEN QUALITY:: ADEQUATE

## 2018-11-27 MED ORDER — MONTELUKAST SODIUM 10 MG PO TABS: 10.0000 mg | ORAL_TABLET | Freq: Every day | ORAL | 2 refills | Status: DC

## 2019-01-22 DIAGNOSIS — F331 Major depressive disorder, recurrent, moderate: Secondary | ICD-10-CM | POA: Insufficient documentation

## 2019-01-22 DIAGNOSIS — D649 Anemia, unspecified: Secondary | ICD-10-CM | POA: Insufficient documentation

## 2019-01-22 DIAGNOSIS — F3340 Major depressive disorder, recurrent, in remission, unspecified: Secondary | ICD-10-CM | POA: Insufficient documentation

## 2019-01-22 NOTE — Progress Notes (Signed)
Patient ID: Christine [name], female   DOB: 01-26-1955, 64 y.o.   MRN: [id number]   MEDICARE  Assessment and Plan:  Hiatal hernia Continue PPI/H2 blocker, diet discussed  Morbid obesity ([place]) - follow up 3 months for progress monitoring - increase veggies, decrease carbs - long discussion about weight loss, diet, and exercise  Essential hypertension -     CBC with Differential/Platelet -     COMPLETE METABOLIC PANEL WITH GFR -     TSH -     EKG 12-Lead -     Urinalysis, Routine w reflex microscopic -     Microalbumin / creatinine urine ratio - continue medications, DASH diet, exercise and monitor at home. Call if greater than 130/80.   Hyperlipidemia, unspecified hyperlipidemia type -     Lipid panel -     Magnesium check lipids decrease fatty foods increase activity.   Medication management  Vitamin D deficiency -     VITAMIN D 25 Hydroxy (Vit-D Deficiency, Fractures)  Abnormal glucose -     Hemoglobin A1c Discussed disease progression and risks Discussed diet/exercise, weight management and risk modification  Mixed stress and urge urinary incontinence Weight loss advised, monitor  Depression, major, recurrent, active ([facility]) -     buPROPion (WELLBUTRIN XL) 150 MG 24 hr tablet; Take 1 tablet (150 mg total) by mouth every morning. + depression, will start on wellbutrin, suggest counseling, follow up 3 months  B12 deficiency -     Vitamin B12  Anemia, unspecified type -     Iron,Total/Total Iron Binding Cap -     Ferritin   Discussed med's effects and SE's. Screening labs and tests as requested with regular follow-up as recommended.  Plan:   During the course of the visit the patient was educated and counseled about appropriate screening and preventive services including:    Pneumococcal vaccine   Prevnar 13  Influenza vaccine  Td vaccine  Screening electrocardiogram  Bone densitometry screening  Colorectal cancer screening  Diabetes  screening  Glaucoma screening  Nutrition counseling   Advanced directives: requested   HPI  64 y.o. female  presents for followup medicare visit.   She has had hip and shoulder replacement with Dr. Marlou Sa.   She states she is depressed, not wanting to get out of bed, brother died 11-Apr-2014. She has never been on anything for depression in the past.   Her blood pressure has been controlled at home, today their BP is BP: 126/82.  She does workout. She denies chest pain, shortness of breath, dizziness.   BMI is Body mass index is 48.01 kg/m., she is working on diet and exercise. Wt Readings from Last 10 Encounters:  01/24/19 271 lb (122.9 kg)  10/23/18 275 lb (124.7 kg)  09/21/18 281 lb (127.5 kg)  08/21/18 291 lb (132 kg)  07/11/18 294 lb (133.4 kg)  06/08/18 283 lb 9.6 oz (128.6 kg)  08/23/17 285 lb (129.3 kg)  04/28/17 275 lb 3.2 oz (124.8 kg)  03/22/17 275 lb 9.6 oz (125 kg)  12/19/14 255 lb (115.7 kg)   She is on cholesterol medication, she was started on crestor 20 mg daily, heart disease brother died 86, mother with MI in her 51's. Her cholesterol is not at goal. The cholesterol last visit was:  Lab Results  Component Value Date   CHOL 200 (H) 09/21/2018   HDL 37 (L) 09/21/2018   LDLCALC 124 (H) 09/21/2018   TRIG 255 (H) 09/21/2018   CHOLHDL 5.4 (H) 09/21/2018  .  She has been working on diet and exercise for prediabetes, she is not on bASA, she is not on ACE/ARB and denies foot ulcerations, hyperglycemia, hypoglycemia , increased appetite, nausea, paresthesia of the feet, polydipsia, polyuria, visual disturbances, vomiting and weight loss. Last A1C in the office was:  Lab Results  Component Value Date   HGBA1C 5.6 09/21/2018   Patient is on Vitamin D supplement,10,000.  Lab Results  Component Value Date   VD25OH 34 09/21/2018      Current Medications:  Current Outpatient Medications on File Prior to Visit  Medication Sig  . cholecalciferol (VITAMIN D) 1000 UNITS  tablet Take 1,000 Units by mouth 3 (three) times daily.   . cyclobenzaprine (FLEXERIL) 10 MG tablet Take 1 tablet (10 mg total) by mouth 3 (three) times daily as needed for muscle spasms.  . diphenhydramine-acetaminophen (TYLENOL PM) 25-500 MG TABS Take 2 tablets by mouth at bedtime as needed.  . magnesium gluconate (MAGONATE) 500 MG tablet Take 500 mg by mouth 2 (two) times daily.  . meloxicam (MOBIC) 15 MG tablet Take 1/2 to 1 tablet Daily with Food for Pain & Inflammation  . montelukast (SINGULAIR) 10 MG tablet Take 1 tablet (10 mg total) by mouth at bedtime.  Marland Kitchen oxybutynin (DITROPAN) 5 MG tablet Take 1 tablet (5 mg total) by mouth 3 (three) times daily.  . Probiotic Product (PROBIOTIC DAILY PO) Take 1 tablet by mouth daily.  . rosuvastatin (CRESTOR) 20 MG tablet Take 1 tablet daily for Cholesterol  . valsartan-hydrochlorothiazide (DIOVAN-HCT) 320-12.5 MG tablet Take 1 tablet by mouth daily.  Marland Kitchen CRANBERRY PO Take 2 capsules by mouth daily.   No current facility-administered medications on file prior to visit.    Health Maintenance:   Immunization History  Administered Date(s) Administered  . Influenza Inj Mdck Quad Pf 11/02/2016  . Influenza Inj Mdck Quad With Preservative 10/23/2018  . Influenza, Quadrivalent, Recombinant, Inj, Pf 10/06/2017  . Influenza-Unspecified 11/22/2014, 10/31/2015, 10/06/2017  . PPD Test 06/01/2013  . Pneumococcal Polysaccharide-23 06/08/2018  . Tdap 03/22/2017   TDAP 2019 Influenza 2020 Pneumovax 2020 Prevnar 13 at 89 Shingrix  Colonoscopy 2016 q 5 years- DUE THIS YEAR, will go after July- no family history MGM 01/2017-  June 2020- no family history DEXA 05/2013 PAP 2019 normal, neg HPV q 5 years  Patient Care Team: [name], MD as PCP - General (Internal Medicine) Marlou Sa, Tonna Corner, MD as Consulting Physician (Orthopedic Surgery) Sable Feil, MD as Consulting Physician (Gastroenterology) Allyn Kenner, DO as Consulting  Physician (Obstetrics and Gynecology) Rutherford Guys, MD as Consulting Physician (Ophthalmology) Rolm Bookbinder, MD as Consulting Physician (Dermatology)  Medical History:  Past Medical History:  Diagnosis Date  . Arthritis   . Asthma   . Environmental allergies   . GERD (gastroesophageal reflux disease)   . Headache   . Hiatal hernia    with stricture dilation  . Hyperlipidemia   . Hypertension   . Kidney stones 2008  . Obesity, unspecified   . PONV (postoperative nausea and vomiting)   . UTI (urinary tract infection)     Allergies Allergies  Allergen Reactions  . Keflex [Cephalexin] Anaphylaxis and Swelling  . 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 FAMILY HISTORY Her family history includes Cancer in her father; Heart disease in  her brother and mother; Hyperlipidemia in her mother; Hypertension in her mother; Pulmonary embolism in her brother; Stroke in her mother; Throat cancer in her father. SOCIAL HISTORY She  reports that she has never smoked. She has never used smokeless tobacco. She reports that she does not drink alcohol or use drugs.  Review of Systems: Review of Systems  Constitutional: Negative for chills, fever and malaise/fatigue.  HENT: Negative for congestion, ear discharge, ear pain and sore throat.   Eyes: Negative.   Respiratory: Negative for cough, shortness of breath and wheezing.   Cardiovascular: Negative for chest pain, palpitations, claudication and leg swelling.  Gastrointestinal: Negative for blood in stool, constipation, diarrhea, heartburn, melena, nausea and vomiting.  Genitourinary: Negative for dysuria, flank pain, frequency, hematuria and urgency.  Musculoskeletal: Positive for joint  pain (knee pain). Negative for falls.  Skin: Negative.   Neurological: Negative for dizziness, sensory change, loss of consciousness and headaches.  Psychiatric/Behavioral: Negative for depression, hallucinations, memory loss, substance abuse and suicidal ideas. The patient is not nervous/anxious and does not have insomnia.    MEDICARE WELLNESS OBJECTIVES: Physical activity: Current Exercise Habits: The patient does not participate in regular exercise at present, Exercise limited by: orthopedic condition(s) Cardiac risk factors: Cardiac Risk Factors include: advanced age (>60men, >77 women);dyslipidemia;hypertension;obesity (BMI >30kg/m2);sedentary lifestyle;family history of premature cardiovascular disease Depression/mood screen:   Depression screen [facility] 2/9 01/24/2019  Decreased Interest 0  Down, Depressed, Hopeless 0  PHQ - 2 Score 0  Altered sleeping -  Tired, decreased energy -  Change in appetite -  Feeling bad or failure about yourself  -  Trouble concentrating -  Moving slowly or fidgety/restless -  Suicidal thoughts -  PHQ-9 Score -  Difficult doing work/chores -    ADLs:  In your present state of health, do you have any difficulty performing the following activities: 01/24/2019 09/21/2018  Hearing? N N  Vision? N N  Difficulty concentrating or making decisions? N N  Walking or climbing stairs? Y N  Comment due to her knee pain, going to see Dr. Marlou Sa -  Dressing or bathing? N N  Doing errands, shopping? N N  Some recent data might be hidden     Cognitive Testing  Alert? Yes  Normal Appearance?Yes  Oriented to person? Yes  Place? Yes   Time? Yes  Recall of three objects?  Yes  Can perform simple calculations? Yes  Displays appropriate judgment?Yes  Can read the correct time from a watch face?Yes  EOL planning: Does Patient Have a Medical Advance Directive?: Yes Does patient want to make changes to medical advance directive?: Yes (MAU/Ambulatory/Procedural Areas -  Information given) Would patient like information on creating a medical advance directive?: Yes (MAU/Ambulatory/Procedural Areas - Information given)   Physical Exam: Estimated body mass index is 48.01 kg/m as calculated from the following:   Height as of this encounter: 5\' 3"  (1.6 m).   Weight as of this encounter: 271 lb (122.9 kg). BP 126/82   Pulse 63   Temp (!) 97.3 F (36.3 C)   Ht 5\' 3"  (1.6 m)   Wt 271 lb (122.9 kg)   LMP 10/18/2013   SpO2 96%   BMI 48.01 kg/m   General Appearance: Well nourished well developed, in no apparent distress.  Eyes: PERRLA, EOMs, conjunctiva no swelling or erythema ENT/Mouth: Ear canals normal without obstruction, swelling, erythema, or discharge.  TMs normal bilaterally with no erythema, bulging, retraction, or loss of landmark. Mouth and nose not examined- patient  wearing a facemask Neck: Supple, thyroid normal, No bruits.  No cervical adenopathy Respiratory: Respiratory effort normal, Breath sounds clear A&P without wheeze, rhonchi, rales.   Cardio: RRR without murmurs, rubs or gallops. Brisk peripheral pulses without edema.  Chest: symmetric, with normal excursions Breasts:defer Abdomen: Morbidly obese,  Soft, nontender, no guarding, rebound, hernias, masses, or organomegaly.  Lymphatics: Non tender without lymphadenopathy.  Genitourinary: defer Musculoskeletal: Full ROM, 4/5 strength, and slow antalgic gait.  Skin: Warm, dry without rashes, lesions, ecchymosis. Neuro: Awake and oriented X 3, Cranial nerves intact, reflexes equal bilaterally. Normal muscle tone, no cerebellar symptoms. Sensation intact.  Psych:  normal affect, Insight and Judgment appropriate.    Medicare Attestation I have personally reviewed: The patient's medical and social history Their use of alcohol, tobacco or illicit drugs Their current medications and supplements The patient's functional ability including ADLs,fall risks, home safety risks, cognitive, and  hearing and visual impairment Diet and physical activities Evidence for depression or mood disorders  The patient's weight, height, BMI, and visual acuity have been recorded in the chart.  I have made referrals, counseling, and provided education to the patient based on review of the above and I have provided the patient with a written personalized care plan for preventive services.    Vicie Mutters 9:52 AM [facility]

## 2019-01-24 ENCOUNTER — Encounter: Payer: Self-pay | Admitting: Physician Assistant

## 2019-01-24 ENCOUNTER — Ambulatory Visit (INDEPENDENT_AMBULATORY_CARE_PROVIDER_SITE_OTHER): Payer: Medicare HMO | Admitting: Physician Assistant

## 2019-01-24 ENCOUNTER — Other Ambulatory Visit: Payer: Self-pay

## 2019-01-24 VITALS — BP 126/82 | HR 63 | Temp 97.3°F | Ht 63.0 in | Wt 271.0 lb

## 2019-01-24 DIAGNOSIS — Z0001 Encounter for general adult medical examination with abnormal findings: Secondary | ICD-10-CM | POA: Diagnosis not present

## 2019-01-24 DIAGNOSIS — F331 Major depressive disorder, recurrent, moderate: Secondary | ICD-10-CM

## 2019-01-24 DIAGNOSIS — Z79899 Other long term (current) drug therapy: Secondary | ICD-10-CM

## 2019-01-24 DIAGNOSIS — K449 Diaphragmatic hernia without obstruction or gangrene: Secondary | ICD-10-CM

## 2019-01-24 DIAGNOSIS — R69 Illness, unspecified: Secondary | ICD-10-CM | POA: Diagnosis not present

## 2019-01-24 DIAGNOSIS — E785 Hyperlipidemia, unspecified: Secondary | ICD-10-CM | POA: Diagnosis not present

## 2019-01-24 DIAGNOSIS — M1610 Unilateral primary osteoarthritis, unspecified hip: Secondary | ICD-10-CM

## 2019-01-24 DIAGNOSIS — R6889 Other general symptoms and signs: Secondary | ICD-10-CM | POA: Diagnosis not present

## 2019-01-24 DIAGNOSIS — N3946 Mixed incontinence: Secondary | ICD-10-CM | POA: Diagnosis not present

## 2019-01-24 DIAGNOSIS — I10 Essential (primary) hypertension: Secondary | ICD-10-CM

## 2019-01-24 DIAGNOSIS — E559 Vitamin D deficiency, unspecified: Secondary | ICD-10-CM

## 2019-01-24 DIAGNOSIS — Z0000 Encounter for general adult medical examination without abnormal findings: Secondary | ICD-10-CM

## 2019-01-24 DIAGNOSIS — R8271 Bacteriuria: Secondary | ICD-10-CM

## 2019-01-24 DIAGNOSIS — R7309 Other abnormal glucose: Secondary | ICD-10-CM | POA: Diagnosis not present

## 2019-01-24 DIAGNOSIS — K219 Gastro-esophageal reflux disease without esophagitis: Secondary | ICD-10-CM

## 2019-01-24 DIAGNOSIS — D649 Anemia, unspecified: Secondary | ICD-10-CM

## 2019-01-24 NOTE — Patient Instructions (Addendum)
Get your colonoscopy after July Colon cancer is 3rd most diagnosed cancer and 2nd leading cause of death in both men and women 62 years of age and older despite being one of the most preventable and treatable cancers if found early.  4 of out 5 people diagnosed with colon cancer have NO prior family history.  When caught EARLY 90% of colon cancer is curable.    Ask insurance and pharmacy about shingrix - new vaccine   Can go to [email or web address] for more information  Shingrix Vaccination  Two vaccines are licensed and recommended to prevent shingles in the [place] Zoster vaccine live (ZVL, Zostavax) has been in use since 2006. Recombinant zoster vaccine (RZV, Shingrix), has been in use since 2017 and is recommended by [employer] as the preferred shingles vaccine.  What Everyone Should Know about Shingles Vaccine (Shingrix) One of the Recommended Vaccines by Disease Shingles vaccination is the only way to protect against shingles and postherpetic neuralgia (PHN), the most common complication from shingles. CDC recommends that healthy adults 50 years and older get two doses of the shingles vaccine called Shingrix (recombinant zoster vaccine), separated by 2 to 6 months, to prevent shingles and the complications from the disease. Your doctor or pharmacist can give you Shingrix as a shot in your upper arm. Shingrix provides strong protection against shingles and PHN. Two doses of Shingrix is more than 90% effective at preventing shingles and PHN. Protection stays above 85% for at least the first four years after you get vaccinated. Shingrix is the preferred vaccine, over Zostavax (zoster vaccine live), a shingles vaccine in use since 2006. Zostavax may still be used to prevent shingles in healthy adults 60 years and older. For example, you could use Zostavax if a person is allergic to Shingrix, prefers Zostavax, or requests immediate vaccination and Shingrix  is unavailable. Who Should Get Shingrix? Healthy adults 62 years and older should get two doses of Shingrix, separated by 2 to 6 months. You should get Shingrix even if in the past you . had shingles  . received Zostavax  . are not sure if you had chickenpox There is no maximum age for getting Shingrix. If you had shingles in the past, you can get Shingrix to help prevent future occurrences of the disease. There is no specific length of time that you need to wait after having shingles before you can receive Shingrix, but generally you should make sure the shingles rash has gone away before getting vaccinated. You can get Shingrix whether or not you remember having had chickenpox in the past. Studies show that more than 99% of Americans 40 years and older have had chickenpox, even if they don't remember having the disease. Chickenpox and shingles are related because they are caused by the same virus (varicella zoster virus). After a person recovers from chickenpox, the virus stays dormant (inactive) in the body. It can reactivate years later and cause shingles. If you had Zostavax in the recent past, you should wait at least eight weeks before getting Shingrix. Talk to your healthcare provider to determine the best time to get Shingrix. Shingrix is available in [facility] and pharmacies. To find doctor's offices or pharmacies near you that offer the vaccine, visit [facility]. If you have questions about Shingrix, talk with your healthcare provider. Vaccine for Those 62 Years and Older  Shingrix reduces the risk of shingles and PHN by more than 90% in people 62 and older. CDC recommends the vaccine for healthy adults  62 and older.  Who Should Not Get Shingrix? You should not get Shingrix if you: . have ever had a severe allergic reaction to any component of the vaccine or after a dose of Shingrix  . tested negative for immunity to varicella zoster virus. If you test  negative, you should get chickenpox vaccine.  . currently have shingles  . currently are pregnant or breastfeeding. Women who are pregnant or breastfeeding should wait to get Shingrix.  Marland Kitchen receive specific antiviral drugs (acyclovir, famciclovir, or valacyclovir) 24 hours before vaccination (avoid use of these antiviral drugs for 14 days after vaccination)- zoster vaccine live only If you have a minor acute (starts suddenly) illness, such as a cold, you may get Shingrix. But if you have a moderate or severe acute illness, you should usually wait until you recover before getting the vaccine. This includes anyone with a temperature of 101.23F or higher. The side effects of the Shingrix are temporary, and usually last 2 to 3 days. While you may experience pain for a few days after getting Shingrix, the pain will be less severe than having shingles and the complications from the disease. How Well Does Shingrix Work? Two doses of Shingrix provides strong protection against shingles and postherpetic neuralgia (PHN), the most common complication of shingles. . In adults 62 to 62 years old who got two doses, Shingrix was 97% effective in preventing shingles; among adults 70 years and older, Shingrix was 91% effective.  . In adults 62 to 62 years old who got two doses, Shingrix was 91% effective in preventing PHN; among adults 70 years and older, Shingrix was 89% effective. Shingrix protection remained high (more than 85%) in people 62 years and older throughout the four years following vaccination. Since your risk of shingles and PHN increases as you get older, it is important to have strong protection against shingles in your older years. Top of Page  What Are the Possible Side Effects of Shingrix? Studies show that Shingrix is safe. The vaccine helps your body create a strong defense against shingles. As a result, you are likely to have temporary side effects from getting the shots. The side effects may affect  your ability to do normal daily activities for 2 to 3 days. Most people got a sore arm with mild or moderate pain after getting Shingrix, and some also had redness and swelling where they got the shot. Some people felt tired, had muscle pain, a headache, shivering, fever, stomach pain, or nausea. About 1 out of 6 people who got Shingrix experienced side effects that prevented them from doing regular activities. Symptoms went away on their own in about 2 to 3 days. Side effects were more common in younger people. You might have a reaction to the first or second dose of Shingrix, or both doses. If you experience side effects, you may choose to take over-the-counter pain medicine such as ibuprofen or acetaminophen. If you experience side effects from Shingrix, you should report them to the Vaccine Adverse Event Reporting System (VAERS). Your doctor might file this report, or you can do it yourself through the VAERS websiteExternal, or by calling [phone]. If you have any questions about side effects from Shingrix, talk with your doctor. The shingles vaccine does not contain thimerosal (a preservative containing mercury). Top of Page  When Should I See a Doctor Because of the Side Effects I Experience From Shingrix? In clinical trials, Shingrix was not associated with serious adverse events. In fact, serious side effects  from vaccines are extremely rare. For example, for every 1 million doses of a vaccine given, only one or two people may have a severe allergic reaction. Signs of an allergic reaction happen within minutes or hours after vaccination and include hives, swelling of the face and throat, difficulty breathing, a fast heartbeat, dizziness, or weakness. If you experience these or any other life-threatening symptoms, see a doctor right away. Shingrix causes a strong response in your immune system, so it may produce short-term side effects more intense than you are used to from other vaccines.  These side effects can be uncomfortable, but they are expected and usually go away on their own in 2 or 3 days. Top of Page  How Can I Pay For Shingrix? There are several ways shingles vaccine may be paid for: Medicare . Medicare Part D plans cover the shingles vaccine, but there may be a cost to you depending on your plan. There may be a copay for the vaccine, or you may need to pay in full then get reimbursed for a certain amount.  . Medicare Part B does not cover the shingles vaccine. Medicaid . Medicaid may or may not cover the vaccine. Contact your insurer to find out. Private health insurance . Many private health insurance plans will cover the vaccine, but there may be a cost to you depending on your plan. Contact your insurer to find out. Vaccine assistance programs . Some pharmaceutical companies provide vaccines to eligible adults who cannot afford them. You may want to check with the vaccine manufacturer, [employer], about Shingrix. If you do not currently have health insurance, learn more about affordable health coverage optionsExternal. To find doctor's offices or pharmacies near you that offer the vaccine, visit [place]  Being a woman you may not have the typical symptoms of a heart attack.  You may not have any pain OR you may have atypical pain such as jaw pain, upper back pain, arm pain, "my bra feels to tight" and you will often have symptoms with it like below.  Symptoms for a heart attack will likely occur when you exert your self or exercise and include: Shortness of breath Sweating Nausea Dizziness Fast or irregular heart beats Fatigue   It makes me feel better if my patients get their heart rate up with exercise once or twice a week and pay close attention to your body. If there is ANY change in your exercise capacity or if you have symptoms above, please STOP and call 911 or call to come to the office.    Here is some information to help you keep your heart healthy: Move it! - Aim for 30 mins of activity every day. Take it slowly at first. Talk to [place] before starting any new exercise program.   Lose it.  -Body Mass Index (BMI) can indicate if you need to lose weight. A healthy range is 18.5-24.9. For a BMI calculator, go to [email or web address]  Waist Management -Excess abdominal fat is a risk factor for heart disease, diabetes, asthma, stroke and more. Ideal waist circumference is less than 35" for women and less than 40" for men.   Eat Right -focus on fruits, vegetables, whole grains, and meals you make yourself. Avoid foods with trans fat and high sugar/sodium content.   Snooze or Snore? - Loud snoring can be a sign of sleep apnea, a significant risk factor for high blood pressure, heart attach, stroke, and heart arrhythmias.  Kick  the habit -Quit Smoking! Avoid second hand smoke. A single cigarette raises your blood pressure for 20 mins and increases the risk of heart attack and stroke for the next 24 hours.   Are Aspirin and Supplements right for you? -Add ENTERIC COATED low dose 81 mg Aspirin daily OR can do every other day if you have easy bruising to protect your heart and head. As well as to reduce risk of Colon Cancer by 20 %, Skin Cancer by 26 % , Melanoma by 46% and Pancreatic cancer by 60%  Say "No to Stress -There may be little you can do about problems that cause stress. However, techniques such as long walks, meditation, and exercise can help you manage it.   Start Now! - Make changes one at a time and set reasonable goals to increase your likelihood of success.    Your ears and sinuses are connected by the eustachian tube. When your sinuses are inflamed, this can close off the tube and cause fluid to collect in your middle ear. This can then cause dizziness, popping, clicking, ringing, and echoing in your ears. This is often NOT an infection and does NOT require antibiotics,  it is caused by inflammation so the treatments help the inflammation. This can take a long time to get better so please be patient.  Here are things you can do to help with this: - Try the Flonase or Nasonex. Remember to spray each nostril twice towards the outer part of your eye.  Do not sniff but instead pinch your nose and tilt your head back to help the medicine get into your sinuses.  The best time to do this is at bedtime.Stop if you get blurred vision or nose bleeds.  -While drinking fluids, pinch and hold nose close and swallow, to help open eustachian tubes to drain fluid behind ear drums. -Please pick one of the over the counter allergy medications below and take it once daily for allergies.  It will also help with fluid behind ear drums. Claritin or loratadine cheapest but likely the weakest  Zyrtec or certizine at night because it can make you sleepy The strongest is allegra or fexafinadine  Cheapest at [facility], [facility], [facility] -can use decongestant over the counter, please do not use if you have high blood pressure or certain heart conditions.   if worsening HA, changes vision/speech, imbalance, weakness go to the ER

## 2019-01-25 ENCOUNTER — Other Ambulatory Visit: Payer: Self-pay | Admitting: Physician Assistant

## 2019-01-25 MED ORDER — FLUCONAZOLE 150 MG PO TABS: 150.0000 mg | ORAL_TABLET | Freq: Every day | ORAL | 3 refills | Status: DC

## 2019-01-25 MED ORDER — CIPROFLOXACIN HCL 500 MG PO TABS: 500.0000 mg | ORAL_TABLET | Freq: Two times a day (BID) | ORAL | 0 refills | Status: AC

## 2019-01-26 LAB — HEMOGLOBIN A1C
Hgb A1c MFr Bld: 5.6 % of total Hgb (ref <5.7)
Mean Plasma Glucose: 114 (calc)
eAG (mmol/L): 6.3 (calc)

## 2019-01-26 LAB — CBC WITH DIFFERENTIAL/PLATELET
Absolute Monocytes: 653 cells/uL (ref 200–950)
Basophils Absolute: 61 cells/uL (ref 0–200)
Basophils Relative: 0.9 %
Eosinophils Absolute: 381 cells/uL (ref 15–500)
Eosinophils Relative: 5.6 %
HCT: 38.5 % (ref 35.0–45.0)
Hemoglobin: 13.2 g/dL (ref 11.7–15.5)
Lymphs Abs: 2210 cells/uL (ref 850–3900)
MCH: 31.3 pg (ref 27.0–33.0)
MCHC: 34.3 g/dL (ref 32.0–36.0)
MCV: 91.2 fL (ref 80.0–100.0)
MPV: 10.5 fL (ref 7.5–12.5)
Monocytes Relative: 9.6 %
Neutro Abs: 3495 cells/uL (ref 1500–7800)
Neutrophils Relative %: 51.4 %
Platelets: 256 10*3/uL (ref 140–400)
RBC: 4.22 10*6/uL (ref 3.80–5.10)
RDW: 12.8 % (ref 11.0–15.0)
Total Lymphocyte: 32.5 %
WBC: 6.8 10*3/uL (ref 3.8–10.8)

## 2019-01-26 LAB — COMPLETE METABOLIC PANEL WITH GFR
AG Ratio: 1.4 (calc) (ref 1.0–2.5)
ALT: 49 U/L — ABNORMAL HIGH (ref 6–29)
AST: 43 U/L — ABNORMAL HIGH (ref 10–35)
Albumin: 4.2 g/dL (ref 3.6–5.1)
Alkaline phosphatase (APISO): 68 U/L (ref 37–153)
BUN/Creatinine Ratio: 27 (calc) — ABNORMAL HIGH (ref 6–22)
BUN: 26 mg/dL — ABNORMAL HIGH (ref 7–25)
CO2: 23 mmol/L (ref 20–32)
Calcium: 10 mg/dL (ref 8.6–10.4)
Chloride: 105 mmol/L (ref 98–110)
Creat: 0.95 mg/dL (ref 0.50–0.99)
GFR, Est African American: 75 mL/min/{1.73_m2} (ref >=60)
GFR, Est Non African American: 65 mL/min/{1.73_m2} (ref >=60)
Globulin: 3.1 g/dL (calc) (ref 1.9–3.7)
Glucose, Bld: 96 mg/dL (ref 65–99)
Potassium: 4.4 mmol/L (ref 3.5–5.3)
Sodium: 138 mmol/L (ref 135–146)
Total Bilirubin: 0.5 mg/dL (ref 0.2–1.2)
Total Protein: 7.3 g/dL (ref 6.1–8.1)

## 2019-01-26 LAB — URINALYSIS, ROUTINE W REFLEX MICROSCOPIC
Bilirubin Urine: NEGATIVE
Glucose, UA: NEGATIVE
Hyaline Cast: NONE SEEN /LPF
Ketones, ur: NEGATIVE
Nitrite: POSITIVE — AB
Specific Gravity, Urine: 1.014 (ref 1.001–1.03)
WBC, UA: >=60 /HPF — AB (ref 0–5)
pH: 5.5 (ref 5.0–8.0)

## 2019-01-26 LAB — LIPID PANEL
Cholesterol: 195 mg/dL (ref <200)
HDL: 40 mg/dL — ABNORMAL LOW (ref >=50)
LDL Cholesterol (Calc): 126 mg/dL (calc) — ABNORMAL HIGH
Non-HDL Cholesterol (Calc): 155 mg/dL (calc) — ABNORMAL HIGH (ref <130)
Total CHOL/HDL Ratio: 4.9 (calc) (ref <5.0)
Triglycerides: 174 mg/dL — ABNORMAL HIGH (ref <150)

## 2019-01-26 LAB — URINE CULTURE
MICRO NUMBER:: 10062576
SPECIMEN QUALITY:: ADEQUATE

## 2019-01-26 LAB — MAGNESIUM: Magnesium: 1.9 mg/dL (ref 1.5–2.5)

## 2019-01-26 LAB — VITAMIN D 25 HYDROXY (VIT D DEFICIENCY, FRACTURES): Vit D, 25-Hydroxy: 53 ng/mL (ref 30–100)

## 2019-01-26 LAB — TSH: TSH: 2.03 mIU/L (ref 0.40–4.50)

## 2019-01-28 ENCOUNTER — Other Ambulatory Visit: Payer: Self-pay | Admitting: Internal Medicine

## 2019-01-28 DIAGNOSIS — E782 Mixed hyperlipidemia: Secondary | ICD-10-CM

## 2019-01-28 DIAGNOSIS — I10 Essential (primary) hypertension: Secondary | ICD-10-CM

## 2019-01-28 MED ORDER — VALSARTAN-HYDROCHLOROTHIAZIDE 320-12.5 MG PO TABS: ORAL_TABLET | ORAL | 3 refills | Status: DC

## 2019-01-28 MED ORDER — ROSUVASTATIN CALCIUM 20 MG PO TABS: ORAL_TABLET | ORAL | 1 refills | Status: DC

## 2019-02-27 ENCOUNTER — Other Ambulatory Visit: Payer: Self-pay

## 2019-02-27 ENCOUNTER — Other Ambulatory Visit: Payer: Medicare HMO

## 2019-02-27 DIAGNOSIS — R8271 Bacteriuria: Secondary | ICD-10-CM

## 2019-02-28 LAB — URINALYSIS, ROUTINE W REFLEX MICROSCOPIC
Bacteria, UA: NONE SEEN /HPF
Bilirubin Urine: NEGATIVE
Glucose, UA: NEGATIVE
Hyaline Cast: NONE SEEN /LPF
Ketones, ur: NEGATIVE
Nitrite: NEGATIVE
Specific Gravity, Urine: 1.018 (ref 1.001–1.03)
WBC, UA: >=60 /HPF — AB (ref 0–5)
pH: 5.5 (ref 5.0–8.0)

## 2019-02-28 LAB — URINE CULTURE
MICRO NUMBER:: 10179200
SPECIMEN QUALITY:: ADEQUATE

## 2019-03-05 ENCOUNTER — Other Ambulatory Visit: Payer: Self-pay | Admitting: Physician Assistant

## 2019-03-05 DIAGNOSIS — Z1231 Encounter for screening mammogram for malignant neoplasm of breast: Secondary | ICD-10-CM

## 2019-04-30 ENCOUNTER — Ambulatory Visit: Payer: Medicare HMO | Attending: Internal Medicine

## 2019-04-30 DIAGNOSIS — Z23 Encounter for immunization: Secondary | ICD-10-CM

## 2019-04-30 NOTE — Progress Notes (Signed)
   Covid-19 Vaccination Clinic  Name:  Christine [name]    MRN: [id number] DOB: [birth year]  04/30/2019  [name] was observed post Covid-19 immunization for 30 minutes based on pre-vaccination screening without incident. She was provided with Vaccine Information Sheet and instruction to access the V-Safe system.   [name] was instructed to call 911 with any severe reactions post vaccine: Marland Kitchen Difficulty breathing  . Swelling of face and throat  . A fast heartbeat  . A bad rash all over body  . Dizziness and weakness   Immunizations Administered    Name Date Dose VIS Date Route   Pfizer COVID-19 Vaccine 04/30/2019 12:46 PM 0.3 mL 02/28/2018 Intramuscular   Manufacturer: [place]   Lot: [id number]   [place]: [id number]

## 2019-05-01 ENCOUNTER — Ambulatory Visit: Payer: Medicare HMO | Admitting: Physician Assistant

## 2019-05-07 DIAGNOSIS — H2513 Age-related nuclear cataract, bilateral: Secondary | ICD-10-CM | POA: Diagnosis not present

## 2019-05-07 DIAGNOSIS — H04123 Dry eye syndrome of bilateral lacrimal glands: Secondary | ICD-10-CM | POA: Diagnosis not present

## 2019-05-07 DIAGNOSIS — H524 Presbyopia: Secondary | ICD-10-CM | POA: Diagnosis not present

## 2019-05-21 ENCOUNTER — Ambulatory Visit: Payer: Medicare HMO | Attending: Internal Medicine

## 2019-05-21 DIAGNOSIS — Z23 Encounter for immunization: Secondary | ICD-10-CM

## 2019-05-21 NOTE — Progress Notes (Signed)
   Covid-19 Vaccination Clinic  Name:  Christine [name]    MRN: [id number] DOB: [birth year]  05/21/2019  [name] was observed post Covid-19 immunization for 30 minutes based on pre-vaccination screening without incident. She was provided with Vaccine Information Sheet and instruction to access the V-Safe system.   [name] was instructed to call 911 with any severe reactions post vaccine: Marland Kitchen Difficulty breathing  . Swelling of face and throat  . A fast heartbeat  . A bad rash all over body  . Dizziness and weakness   Immunizations Administered    Name Date Dose VIS Date Route   Pfizer COVID-19 Vaccine 05/21/2019  1:03 PM 0.3 mL 02/28/2018 Intramuscular   Manufacturer: [place]   Lot: [id number]   [place]: [facility]  Name:  Christine [name]    MRN: [id number] DOB: [birth year]  05/21/2019  [name] was observed post Covid-19 immunization for 15 minutes without incident. She was provided with Vaccine Information Sheet and instruction to access the V-Safe system.   [name] was instructed to call 911 with any severe reactions post vaccine: Marland Kitchen Difficulty breathing  . Swelling of face and throat  . A fast heartbeat  . A bad rash all over body  . Dizziness and weakness   Immunizations Administered    Name Date Dose VIS Date Route   Pfizer COVID-19 Vaccine 05/21/2019  1:03 PM 0.3 mL 02/28/2018 Intramuscular   Manufacturer: [place]   Lot: [id number]   [place]: [id number]

## 2019-06-12 ENCOUNTER — Encounter: Payer: Medicare HMO | Admitting: Physician Assistant

## 2019-06-13 NOTE — Progress Notes (Signed)
Patient ID: Christine [name], female   DOB: 1955/09/08, 64 y.o.   MRN: [id number]  CPE  Assessment and Plan:  Hiatal hernia Continue PPI/H2 blocker, diet discussed  Morbid obesity ([place]) - follow up 3 months for progress monitoring - increase veggies, decrease carbs - long discussion about weight loss, diet, and exercise  Essential hypertension -     CBC with Differential/Platelet -     COMPLETE METABOLIC PANEL WITH GFR -     TSH -     EKG 12-Lead -Aorta Scan -     Urinalysis, Routine w reflex microscopic -     Microalbumin / creatinine urine ratio - continue medications, DASH diet, exercise and monitor at home. Call if greater than 130/80.   Hyperlipidemia, unspecified hyperlipidemia type -     Lipid panel -     Magnesium check lipids decrease fatty foods increase activity.   Medication management  Vitamin D deficiency -     VITAMIN D 25 Hydroxy (Vit-D Deficiency, Fractures)  Abnormal glucose -     Hemoglobin A1c Discussed disease progression and risks Discussed diet/exercise, weight management and risk modification  Mixed stress and urge urinary incontinence Weight loss advised, monitor  Depression, major, recurrent, active (HCC) + depression, patient is feeling better but not on medications Will monitor closely, follow up 3 months Suggested counseling No SI/HI  Urinary frequency -     Urine Culture  Discussed med's effects and SE's. Screening labs and tests as requested with regular follow-up as recommended.   HPI  64 y.o. female  presents for followup CPE and obesity, HTN, chol.   She has had hip and shoulder replacement with Dr. Marlou Sa.   She states she is depressed, not wanting to get out of bed, brother died 04-02-14. She has never been on anything for depression in the past. She had diarrhea with wellbutrin. She had to put her dog down, her cousin was suicidal.  She states she is doing better, some motivation to get out of bed, did not find counseling. She  is on singulair.  Her blood pressure has been controlled at home, today their BP is BP: 122/80.  She does workout. She denies chest pain, shortness of breath, dizziness.   BMI is Body mass index is 48.36 kg/m., she is working on diet and exercise. Wt Readings from Last 10 Encounters:  06/14/19 273 lb (123.8 kg)  01/24/19 271 lb (122.9 kg)  10/23/18 275 lb (124.7 kg)  09/21/18 281 lb (127.5 kg)  08/21/18 291 lb (132 kg)  07/11/18 294 lb (133.4 kg)  06/08/18 283 lb 9.6 oz (128.6 kg)  08/23/17 285 lb (129.3 kg)  04/28/17 275 lb 3.2 oz (124.8 kg)  03/22/17 275 lb 9.6 oz (125 kg)   She is on cholesterol medication, she was started on crestor 20 mg daily, heart disease brother died 53, mother with MI in her 20's. Her cholesterol is not at goal. The cholesterol last visit was:  Lab Results  Component Value Date   CHOL 195 01/24/2019   HDL 40 (L) 01/24/2019   LDLCALC 126 (H) 01/24/2019   TRIG 174 (H) 01/24/2019   CHOLHDL 4.9 01/24/2019  . She has been working on diet and exercise for prediabetes, she is not on bASA, she is not on ACE/ARB and denies foot ulcerations, hyperglycemia, hypoglycemia , increased appetite, nausea, paresthesia of the feet, polydipsia, polyuria, visual disturbances, vomiting and weight loss. Last A1C in the office was:  Lab Results  Component Value Date  HGBA1C 5.6 01/24/2019   Patient is on Vitamin D supplement,10,000.  Lab Results  Component Value Date   VD25OH 53 01/24/2019      Current Medications:    Current Outpatient Medications (Cardiovascular):  .  rosuvastatin (CRESTOR) 20 MG tablet, Take 1 tablet Daily for Cholesterol .  valsartan-hydrochlorothiazide (DIOVAN-HCT) 320-12.5 MG tablet, Take 1 tablet Daily for BP  Current Outpatient Medications (Respiratory):  .  montelukast (SINGULAIR) 10 MG tablet, Take 1 tablet (10 mg total) by mouth at bedtime.  Current Outpatient Medications (Analgesics):  .  meloxicam (MOBIC) 15 MG tablet, Take 1/2 to 1  tablet Daily with Food for Pain & Inflammation   Current Outpatient Medications (Other):  .  cholecalciferol (VITAMIN D) 1000 UNITS tablet, Take 1,000 Units by mouth 3 (three) times daily.  .  cyclobenzaprine (FLEXERIL) 10 MG tablet, Take 1 tablet (10 mg total) by mouth 3 (three) times daily as needed for muscle spasms. .  diphenhydramine-acetaminophen (TYLENOL PM) 25-500 MG TABS, Take 2 tablets by mouth at bedtime as needed. .  magnesium gluconate (MAGONATE) 500 MG tablet, Take 500 mg by mouth 2 (two) times daily. Marland Kitchen  oxybutynin (DITROPAN) 5 MG tablet, Take 1 tablet (5 mg total) by mouth 3 (three) times daily. .  Probiotic Product (PROBIOTIC DAILY PO), Take 1 tablet by mouth daily.  Health Maintenance:   Immunization History  Administered Date(s) Administered  . Influenza Inj Mdck Quad Pf 11/02/2016  . Influenza Inj Mdck Quad With Preservative 10/23/2018  . Influenza, Quadrivalent, Recombinant, Inj, Pf 10/06/2017  . Influenza-Unspecified 11/22/2014, 10/31/2015, 10/06/2017  . PFIZER SARS-COV-2 Vaccination 04/30/2019, 05/21/2019  . PPD Test 06/01/2013  . Pneumococcal Polysaccharide-23 06/08/2018  . Tdap 03/22/2017   TDAP 2019 Influenza 2020 Pneumovax 2020 Prevnar 13 at 61 Shingrix check   Colonoscopy 2016 q 5 years- DUE THIS YEAR, will go after July- no family history- Dr. Hilarie Fredricks[facility]  June 2020- no family history- getting in July due to [place] 05/2013 PAP 2019 normal, neg HPV q 5 years  Patient Care Team: [name], MD as PCP - General (Internal Medicine) Marlou Sa, Tonna Corner, MD as Consulting Physician (Orthopedic Surgery) Sable Feil, MD as Consulting Physician (Gastroenterology) Allyn Kenner, DO as Consulting Physician (Obstetrics and Gynecology) Rutherford Guys, MD as Consulting Physician (Ophthalmology) Rolm Bookbinder, MD as Consulting Physician (Dermatology)  Medical History:  Past Medical History:  Diagnosis Date  . Arthritis   . Asthma    . Environmental allergies   . GERD (gastroesophageal reflux disease)   . Headache   . Hiatal hernia    with stricture dilation  . Hyperlipidemia   . Hypertension   . Kidney stones 2008  . Obesity, unspecified   . PONV (postoperative nausea and vomiting)   . UTI (urinary tract infection)     Allergies Allergies  Allergen Reactions  . Keflex [Cephalexin] Anaphylaxis and Swelling  . 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 FAMILY HISTORY Her family history includes Cancer in her father; Heart disease in her brother and mother; Hyperlipidemia in her mother; Hypertension in her mother; Pulmonary embolism in her brother; Stroke in her mother; Throat cancer in her father. SOCIAL HISTORY She  reports that she has never smoked. She has never used smokeless tobacco. She reports that she does not  drink alcohol and does not use drugs.  Review of Systems: Review of Systems  Constitutional: Negative for chills, fever and malaise/fatigue.  HENT: Negative for congestion, ear discharge, ear pain and sore throat.   Eyes: Negative.   Respiratory: Negative for cough, shortness of breath and wheezing.   Cardiovascular: Negative for chest pain, palpitations, claudication and leg swelling.  Gastrointestinal: Negative for blood in stool, constipation, diarrhea, heartburn, melena, nausea and vomiting.  Genitourinary: Negative for dysuria, flank pain, frequency, hematuria and urgency.  Musculoskeletal: Positive for joint pain (knee pain). Negative for falls.  Skin: Negative.   Neurological: Negative for dizziness, sensory change, loss of consciousness and headaches.  Psychiatric/Behavioral: Negative for depression, hallucinations, memory loss,  substance abuse and suicidal ideas. The patient is not nervous/anxious and does not have insomnia.     Physical Exam: Estimated body mass index is 48.36 kg/m as calculated from the following:   Height as of this encounter: 5\' 3"  (1.6 m).   Weight as of this encounter: 273 lb (123.8 kg). BP 122/80   Pulse 63   Temp (!) 97.3 F (36.3 C)   Ht 5\' 3"  (1.6 m)   Wt 273 lb (123.8 kg)   LMP 10/18/2013   SpO2 99%   BMI 48.36 kg/m   General Appearance: Well nourished well developed, in no apparent distress.  Eyes: PERRLA, EOMs, conjunctiva no swelling or erythema ENT/Mouth: Ear canals normal without obstruction, swelling, erythema, or discharge.  TMs normal bilaterally with no erythema, bulging, retraction, or loss of landmark.  Neck: Supple, thyroid normal, No bruits.  No cervical adenopathy Respiratory: Respiratory effort normal, Breath sounds clear A&P without wheeze, rhonchi, rales.   Cardio: RRR without murmurs, rubs or gallops. Brisk peripheral pulses without edema.  Chest: symmetric, with normal excursions Breasts:defer Abdomen: Morbidly obese,  Soft, nontender, no guarding, rebound, hernias, masses, or organomegaly.  Lymphatics: Non tender without lymphadenopathy.  Genitourinary: defer Musculoskeletal: Full ROM, 4/5 strength, and slow antalgic gait.  Skin: Warm, dry without rashes, lesions, ecchymosis. Neuro: Awake and oriented X 3, Cranial nerves intact, reflexes equal bilaterally. Normal muscle tone, no cerebellar symptoms. Sensation intact.  Psych:  normal affect, Insight and Judgment appropriate.   Christine [name] 9:36 AM [facility]

## 2019-06-14 ENCOUNTER — Other Ambulatory Visit: Payer: Self-pay

## 2019-06-14 ENCOUNTER — Ambulatory Visit (INDEPENDENT_AMBULATORY_CARE_PROVIDER_SITE_OTHER): Payer: Medicare HMO | Admitting: Physician Assistant

## 2019-06-14 ENCOUNTER — Encounter: Payer: Self-pay | Admitting: Physician Assistant

## 2019-06-14 VITALS — BP 122/80 | HR 63 | Temp 97.3°F | Ht 63.0 in | Wt 273.0 lb

## 2019-06-14 DIAGNOSIS — R7309 Other abnormal glucose: Secondary | ICD-10-CM | POA: Diagnosis not present

## 2019-06-14 DIAGNOSIS — K219 Gastro-esophageal reflux disease without esophagitis: Secondary | ICD-10-CM

## 2019-06-14 DIAGNOSIS — K449 Diaphragmatic hernia without obstruction or gangrene: Secondary | ICD-10-CM

## 2019-06-14 DIAGNOSIS — E785 Hyperlipidemia, unspecified: Secondary | ICD-10-CM | POA: Diagnosis not present

## 2019-06-14 DIAGNOSIS — E559 Vitamin D deficiency, unspecified: Secondary | ICD-10-CM | POA: Diagnosis not present

## 2019-06-14 DIAGNOSIS — Z79899 Other long term (current) drug therapy: Secondary | ICD-10-CM

## 2019-06-14 DIAGNOSIS — R350 Frequency of micturition: Secondary | ICD-10-CM

## 2019-06-14 DIAGNOSIS — D649 Anemia, unspecified: Secondary | ICD-10-CM

## 2019-06-14 DIAGNOSIS — I10 Essential (primary) hypertension: Secondary | ICD-10-CM | POA: Diagnosis not present

## 2019-06-14 DIAGNOSIS — Z136 Encounter for screening for cardiovascular disorders: Secondary | ICD-10-CM

## 2019-06-14 DIAGNOSIS — M1610 Unilateral primary osteoarthritis, unspecified hip: Secondary | ICD-10-CM

## 2019-06-14 DIAGNOSIS — Z0000 Encounter for general adult medical examination without abnormal findings: Secondary | ICD-10-CM

## 2019-06-14 DIAGNOSIS — N3946 Mixed incontinence: Secondary | ICD-10-CM

## 2019-06-14 DIAGNOSIS — F331 Major depressive disorder, recurrent, moderate: Secondary | ICD-10-CM

## 2019-06-14 DIAGNOSIS — M1990 Unspecified osteoarthritis, unspecified site: Secondary | ICD-10-CM

## 2019-06-14 DIAGNOSIS — Z0001 Encounter for general adult medical examination with abnormal findings: Secondary | ICD-10-CM

## 2019-06-14 NOTE — Patient Instructions (Addendum)
Counseling services  I suggest calling your insurance and finding out who is in your network and THEN calling those people or looking them up on google.   I'm a big fan of Cognitive Behavioral Therapy, look this up on You tube or check with the therapist you see if they are certified.  This form of therapy helps to teach you skills to better handle with current situation that are causing anxiety or depression.   There are some great apps too Check out [place], give thanks app.  Meditations apps are great like headspace.   Can try a medication if you change your mind.   Can do trial of Singulair this has new black box warning that can worsening depression.   Ask insurance and pharmacy about shingrix - it is a 2 part shot that we will not be getting in the office.   Suggest getting AFTER covid vaccines, have to wait at least a month This shot can make you feel bad due to such good immune response it can trigger some inflammation so take tylenol or aleve day of or day after and plan on resting.   Can go to [email or web address].br for more information  Shingrix Vaccination  Two vaccines are licensed and recommended to prevent shingles in the [place] Zoster vaccine live (ZVL, Zostavax) has been in use since 2006. Recombinant zoster vaccine (RZV, Shingrix), has been in use since 2017 and is recommended by ACIP as the preferred shingles vaccine.  What Everyone Should Know about Shingles Vaccine (Shingrix) One of the Recommended Vaccines by Disease Shingles vaccination is the only way to protect against shingles and postherpetic neuralgia (PHN), the most common complication from shingles. CDC recommends that healthy adults 50 years and older get two doses of the shingles vaccine called Shingrix (recombinant zoster vaccine), separated by 2 to 6 months, to prevent shingles and the complications from the disease. Your doctor or pharmacist can give you Shingrix  as a shot in your upper arm. Shingrix provides strong protection against shingles and PHN. Two doses of Shingrix is more than 90% effective at preventing shingles and PHN. Protection stays above 85% for at least the first four years after you get vaccinated. Shingrix is the preferred vaccine, over Zostavax (zoster vaccine live), a shingles vaccine in use since 2006. Zostavax may still be used to prevent shingles in healthy adults 60 years and older. For example, you could use Zostavax if a person is allergic to Shingrix, prefers Zostavax, or requests immediate vaccination and Shingrix is unavailable. Who Should Get Shingrix? Healthy adults 50 years and older should get two doses of Shingrix, separated by 2 to 6 months. You should get Shingrix even if in the past you . had shingles  . received Zostavax  . are not sure if you had chickenpox There is no maximum age for getting Shingrix. If you had shingles in the past, you can get Shingrix to help prevent future occurrences of the disease. There is no specific length of time that you need to wait after having shingles before you can receive Shingrix, but generally you should make sure the shingles rash has gone away before getting vaccinated. You can get Shingrix whether or not you remember having had chickenpox in the past. Studies show that more than 99% of Americans 40 years and older have had chickenpox, even if they don't remember having the disease. Chickenpox and shingles are related because they are caused by the same virus (varicella zoster virus). After a person recovers  from chickenpox, the virus stays dormant (inactive) in the body. It can reactivate years later and cause shingles. If you had Zostavax in the recent past, you should wait at least eight weeks before getting Shingrix. Talk to your healthcare provider to determine the best time to get Shingrix. Shingrix is available in [employer] and pharmacies. To find doctor's offices or  pharmacies near you that offer the vaccine, visit HealthMap Vaccine FinderExternal. If you have questions about Shingrix, talk with your healthcare provider. Vaccine for Those 83 Years and Older  Shingrix reduces the risk of shingles and PHN by more than 90% in people 80 and older. CDC recommends the vaccine for healthy adults 39 and older.  Who Should Not Get Shingrix? You should not get Shingrix if you: . have ever had a severe allergic reaction to any component of the vaccine or after a dose of Shingrix  . tested negative for immunity to varicella zoster virus. If you test negative, you should get chickenpox vaccine.  . currently have shingles  . currently are pregnant or breastfeeding. Women who are pregnant or breastfeeding should wait to get Shingrix.  Marland Kitchen receive specific antiviral drugs (acyclovir, famciclovir, or valacyclovir) 24 hours before vaccination (avoid use of these antiviral drugs for 14 days after vaccination)- zoster vaccine live only If you have a minor acute (starts suddenly) illness, such as a cold, you may get Shingrix. But if you have a moderate or severe acute illness, you should usually wait until you recover before getting the vaccine. This includes anyone with a temperature of 101.47F or higher. The side effects of the Shingrix are temporary, and usually last 2 to 3 days. While you may experience pain for a few days after getting Shingrix, the pain will be less severe than having shingles and the complications from the disease. How Well Does Shingrix Work? Two doses of Shingrix provides strong protection against shingles and postherpetic neuralgia (PHN), the most common complication of shingles. . In adults 55 to 62 years old who got two doses, Shingrix was 97% effective in preventing shingles; among adults 70 years and older, Shingrix was 91% effective.  . In adults 87 to 62 years old who got two doses, Shingrix was 91% effective in preventing PHN; among adults 70 years  and older, Shingrix was 89% effective. Shingrix protection remained high (more than 85%) in people 70 years and older throughout the four years following vaccination. Since your risk of shingles and PHN increases as you get older, it is important to have strong protection against shingles in your older years. Top of Page  What Are the Possible Side Effects of Shingrix? Studies show that Shingrix is safe. The vaccine helps your body create a strong defense against shingles. As a result, you are likely to have temporary side effects from getting the shots. The side effects may affect your ability to do normal daily activities for 2 to 3 days. Most people got a sore arm with mild or moderate pain after getting Shingrix, and some also had redness and swelling where they got the shot. Some people felt tired, had muscle pain, a headache, shivering, fever, stomach pain, or nausea. About 1 out of 6 people who got Shingrix experienced side effects that prevented them from doing regular activities. Symptoms went away on their own in about 2 to 3 days. Side effects were more common in younger people. You might have a reaction to the first or second dose of Shingrix, or both doses. If  you experience side effects, you may choose to take over-the-counter pain medicine such as ibuprofen or acetaminophen. If you experience side effects from Shingrix, you should report them to the Vaccine Adverse Event Reporting System (VAERS). Your doctor might file this report, or you can do it yourself through the VAERS websiteExternal, or by calling [phone]. If you have any questions about side effects from Shingrix, talk with your doctor. The shingles vaccine does not contain thimerosal (a preservative containing mercury). Top of Page  When Should I See a Doctor Because of the Side Effects I Experience From Shingrix? In clinical trials, Shingrix was not associated with serious adverse events. In fact, serious side effects  from vaccines are extremely rare. For example, for every 1 million doses of a vaccine given, only one or two people may have a severe allergic reaction. Signs of an allergic reaction happen within minutes or hours after vaccination and include hives, swelling of the face and throat, difficulty breathing, a fast heartbeat, dizziness, or weakness. If you experience these or any other life-threatening symptoms, see a doctor right away. Shingrix causes a strong response in your immune system, so it may produce short-term side effects more intense than you are used to from other vaccines. These side effects can be uncomfortable, but they are expected and usually go away on their own in 2 or 3 days. Top of Page  How Can I Pay For Shingrix? There are several ways shingles vaccine may be paid for: Medicare . Medicare Part D plans cover the shingles vaccine, but there may be a cost to you depending on your plan. There may be a copay for the vaccine, or you may need to pay in full then get reimbursed for a certain amount.  . Medicare Part B does not cover the shingles vaccine. Medicaid . Medicaid may or may not cover the vaccine. Contact your insurer to find out. Private health insurance . Many private health insurance plans will cover the vaccine, but there may be a cost to you depending on your plan. Contact your insurer to find out. Vaccine assistance programs . Some pharmaceutical companies provide vaccines to eligible adults who cannot afford them. You may want to check with the vaccine manufacturer, [employer], about Shingrix. If you do not currently have health insurance, learn more about affordable health coverage optionsExternal. To find doctor's offices or pharmacies near you that offer the vaccine, visit HealthMap Vaccine FinderExternal.

## 2019-06-16 LAB — HEMOGLOBIN A1C
Hgb A1c MFr Bld: 5.5 % of total Hgb (ref <5.7)
Mean Plasma Glucose: 111 (calc)
eAG (mmol/L): 6.2 (calc)

## 2019-06-16 LAB — VITAMIN D 25 HYDROXY (VIT D DEFICIENCY, FRACTURES): Vit D, 25-Hydroxy: 75 ng/mL (ref 30–100)

## 2019-06-16 LAB — COMPLETE METABOLIC PANEL WITH GFR
AG Ratio: 1.3 (calc) (ref 1.0–2.5)
ALT: 40 U/L — ABNORMAL HIGH (ref 6–29)
AST: 36 U/L — ABNORMAL HIGH (ref 10–35)
Albumin: 4.2 g/dL (ref 3.6–5.1)
Alkaline phosphatase (APISO): 60 U/L (ref 37–153)
BUN: 21 mg/dL (ref 7–25)
CO2: 22 mmol/L (ref 20–32)
Calcium: 10.1 mg/dL (ref 8.6–10.4)
Chloride: 102 mmol/L (ref 98–110)
Creat: 0.9 mg/dL (ref 0.50–0.99)
GFR, Est African American: 79 mL/min/{1.73_m2} (ref >=60)
GFR, Est Non African American: 69 mL/min/{1.73_m2} (ref >=60)
Globulin: 3.2 g/dL (calc) (ref 1.9–3.7)
Glucose, Bld: 97 mg/dL (ref 65–99)
Potassium: 4.1 mmol/L (ref 3.5–5.3)
Sodium: 136 mmol/L (ref 135–146)
Total Bilirubin: 0.6 mg/dL (ref 0.2–1.2)
Total Protein: 7.4 g/dL (ref 6.1–8.1)

## 2019-06-16 LAB — URINALYSIS, ROUTINE W REFLEX MICROSCOPIC
Bilirubin Urine: NEGATIVE
Glucose, UA: NEGATIVE
Hyaline Cast: NONE SEEN /LPF
Ketones, ur: NEGATIVE
Nitrite: NEGATIVE
Protein, ur: NEGATIVE
Specific Gravity, Urine: 1.007 (ref 1.001–1.03)
Squamous Epithelial / HPF: NONE SEEN /HPF (ref <=5)
WBC, UA: >=60 /HPF — AB (ref 0–5)
pH: 6.5 (ref 5.0–8.0)

## 2019-06-16 LAB — MICROALBUMIN / CREATININE URINE RATIO
Creatinine, Urine: 52 mg/dL (ref 20–275)
Microalb Creat Ratio: 94 mcg/mg creat — ABNORMAL HIGH (ref <30)
Microalb, Ur: 4.9 mg/dL

## 2019-06-16 LAB — CBC WITH DIFFERENTIAL/PLATELET
Absolute Monocytes: 640 cells/uL (ref 200–950)
Basophils Absolute: 80 cells/uL (ref 0–200)
Basophils Relative: 1 %
Eosinophils Absolute: 488 cells/uL (ref 15–500)
Eosinophils Relative: 6.1 %
HCT: 40.3 % (ref 35.0–45.0)
Hemoglobin: 13.4 g/dL (ref 11.7–15.5)
Lymphs Abs: 2280 cells/uL (ref 850–3900)
MCH: 30.7 pg (ref 27.0–33.0)
MCHC: 33.3 g/dL (ref 32.0–36.0)
MCV: 92.4 fL (ref 80.0–100.0)
MPV: 11.1 fL (ref 7.5–12.5)
Monocytes Relative: 8 %
Neutro Abs: 4512 cells/uL (ref 1500–7800)
Neutrophils Relative %: 56.4 %
Platelets: 241 10*3/uL (ref 140–400)
RBC: 4.36 10*6/uL (ref 3.80–5.10)
RDW: 12.6 % (ref 11.0–15.0)
Total Lymphocyte: 28.5 %
WBC: 8 10*3/uL (ref 3.8–10.8)

## 2019-06-16 LAB — URINE CULTURE
MICRO NUMBER:: 10577930
SPECIMEN QUALITY:: ADEQUATE

## 2019-06-16 LAB — MAGNESIUM: Magnesium: 1.8 mg/dL (ref 1.5–2.5)

## 2019-06-16 LAB — LIPID PANEL
Cholesterol: 105 mg/dL (ref <200)
HDL: 42 mg/dL — ABNORMAL LOW (ref >=50)
LDL Cholesterol (Calc): 43 mg/dL (calc)
Non-HDL Cholesterol (Calc): 63 mg/dL (calc) (ref <130)
Total CHOL/HDL Ratio: 2.5 (calc) (ref <5.0)
Triglycerides: 122 mg/dL (ref <150)

## 2019-06-16 LAB — TSH: TSH: 1.58 mIU/L (ref 0.40–4.50)

## 2019-06-18 ENCOUNTER — Ambulatory Visit: Payer: Medicare HMO | Admitting: Orthopedic Surgery

## 2019-06-18 ENCOUNTER — Ambulatory Visit: Payer: Self-pay

## 2019-06-18 DIAGNOSIS — M25512 Pain in left shoulder: Secondary | ICD-10-CM

## 2019-06-18 DIAGNOSIS — M1712 Unilateral primary osteoarthritis, left knee: Secondary | ICD-10-CM

## 2019-06-18 DIAGNOSIS — M25562 Pain in left knee: Secondary | ICD-10-CM | POA: Diagnosis not present

## 2019-06-18 DIAGNOSIS — G8929 Other chronic pain: Secondary | ICD-10-CM

## 2019-06-18 MED ORDER — SULFAMETHOXAZOLE-TRIMETHOPRIM 800-160 MG PO TABS: 1.0000 | ORAL_TABLET | Freq: Two times a day (BID) | ORAL | 0 refills | Status: DC

## 2019-06-18 NOTE — Progress Notes (Signed)
Subjective: Patient is here for ultrasound-guided intra-articular left glenohumeral injection.  She is status post right shoulder replacement.  Objective: Very limited active range of motion.  Procedure: Ultrasound-guided left glenohumeral injection: After sterile prep with Betadine, injected 8 cc 1% lidocaine without epinephrine and 40 mg methylprednisolone using a 22-gauge spinal needle, passing the needle from posterior approach into the glenohumeral joint.  Injectate was seen filling the joint capsule.  Follow-up as needed.

## 2019-06-18 NOTE — Addendum Note (Signed)
Addended by: Vicie Mutters R on: 06/18/2019 08:14 AM   Modules accepted: Orders

## 2019-06-23 ENCOUNTER — Encounter: Payer: Self-pay | Admitting: Orthopedic Surgery

## 2019-06-23 DIAGNOSIS — M25512 Pain in left shoulder: Secondary | ICD-10-CM | POA: Diagnosis not present

## 2019-06-23 DIAGNOSIS — M1712 Unilateral primary osteoarthritis, left knee: Secondary | ICD-10-CM | POA: Diagnosis not present

## 2019-06-23 DIAGNOSIS — M25562 Pain in left knee: Secondary | ICD-10-CM | POA: Diagnosis not present

## 2019-06-23 DIAGNOSIS — G8929 Other chronic pain: Secondary | ICD-10-CM | POA: Diagnosis not present

## 2019-06-23 MED ORDER — METHYLPREDNISOLONE ACETATE 40 MG/ML IJ SUSP
40.0000 mg | INTRAMUSCULAR | Status: AC | PRN
  Administered 2019-06-23: 40 mg via INTRA_ARTICULAR

## 2019-06-23 MED ORDER — LIDOCAINE HCL 1 % IJ SOLN
5.0000 mL | INTRAMUSCULAR | Status: AC | PRN
Start: 2019-06-23 — End: 2019-06-23
  Administered 2019-06-23: 5 mL

## 2019-06-23 MED ORDER — BUPIVACAINE HCL 0.25 % IJ SOLN
4.0000 mL | INTRAMUSCULAR | Status: AC | PRN
  Administered 2019-06-23: 4 mL via INTRA_ARTICULAR

## 2019-06-23 NOTE — Progress Notes (Signed)
Office Visit Note   Patient: Christine [name]           Date of Birth: 24-Apr-1955           MRN: [id number] Visit Date: 06/18/2019 Requested by: [name], [address] [place],  [place] [address] PCP: [name], MD  Subjective: Chief Complaint  Patient presents with  . Left Shoulder - Pain  . Left Knee - Pain    HPI: Christine [name] is a patient with left shoulder and left knee pain.  She has known arthritis in both these areas.  She is done well with right shoulder replacement.  Reports recurrent pain for the last 6 months which has become worse.              ROS: All systems reviewed are negative as they relate to the chief complaint within the history of present illness.  Patient denies  fevers or chills.   Assessment & Plan: Visit Diagnoses:  1. Chronic pain of left knee   2. Chronic left shoulder pain     Plan: Impression is left knee and left shoulder arthritis.  Left knee injections performed today.  I will send her up to Dr. Junius Roads for left shoulder ultrasound-guided injection.  She may need replacement on these in the future.  Overall her weight is relatively stable but still increased for lower extremity joint health.  Follow-up with [place] as needed.  Follow-Up Instructions: No follow-ups on file.   Orders:  Orders Placed This Encounter  Procedures  . XR Shoulder Left  . XR Knee 1-2 Views Left  . US Guided Needle Placement   No orders of the defined types were placed in this encounter.     Procedures: Large Joint Inj: L knee on 06/23/2019 1:49 PM Indications: diagnostic evaluation, joint swelling and pain Details: 18 G 1.5 in needle, superolateral approach  Arthrogram: No  Medications: 5 mL lidocaine 1 %; 40 mg methylPREDNISolone acetate 40 MG/ML; 4 mL bupivacaine 0.25 % Outcome: tolerated well, no immediate complications Procedure, treatment alternatives, risks and benefits explained, specific risks discussed. Consent was given by  the patient. Immediately prior to procedure a time out was called to verify the correct patient, procedure, equipment, support staff and site/side marked as required. Patient was prepped and draped in the usual sterile fashion.       Clinical Data: No additional findings.  Objective: Vital Signs: LMP 10/18/2013   Physical Exam:   Constitutional: Patient appears well-developed HEENT:  Head: Normocephalic Eyes:EOM are normal Neck: Normal range of motion Cardiovascular: Normal rate Pulmonary/chest: Effort normal Neurologic: Patient is alert Skin: Skin is warm Psychiatric: Patient has normal mood and affect    Ortho Exam: Ortho exam demonstrates diminished left shoulder active and passive range of motion to well below 90 degrees.  She has intact motor sensory function to the hand with functional deltoid.  External rotation is limited on the left-hand side passively to about 20 degrees.  Radial pulse intact bilaterally.  Left knee exam demonstrates extension lacking about 5 degrees and flexion to 90.  Extensor mechanism is intact.  No effusion present.  Specialty Comments:  No specialty comments available.  Imaging: No results found.   PMFS History: Patient Active Problem List   Diagnosis Date Noted  . Moderate episode of recurrent major depressive disorder ([facility]) 01/22/2019  . Anemia 01/22/2019  . Medication management 03/22/2017  . Vitamin D deficiency 03/22/2017  . Abnormal glucose 03/22/2017  . Osteoarthritis 03/22/2017  .  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 Arthritis of shoulder region, right, degenerative 06/04/2014  . Arthritis of hip 03/19/2014  . Hypertension   . Hyperlipidemia   . GERD (gastroesophageal reflux disease)   . Hiatal hernia   . Morbid obesity ([place])    Past Medical History:  Diagnosis Date  . Arthritis   . Asthma   . Environmental allergies   . GERD (gastroesophageal reflux disease)   . Headache   . Hiatal hernia     with stricture dilation  . Hyperlipidemia   . Hypertension   . Kidney stones 2008  . Obesity, unspecified   . PONV (postoperative nausea and vomiting)   . UTI (urinary tract infection)     Family History  Problem Relation Age of Onset  . Pulmonary embolism Brother   . Heart disease Brother   . Throat cancer Father   . Cancer Father        throat  . Stroke Mother   . Hypertension Mother   . Hyperlipidemia Mother   . Heart disease Mother   . Colon cancer Neg Hx     Past Surgical History:  Procedure Laterality Date  . BREAST BIOPSY Left    benign  . CHOLECYSTECTOMY  1987  . ESOPHAGEAL DILATION    . KNEE ARTHROSCOPY Right 2007  . REVERSE SHOULDER ARTHROPLASTY Right 06/04/2014   Procedure: REVERSE SHOULDER ARTHROPLASTY;  Surgeon: Meredith Pel, MD;  Location: [facility];  Service: Orthopedics;  Laterality: Right;  . TOTAL HIP ARTHROPLASTY Right 03/19/2014   Procedure: TOTAL HIP ARTHROPLASTY ANTERIOR APPROACH;  Surgeon: Meredith Pel, MD;  Location: [facility];  Service: Orthopedics;  Laterality: Right;   Social History   Occupational History  . Not on file  Tobacco Use  . Smoking status: Never Smoker  . Smokeless tobacco: Never Used  Substance and Sexual Activity  . Alcohol use: No    Alcohol/week: 0.0 standard drinks  . Drug use: No  . Sexual activity: Never

## 2019-06-25 ENCOUNTER — Ambulatory Visit: Payer: Medicare HMO

## 2019-07-04 MED ORDER — FLUCONAZOLE 150 MG PO TABS: 150.0000 mg | ORAL_TABLET | Freq: Every day | ORAL | 1 refills | Status: DC

## 2019-07-08 ENCOUNTER — Other Ambulatory Visit: Payer: Self-pay | Admitting: Internal Medicine

## 2019-07-08 DIAGNOSIS — E782 Mixed hyperlipidemia: Secondary | ICD-10-CM

## 2019-07-10 ENCOUNTER — Ambulatory Visit
Admission: RE | Admit: 2019-07-10 | Discharge: 2019-07-10 | Disposition: A | Discharge status: Home / Self Care | Payer: Medicare HMO | Source: Ambulatory Visit | Attending: Physician Assistant | Admitting: Physician Assistant

## 2019-07-10 ENCOUNTER — Other Ambulatory Visit: Payer: Self-pay

## 2019-07-10 DIAGNOSIS — Z1231 Encounter for screening mammogram for malignant neoplasm of breast: Secondary | ICD-10-CM

## 2019-07-16 ENCOUNTER — Other Ambulatory Visit: Payer: Self-pay

## 2019-07-16 ENCOUNTER — Ambulatory Visit (INDEPENDENT_AMBULATORY_CARE_PROVIDER_SITE_OTHER): Payer: Medicare HMO

## 2019-07-16 VITALS — Temp 97.6°F | Wt 273.0 lb

## 2019-07-16 DIAGNOSIS — R350 Frequency of micturition: Secondary | ICD-10-CM

## 2019-07-16 DIAGNOSIS — R8271 Bacteriuria: Secondary | ICD-10-CM

## 2019-07-16 NOTE — Progress Notes (Signed)
REPORTS FOR RE-CHECK URINE PATIENT REPORTS NO SXS AT THIS TIME.

## 2019-07-17 LAB — URINALYSIS, ROUTINE W REFLEX MICROSCOPIC
Bilirubin Urine: NEGATIVE
Glucose, UA: NEGATIVE
Hyaline Cast: NONE SEEN /LPF
Ketones, ur: NEGATIVE
Nitrite: NEGATIVE
Protein, ur: NEGATIVE
Specific Gravity, Urine: 1.008 (ref 1.001–1.03)
pH: 5.5 (ref 5.0–8.0)

## 2019-07-18 LAB — URINE CULTURE
MICRO NUMBER:: 10693617
SPECIMEN QUALITY:: ADEQUATE

## 2019-07-23 MED ORDER — CIPROFLOXACIN HCL 500 MG PO TABS: 500.0000 mg | ORAL_TABLET | Freq: Two times a day (BID) | ORAL | 0 refills | Status: AC

## 2019-07-23 MED ORDER — SULFAMETHOXAZOLE-TRIMETHOPRIM 800-160 MG PO TABS: 1.0000 | ORAL_TABLET | Freq: Two times a day (BID) | ORAL | 0 refills | Status: DC

## 2019-09-04 ENCOUNTER — Ambulatory Visit (INDEPENDENT_AMBULATORY_CARE_PROVIDER_SITE_OTHER): Payer: Medicare HMO

## 2019-09-04 ENCOUNTER — Other Ambulatory Visit: Payer: Self-pay

## 2019-09-04 VITALS — BP 132/68 | HR 80 | Temp 97.8°F | Wt 273.0 lb

## 2019-09-04 DIAGNOSIS — R8271 Bacteriuria: Secondary | ICD-10-CM

## 2019-09-04 NOTE — Progress Notes (Signed)
Reports for U/C nurse visit. Pt has finished ABX Vitals entered into chart

## 2019-09-05 LAB — URINE CULTURE
MICRO NUMBER:: 10894682
SPECIMEN QUALITY:: ADEQUATE

## 2019-09-17 ENCOUNTER — Other Ambulatory Visit: Payer: Self-pay | Admitting: Physician Assistant

## 2019-09-17 ENCOUNTER — Other Ambulatory Visit: Payer: Self-pay | Admitting: Internal Medicine

## 2019-09-17 DIAGNOSIS — N3946 Mixed incontinence: Secondary | ICD-10-CM

## 2019-09-17 DIAGNOSIS — M1990 Unspecified osteoarthritis, unspecified site: Secondary | ICD-10-CM

## 2019-09-18 ENCOUNTER — Other Ambulatory Visit: Payer: Self-pay | Admitting: Internal Medicine

## 2019-09-21 ENCOUNTER — Ambulatory Visit (INDEPENDENT_AMBULATORY_CARE_PROVIDER_SITE_OTHER): Payer: Medicare HMO | Admitting: Internal Medicine

## 2019-09-21 ENCOUNTER — Encounter: Payer: Self-pay | Admitting: Internal Medicine

## 2019-09-21 ENCOUNTER — Other Ambulatory Visit: Payer: Self-pay

## 2019-09-21 VITALS — BP 112/76 | HR 84 | Temp 97.0°F | Resp 16 | Ht 63.0 in | Wt 267.6 lb

## 2019-09-21 DIAGNOSIS — E559 Vitamin D deficiency, unspecified: Secondary | ICD-10-CM | POA: Diagnosis not present

## 2019-09-21 DIAGNOSIS — Z79899 Other long term (current) drug therapy: Secondary | ICD-10-CM

## 2019-09-21 DIAGNOSIS — I10 Essential (primary) hypertension: Secondary | ICD-10-CM

## 2019-09-21 DIAGNOSIS — K219 Gastro-esophageal reflux disease without esophagitis: Secondary | ICD-10-CM | POA: Diagnosis not present

## 2019-09-21 DIAGNOSIS — E782 Mixed hyperlipidemia: Secondary | ICD-10-CM | POA: Diagnosis not present

## 2019-09-21 DIAGNOSIS — R7309 Other abnormal glucose: Secondary | ICD-10-CM

## 2019-09-21 NOTE — Progress Notes (Signed)
History of Present Illness:       This very nice 64 y.o. single WF presents for 3 month follow up with HTN, HLD, Pre-Diabetes and Vitamin D Deficiency. Her GERD is controlled with her meds      Patient is treated for HTN (2000) & BP has been controlled at home. Today's BP is at goal - 112/76. Patient has had no complaints of any cardiac type chest pain, palpitations, dyspnea / orthopnea / PND, dizziness, claudication, or dependent edema.      Hyperlipidemia is controlled with diet & Rosuvastatin. Patient denies myalgias or other med SE's. Last Lipids were at goal:  Lab Results  Component Value Date   CHOL 105 06/14/2019   HDL 42 (L) 06/14/2019   LDLCALC 43 06/14/2019   TRIG 122 06/14/2019   CHOLHDL 2.5 06/14/2019    Also, the patient has  history of Morbid Obesity (BMI 48+) and  PreDiabetes (A1c 5.7% /2015 & 5.9% 2016)  and has had no symptoms of reactive hypoglycemia, diabetic polys, paresthesias or visual blurring.  Last A1c was Normal & at goal:  Lab Results  Component Value Date   HGBA1C 5.5 06/14/2019   Wt Readings from Last 3 Encounters:  09/21/19 267 lb 9.6 oz (121.4 kg)  09/04/19 273 lb (123.8 kg)  07/16/19 273 lb (123.8 kg)       Further, the patient also has history of Vitamin D Deficiency ("11"/2008) and supplements vitamin D without any suspected side-effects. Last vitamin D was at goal:  Lab Results  Component Value Date   VD25OH 41 06/14/2019    Current Outpatient Medications on File Prior to Visit  Medication Sig  . cholecalciferol (VITAMIN D) 1000 UNITS tablet Take 1,000 Units by mouth 3 (three) times daily.   . diphenhydramine-acetaminophen (TYLENOL PM) 25-500 MG TABS Take 2 tablets by mouth at bedtime as needed.  . fluconazole (DIFLUCAN) 150 MG tablet Take 1 tablet (150 mg total) by mouth daily.  . magnesium gluconate (MAGONATE) 500 MG tablet Take 500 mg by mouth 2 (two) times daily.  . meloxicam (MOBIC) 15 MG tablet TAKE 1/2 TO 1 TABLET DAILY  WITH FOOD FOR PAIN AND     INFLAMMATION  . montelukast (SINGULAIR) 10 MG tablet TAKE 1 TABLET AT BEDTIME  . oxybutynin (DITROPAN) 5 MG tablet TAKE 1 TABLET 3 TIMES A DAY  . Probiotic Product (PROBIOTIC DAILY PO) Take 1 tablet by mouth daily.  . rosuvastatin (CRESTOR) 20 MG tablet TAKE 1 TABLET DAILY FOR    CHOLESTEROL  . valsartan-hydrochlorothiazide (DIOVAN-HCT) 320-12.5 MG tablet Take 1 tablet Daily for BP   No current facility-administered medications on file prior to visit.    Allergies  Allergen Reactions  . Keflex [Cephalexin] Anaphylaxis and Swelling  . Codeine     REACTION: nausea/vomiting    PMHx:   Past Medical History:  Diagnosis Date  . Arthritis   . Asthma   . Environmental allergies   . GERD (gastroesophageal reflux disease)   . Headache   . Hiatal hernia    with stricture dilation  . Hyperlipidemia   . Hypertension   . Kidney stones 2008  . Obesity, unspecified   . PONV (postoperative nausea and vomiting)   . UTI (urinary tract infection)     Immunization History  Administered Date(s) Administered  . Influenza Inj Mdck Quad Pf 11/02/2016  . Influenza Inj Mdck Quad With Preservative 10/23/2018  . Influenza, Quadrivalent, Recombinant, Inj, Pf 10/06/2017  . Influenza-Unspecified  11/22/2014, 10/31/2015, 10/06/2017  . PFIZER SARS-COV-2 Vaccination 04/30/2019, 05/21/2019  . PPD Test 06/01/2013  . Pneumococcal Polysaccharide-23 06/08/2018  . Tdap 03/22/2017    Past Surgical History:  Procedure Laterality Date  . BREAST BIOPSY Left    benign  . CHOLECYSTECTOMY  1987  . ESOPHAGEAL DILATION    . KNEE ARTHROSCOPY Right 2007  . REVERSE SHOULDER ARTHROPLASTY Right 06/04/2014   Procedure: REVERSE SHOULDER ARTHROPLASTY;  Surgeon: Meredith Pel, MD;  Location: [facility];  Service: Orthopedics;  Laterality: Right;  . TOTAL HIP ARTHROPLASTY Right 03/19/2014   Procedure: TOTAL HIP ARTHROPLASTY ANTERIOR APPROACH;  Surgeon: Meredith Pel, MD;  Location: [facility];   Service: Orthopedics;  Laterality: Right;    FHx:    Reviewed / unchanged  SHx:    Reviewed / unchanged   Systems Review:  Constitutional: Denies fever, chills, wt changes, headaches, insomnia, fatigue, night sweats, change in appetite. Eyes: Denies redness, blurred vision, diplopia, discharge, itchy, watery eyes.  ENT: Denies discharge, congestion, post nasal drip, epistaxis, sore throat, earache, hearing loss, dental pain, tinnitus, vertigo, sinus pain, snoring.  CV: Denies chest pain, palpitations, irregular heartbeat, syncope, dyspnea, diaphoresis, orthopnea, PND, claudication or edema. Respiratory: denies cough, dyspnea, DOE, pleurisy, hoarseness, laryngitis, wheezing.  Gastrointestinal: Denies dysphagia, odynophagia, heartburn, reflux, water brash, abdominal pain or cramps, nausea, vomiting, bloating, diarrhea, constipation, hematemesis, melena, hematochezia  or hemorrhoids. Genitourinary: Denies dysuria, frequency, urgency, nocturia, hesitancy, discharge, hematuria or flank pain. Musculoskeletal: Denies arthralgias, myalgias, stiffness, jt. swelling, pain, limping or strain/sprain.  Skin: Denies pruritus, rash, hives, warts, acne, eczema or change in skin lesion(s). Neuro: No weakness, tremor, incoordination, spasms, paresthesia or pain. Psychiatric: Denies confusion, memory loss or sensory loss. Endo: Denies change in weight, skin or hair change.  Heme/Lymph: No excessive bleeding, bruising or enlarged lymph nodes.  Physical Exam  BP 112/76   P 84   T 97 F    R 16   Ht  63"    Wt 267 lb   LMP  2015   BMI 47.40    Appears  Over nourished  and in no distress.  Eyes: PERRLA, EOMs, conjunctiva no swelling or erythema. Sinuses: No frontal/maxillary tenderness ENT/Mouth: EAC's clear, TM's nl w/o erythema, bulging. Nares clear w/o erythema, swelling, exudates. Oropharynx clear without erythema or exudates. Oral hygiene is good. Tongue normal, non obstructing. Hearing intact.    Neck: Supple. Thyroid not palpable. Car 2+/2+ without bruits, nodes or JVD. Chest: Respirations nl with BS clear & equal w/o rales, rhonchi, wheezing or stridor.  Cor: Heart sounds normal w/ regular rate and rhythm without sig. murmurs, gallops, clicks or rubs. Peripheral pulses normal and equal  without edema.  Abdomen: Soft & bowel sounds normal. Non-tender w/o guarding, rebound, hernias, masses or organomegaly.  Lymphatics: Unremarkable.  Musculoskeletal: Full ROM all peripheral extremities, joint stability, 5/5 strength and normal gait.  Skin: Warm, dry without exposed rashes, lesions or ecchymosis apparent.  Neuro: Cranial nerves intact, reflexes equal bilaterally. Sensory-motor testing grossly intact. Tendon reflexes grossly intact.  Pysch: Alert & oriented x 3.  Insight and judgement nl & appropriate. No ideations.  Assessment and Plan:  1. Essential hypertension  - Continue medication, monitor blood pressure at home.  - Continue DASH diet.  Reminder to go to the ER if any CP,  SOB, nausea, dizziness, severe HA, changes vision/speech.  - CBC with Differential/Platelet - COMPLETE METABOLIC PANEL WITH GFR - Magnesium - TSH  2. Hyperlipidemia, mixed  - Continue diet/meds, exercise,& lifestyle  modifications.  - Continue monitor periodic cholesterol/liver & renal functions   - Lipid panel - TSH  3. Abnormal glucose  - Continue diet, exercise  - Lifestyle modifications.  - Monitor appropriate labs.  - Hemoglobin A1c - Insulin, random  4. Vitamin D deficiency  - Continue supplementation.  - VITAMIN D 25 Hydroxy   5. Gastroesophageal reflux disease  - CBC with Differential/Platelet  6. Morbid obesity ([facility])  - TSH  7. Medication management  - CBC with Differential/Platelet - COMPLETE METABOLIC PANEL WITH GFR - Magnesium - Lipid panel - TSH - Hemoglobin A1c - Insulin, random - VITAMIN D 25 Hydroxy        Discussed  regular exercise, BP monitoring,  weight control to achieve/maintain BMI less than 25 and discussed med and SE's. Recommended labs to assess and monitor clinical status with further disposition pending results of labs.  I discussed the assessment and treatment plan with the patient. The patient was provided an opportunity to ask questions and all were answered. The patient agreed with the plan and demonstrated an understanding of the instructions.  I provided over 30 minutes of exam, counseling, chart review and  complex critical decision making.     Kirtland Bouchard, MD

## 2019-09-21 NOTE — Patient Instructions (Signed)

## 2019-09-22 NOTE — Progress Notes (Signed)
========================================================== -   Test results slightly outside the reference range are not unusual. If there is anything important, I will review this with you,  otherwise it is considered normal test values.  If you have further questions,  please do not hesitate to contact me at the office or via My Chart.  ==========================================================  -   Magnesium  -   1.8  -  very  low- goal is betw 2.0 - 2.5,   - So...............  please be sure taking  Magnesium 500 mg tablet  x 2 tablets /Daily   - also important to eat lots of  leafy green vegetables   - spinach - Kale - collards - greens - okra - asparagus  - broccoli - quinoa - squash - almonds   - black, red, white beans  -  peas - green beans ==========================================================  -  Total Chol = 111 and LDL = 45  Excellent   - Very low risk for Heart Attack  / Stroke =============================================================  - Suggest you cut your Rosuvastatin / Crestor dose in 1/2 to 1/2 tablet Daily  ==========================================================  -  A1c - Normal - Great - No Diabetes ! ==========================================================  -  Vitamin D = 64 - Great  ==========================================================  -  All Else - CBC - Kidneys - Electrolytes - Liver - Magnesium & Thyroid    - all  Normal / OK =============================================================

## 2019-09-24 LAB — COMPLETE METABOLIC PANEL WITH GFR
AG Ratio: 1.4 (calc) (ref 1.0–2.5)
ALT: 32 U/L — ABNORMAL HIGH (ref 6–29)
AST: 32 U/L (ref 10–35)
Albumin: 4.2 g/dL (ref 3.6–5.1)
Alkaline phosphatase (APISO): 53 U/L (ref 37–153)
BUN: 16 mg/dL (ref 7–25)
CO2: 22 mmol/L (ref 20–32)
Calcium: 9.8 mg/dL (ref 8.6–10.4)
Chloride: 102 mmol/L (ref 98–110)
Creat: 0.97 mg/dL (ref 0.50–0.99)
GFR, Est African American: 73 mL/min/{1.73_m2} (ref >=60)
GFR, Est Non African American: 63 mL/min/{1.73_m2} (ref >=60)
Globulin: 3 g/dL (calc) (ref 1.9–3.7)
Glucose, Bld: 91 mg/dL (ref 65–99)
Potassium: 4.2 mmol/L (ref 3.5–5.3)
Sodium: 137 mmol/L (ref 135–146)
Total Bilirubin: 0.6 mg/dL (ref 0.2–1.2)
Total Protein: 7.2 g/dL (ref 6.1–8.1)

## 2019-09-24 LAB — CBC WITH DIFFERENTIAL/PLATELET
Absolute Monocytes: 592 cells/uL (ref 200–950)
Basophils Absolute: 52 cells/uL (ref 0–200)
Basophils Relative: 0.7 %
Eosinophils Absolute: 392 cells/uL (ref 15–500)
Eosinophils Relative: 5.3 %
HCT: 42.1 % (ref 35.0–45.0)
Hemoglobin: 14.1 g/dL (ref 11.7–15.5)
Lymphs Abs: 1702 cells/uL (ref 850–3900)
MCH: 30.9 pg (ref 27.0–33.0)
MCHC: 33.5 g/dL (ref 32.0–36.0)
MCV: 92.3 fL (ref 80.0–100.0)
MPV: 10.7 fL (ref 7.5–12.5)
Monocytes Relative: 8 %
Neutro Abs: 4662 cells/uL (ref 1500–7800)
Neutrophils Relative %: 63 %
Platelets: 214 10*3/uL (ref 140–400)
RBC: 4.56 10*6/uL (ref 3.80–5.10)
RDW: 12.7 % (ref 11.0–15.0)
Total Lymphocyte: 23 %
WBC: 7.4 10*3/uL (ref 3.8–10.8)

## 2019-09-24 LAB — TSH: TSH: 2.21 mIU/L (ref 0.40–4.50)

## 2019-09-24 LAB — MAGNESIUM: Magnesium: 1.8 mg/dL (ref 1.5–2.5)

## 2019-09-24 LAB — LIPID PANEL
Cholesterol: 111 mg/dL (ref <200)
HDL: 43 mg/dL — ABNORMAL LOW (ref >=50)
LDL Cholesterol (Calc): 45 mg/dL (calc)
Non-HDL Cholesterol (Calc): 68 mg/dL (calc) (ref <130)
Total CHOL/HDL Ratio: 2.6 (calc) (ref <5.0)
Triglycerides: 149 mg/dL (ref <150)

## 2019-09-24 LAB — VITAMIN D 25 HYDROXY (VIT D DEFICIENCY, FRACTURES): Vit D, 25-Hydroxy: 64 ng/mL (ref 30–100)

## 2019-09-24 LAB — HEMOGLOBIN A1C
Hgb A1c MFr Bld: 5.4 % of total Hgb (ref <5.7)
Mean Plasma Glucose: 108 (calc)
eAG (mmol/L): 6 (calc)

## 2019-09-24 LAB — INSULIN, RANDOM: Insulin: 39.3 u[IU]/mL — ABNORMAL HIGH

## 2019-10-23 DIAGNOSIS — R69 Illness, unspecified: Secondary | ICD-10-CM | POA: Diagnosis not present

## 2019-12-24 DIAGNOSIS — E538 Deficiency of other specified B group vitamins: Secondary | ICD-10-CM | POA: Insufficient documentation

## 2019-12-24 NOTE — Progress Notes (Signed)
Patient ID: Christine [name], female   DOB: [birth year], 64 y.o.   MRN: [id number]  3 MONTH FOLLOW UP  Assessment and Plan:  Hiatal hernia Continue PPI/H2 blocker, diet discussed  Morbid obesity ([facility]) - doing very well with slow steady weight loss with lifestyle changes, declines medications - follow up 3 months for progress monitoring - increase veggies, decrease carbs - long discussion about weight loss, diet, and exercise  Essential hypertension - continue medications, DASH diet, exercise and monitor at home. Call if greater than 130/80.  -     CBC with Differential/Platelet -     COMPLETE METABOLIC PANEL WITH GFR -     TSH  Hyperlipidemia, unspecified hyperlipidemia type check lipids decrease fatty foods increase activity.   Vitamin D deficiency At goal at recent check; continue to recommend supplementation for goal of 60-100 Defer vitamin D level  Abnormal glucose Recent A1Cs at goal Discussed diet/exercise, weight management  Defer A1C; check CMP  Mixed stress and urge urinary incontinence Weight loss advised, monitor  B12 deficiency Newly on supplement;  -     Vitamin B12  Discussed med's effects and SE's. Screening labs and tests as requested with regular follow-up as recommended.  Future Appointments  Date Time Provider [place]  03/31/2020  2:30 PM [name], MD [facility] None  07/09/2020  9:00 AM Liane Comber, NP [facility] None     HPI  64 y.o. female  presents for 3 month follow up on morbid obesity, htn ,hyperlipidemia, vitamin D def.   She has had hip and shoulder replacement with Dr. Marlou Sa, doing well.    Does have bil knee pain, currently managing with meloxicam 15 mg daily, tylenol PM at night.   She reports mood is improved; intermittently down due to her brother passing in 2016, didn't do well with wellbutrin, currently reports improved mood and declines interventions, "I can usually work myself out of it."   Her blood  pressure has been controlled at home, today their BP is BP: 130/76.  She does workout. She denies chest pain, shortness of breath, dizziness.   BMI is Body mass index is 45.88 kg/m., she is working on diet and exercise. Walking or body weight exercises depending on the weather, cutting portions, reducing sugar and flour. Down from peak weight 294 lb in 2020, down 8 lb since last visit.  Wt Readings from Last 10 Encounters:  12/25/19 259 lb (117.5 kg)  09/21/19 267 lb 9.6 oz (121.4 kg)  09/04/19 273 lb (123.8 kg)  07/16/19 273 lb (123.8 kg)  06/14/19 273 lb (123.8 kg)  01/24/19 271 lb (122.9 kg)  10/23/18 275 lb (124.7 kg)  09/21/18 281 lb (127.5 kg)  08/21/18 291 lb (132 kg)  07/11/18 294 lb (133.4 kg)   She is on cholesterol medication, she was started on crestor 20 mg every other day since the last visit, heart disease brother died 28, mother with MI in her 43's. Her cholesterol is not at goal. The cholesterol last visit was:  Lab Results  Component Value Date   CHOL 111 09/21/2019   HDL 43 (L) 09/21/2019   LDLCALC 45 09/21/2019   TRIG 149 09/21/2019   CHOLHDL 2.6 09/21/2019  . She has been working on diet and exercise for glucose management, she is not on bASA, she is not on ACE/ARB and denies foot ulcerations, hyperglycemia, hypoglycemia , increased appetite, nausea, paresthesia of the feet, polydipsia, polyuria, visual disturbances, vomiting and weight loss. Last A1C in the office  was:  Lab Results  Component Value Date   HGBA1C 5.4 09/21/2019    Last GFR:  Lab Results  Component Value Date   GFRNONAA 63 09/21/2019   Patient is on Vitamin D supplement,10,000.  Lab Results  Component Value Date   VD25OH 64 09/21/2019     She reports in the last month has been taking 2500 mcg SL B12 2 tabs daily Lab Results  Component Value Date   VITAMINB12 308 06/08/2018       Current Medications:  Current Outpatient Medications on File Prior to Visit  Medication Sig  .  cholecalciferol (VITAMIN D) 1000 UNITS tablet Take 1,000 Units by mouth 3 (three) times daily.   . diphenhydramine-acetaminophen (TYLENOL PM) 25-500 MG TABS Take 2 tablets by mouth at bedtime as needed.  . fluconazole (DIFLUCAN) 150 MG tablet Take 1 tablet (150 mg total) by mouth daily.  . magnesium gluconate (MAGONATE) 500 MG tablet Take 500 mg by mouth 2 (two) times daily.  . meloxicam (MOBIC) 15 MG tablet TAKE 1/2 TO 1 TABLET DAILY WITH FOOD FOR PAIN AND     INFLAMMATION  . montelukast (SINGULAIR) 10 MG tablet TAKE 1 TABLET AT BEDTIME  . oxybutynin (DITROPAN) 5 MG tablet TAKE 1 TABLET 3 TIMES A DAY  . Probiotic Product (PROBIOTIC DAILY PO) Take 1 tablet by mouth daily.  . rosuvastatin (CRESTOR) 20 MG tablet TAKE 1 TABLET DAILY FOR    CHOLESTEROL  . valsartan-hydrochlorothiazide (DIOVAN-HCT) 320-12.5 MG tablet Take 1 tablet Daily for BP   No current facility-administered medications on file prior to visit.    Medical History:  Past Medical History:  Diagnosis Date  . Arthritis   . Asthma   . Environmental allergies   . GERD (gastroesophageal reflux disease)   . Headache   . Hiatal hernia    with stricture dilation  . Hyperlipidemia   . Hypertension   . Kidney stones 2008  . Obesity, unspecified   . PONV (postoperative nausea and vomiting)   . UTI (urinary tract infection)     Allergies Allergies  Allergen Reactions  . Keflex [Cephalexin] Anaphylaxis and Swelling  . 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 FAMILY HISTORY Her family history includes Cancer in her father; Heart disease in her brother and mother; Hyperlipidemia in her mother; Hypertension in her mother; Pulmonary embolism in her brother; Stroke in her  mother; Throat cancer in her father. SOCIAL HISTORY She  reports that she has never smoked. She has never used smokeless tobacco. She reports that she does not drink alcohol and does not use drugs.  Review of Systems: Review of Systems  Constitutional: Negative for chills, fever and malaise/fatigue.  HENT: Negative for congestion, ear discharge, ear pain and sore throat.   Eyes: Negative.   Respiratory: Negative for cough, shortness of breath and wheezing.   Cardiovascular: Negative for chest pain, palpitations, claudication and leg swelling.  Gastrointestinal: Negative for blood in stool, constipation, diarrhea, heartburn, melena, nausea and vomiting.  Genitourinary: Negative for dysuria, flank pain, frequency, hematuria and urgency.  Musculoskeletal: Positive for joint pain (knee pain). Negative for falls.  Skin: Negative.   Neurological: Negative for dizziness, sensory change, loss of consciousness and headaches.  Psychiatric/Behavioral: Negative for depression, hallucinations, memory loss, substance abuse and suicidal ideas. The  patient is not nervous/anxious and does not have insomnia.     Physical Exam: Estimated body mass index is 45.88 kg/m as calculated from the following:   Height as of this encounter: 5\' 3"  (1.6 m).   Weight as of this encounter: 259 lb (117.5 kg). BP 130/76   Pulse 83   Temp (!) 97 F (36.1 C)   Ht 5\' 3"  (1.6 m)   Wt 259 lb (117.5 kg)   LMP 10/18/2013   SpO2 98%   BMI 45.88 kg/m   General Appearance: Well nourished well developed, morbidly obese female in no apparent distress.  Eyes: PERRLA, EOMs, conjunctiva no swelling or erythema ENT/Mouth: Ear canals normal without obstruction, swelling, erythema, or discharge.  TMs normal bilaterally with no erythema, bulging, retraction, or loss of landmark. Mouth and nose not examined- patient wearing a facemask Neck: Supple, thyroid normal, No bruits.  No cervical adenopathy Respiratory: Respiratory effort  normal, Breath sounds clear A&P without wheeze, rhonchi, rales.   Cardio: RRR without murmurs, rubs or gallops. Brisk peripheral pulses without edema.  Chest: symmetric, with normal excursions Breasts:defer Abdomen: Central obesity limiting exam, Soft, nontender, no guarding, rebound, palpable hernias, masses, or organomegaly.  Lymphatics: Non tender without lymphadenopathy.  Genitourinary: defer Musculoskeletal: Full ROM, 5/5 strength, and slow antalgic gait.   Skin: Warm, dry without rashes, lesions, ecchymosis. Neuro: Awake and oriented X 3, Cranial nerves intact, reflexes equal bilaterally. Normal muscle tone, no cerebellar symptoms. Sensation intact.  Psych:  normal affect, Insight and Judgment appropriate.   Gorden Harms Neena Beecham 11:41 AM [facility]

## 2019-12-25 ENCOUNTER — Encounter: Payer: Self-pay | Admitting: Adult Health

## 2019-12-25 ENCOUNTER — Other Ambulatory Visit: Payer: Self-pay

## 2019-12-25 ENCOUNTER — Ambulatory Visit (INDEPENDENT_AMBULATORY_CARE_PROVIDER_SITE_OTHER): Payer: Medicare HMO | Admitting: Adult Health

## 2019-12-25 VITALS — BP 130/76 | HR 83 | Temp 97.0°F | Ht 63.0 in | Wt 259.0 lb

## 2019-12-25 DIAGNOSIS — G8929 Other chronic pain: Secondary | ICD-10-CM

## 2019-12-25 DIAGNOSIS — M25561 Pain in right knee: Secondary | ICD-10-CM | POA: Insufficient documentation

## 2019-12-25 DIAGNOSIS — N182 Chronic kidney disease, stage 2 (mild): Secondary | ICD-10-CM | POA: Insufficient documentation

## 2019-12-25 DIAGNOSIS — I10 Essential (primary) hypertension: Secondary | ICD-10-CM

## 2019-12-25 DIAGNOSIS — F3340 Major depressive disorder, recurrent, in remission, unspecified: Secondary | ICD-10-CM | POA: Diagnosis not present

## 2019-12-25 DIAGNOSIS — Z79899 Other long term (current) drug therapy: Secondary | ICD-10-CM | POA: Diagnosis not present

## 2019-12-25 DIAGNOSIS — E538 Deficiency of other specified B group vitamins: Secondary | ICD-10-CM | POA: Diagnosis not present

## 2019-12-25 DIAGNOSIS — E559 Vitamin D deficiency, unspecified: Secondary | ICD-10-CM | POA: Diagnosis not present

## 2019-12-25 DIAGNOSIS — R7309 Other abnormal glucose: Secondary | ICD-10-CM

## 2019-12-25 DIAGNOSIS — M25562 Pain in left knee: Secondary | ICD-10-CM

## 2019-12-25 DIAGNOSIS — E785 Hyperlipidemia, unspecified: Secondary | ICD-10-CM

## 2019-12-25 DIAGNOSIS — N183 Chronic kidney disease, stage 3 unspecified: Secondary | ICD-10-CM | POA: Insufficient documentation

## 2019-12-25 DIAGNOSIS — R69 Illness, unspecified: Secondary | ICD-10-CM | POA: Diagnosis not present

## 2019-12-25 MED ORDER — FLUCONAZOLE 150 MG PO TABS: 150.0000 mg | ORAL_TABLET | Freq: Every day | ORAL | 1 refills | Status: DC

## 2019-12-25 NOTE — Patient Instructions (Addendum)
Recommend avoiding taking meloxicam daily if possible - can try 1/2 tab instead, or try tylenol on alternating days   Chronic Kidney Disease, Adult Chronic kidney disease (CKD) occurs when the kidneys become damaged slowly over a long period of time. The kidneys are a pair of organs that do many important jobs in the body, including:  Removing waste and extra fluid from the blood to make urine.  Making hormones that maintain the amount of fluid in tissues and blood vessels.  Maintaining the right amount of fluids and chemicals in the body. A small amount of kidney damage may not cause problems, but a large amount of damage may make it hard or impossible for the kidneys to work the way they should. If steps are not taken to slow down kidney damage or to stop it from getting worse, the kidneys may stop working permanently (end-stage renal disease or ESRD). Most of the time, CKD does not go away, but it can often be controlled. People who have CKD are usually able to live normal lives. What are the causes? The most common causes of this condition are diabetes and high blood pressure (hypertension). Other causes include:  Heart and blood vessel (cardiovascular) disease.  Kidney diseases, such as: ? Glomerulonephritis. ? Interstitial nephritis. ? Polycystic kidney disease. ? Renal vascular disease.  Diseases that affect the immune system.  Genetic diseases.  Medicines that damage the kidneys, such as anti-inflammatory medicines.  Being around or being in contact with poisonous (toxic) substances.  A kidney or urinary infection that occurs again and again (recurs).  Vasculitis. This is swelling or inflammation of the blood vessels.  A problem with urine flow that may be caused by: ? Cancer. ? Having kidney stones more than one time. ? An enlarged prostate, in males. What increases the risk? You are more likely to develop this condition if you:  Are older than age 63.  Are  female.  Are African-American, Hispanic, Asian, [place], or American [place].  Are a current or former smoker.  Are obese.  Have a family history of kidney disease or failure.  Often take medicines that are damaging to the kidneys. What are the signs or symptoms? Symptoms of this condition include:  Swelling (edema) of the face, legs, ankles, or feet.  Tiredness (lethargy) and having less energy.  Nausea or vomiting.  Confusion or trouble concentrating.  Problems with urination, such as: ? Painful or burning feeling during urination. ? Decreased urine production. ? Frequent urination, especially at night. ? Bloody urine.  Muscle twitches and cramps, especially in the legs.  Shortness of breath.  Weakness.  Loss of appetite.  Metallic taste in the mouth.  Trouble sleeping.  Dry, itchy skin.  A low blood count (anemia).  Pale lining of the eyelids and surface of the eye (conjunctiva). Symptoms develop slowly and may not be obvious until the kidney damage becomes severe. It is possible to have kidney disease for years without having any symptoms. How is this diagnosed? This condition may be diagnosed based on:  Blood tests.  Urine tests.  Imaging tests, such as an ultrasound or CT scan.  A test in which a sample of tissue is removed from the kidneys to be examined under a microscope (kidney biopsy). These test results will help your health care provider determine how serious the CKD is. How is this treated? There is no cure for most cases of this condition, but treatment usually relieves symptoms and prevents or slows the  progression of the disease. Treatment may include:  Making diet changes, which may require you to avoid alcohol, salty foods (sodium), and foods that are high in potassium, calcium, and protein.  Medicines: ? To lower blood pressure. ? To control blood glucose. ? To relieve anemia. ? To relieve swelling. ? To protect your  bones. ? To improve the balance of electrolytes in your blood.  Removing toxic waste from the body through types of dialysis, if the kidneys can no longer do their job (kidney failure).  Managing any other conditions that are causing your CKD or making it worse. Follow these instructions at home: Medicines  Take over-the-counter and prescription medicines only as told by your health care provider. The dose of some medicines that you take may need to be adjusted.  Do not take any new medicines unless approved by your health care provider. Many medicines can worsen your kidney damage.  Do not take any vitamin and mineral supplements unless approved by your health care provider. Many nutritional supplements can worsen your kidney damage. General instructions  Follow your prescribed diet as told by your health care provider.  Do not use any products that contain nicotine or tobacco, such as cigarettes and e-cigarettes. If you need help quitting, ask your health care provider.  Monitor and track your blood pressure at home. Report changes in your blood pressure as told by your health care provider.  If you are being treated for diabetes, monitor and track your blood sugar (blood glucose) levels as told by your health care provider.  Maintain a healthy weight. If you need help with this, ask your health care provider.  Start or continue an exercise plan. Exercise at least 30 minutes a day, 5 days a week.  Keep your immunizations up to date as told by your health care provider.  Keep all follow-up visits as told by your health care provider. This is important. Where to find more information  American Association of Kidney Patients: BombTimer.gl  National Kidney Foundation: www.kidney.[place]: https://mathis.com/  [facility]: www.lifeoptions.org and www.kidneyschool.org Contact a health care provider if:  Your symptoms get worse.  You develop  new symptoms. Get help right away if:  You develop symptoms of ESRD, which include: ? Headaches. ? Numbness in the hands or feet. ? Easy bruising. ? Frequent hiccups. ? Chest pain. ? Shortness of breath. ? Lack of menstruation, in women.  You have a fever.  You have decreased urine production.  You have pain or bleeding when you urinate. Summary  Chronic kidney disease (CKD) occurs when the kidneys become damaged slowly over a long period of time.  The most common causes of this condition are diabetes and high blood pressure (hypertension).  There is no cure for most cases of this condition, but treatment usually relieves symptoms and prevents or slows the progression of the disease. Treatment may include a combination of medicines and lifestyle changes. This information is not intended to replace advice given to you by your health care provider. Make sure you discuss any questions you have with your health care provider. Document Revised: 12/03/2016 Document Reviewed: 01/29/2016 Elsevier Patient Education  2020 [employer].

## 2019-12-26 ENCOUNTER — Encounter: Payer: Self-pay | Admitting: Adult Health

## 2019-12-26 ENCOUNTER — Other Ambulatory Visit: Payer: Self-pay | Admitting: Adult Health

## 2019-12-26 DIAGNOSIS — R7989 Other specified abnormal findings of blood chemistry: Secondary | ICD-10-CM

## 2019-12-26 LAB — COMPLETE METABOLIC PANEL WITH GFR
AG Ratio: 1.4 (calc) (ref 1.0–2.5)
ALT: 33 U/L — ABNORMAL HIGH (ref 6–29)
AST: 36 U/L — ABNORMAL HIGH (ref 10–35)
Albumin: 4.6 g/dL (ref 3.6–5.1)
Alkaline phosphatase (APISO): 64 U/L (ref 37–153)
BUN/Creatinine Ratio: 20 (calc) (ref 6–22)
BUN: 20 mg/dL (ref 7–25)
CO2: 27 mmol/L (ref 20–32)
Calcium: 10.3 mg/dL (ref 8.6–10.4)
Chloride: 98 mmol/L (ref 98–110)
Creat: 1 mg/dL — ABNORMAL HIGH (ref 0.50–0.99)
GFR, Est African American: 70 mL/min/{1.73_m2} (ref >=60)
GFR, Est Non African American: 60 mL/min/{1.73_m2} (ref >=60)
Globulin: 3.3 g/dL (calc) (ref 1.9–3.7)
Glucose, Bld: 81 mg/dL (ref 65–99)
Potassium: 4.2 mmol/L (ref 3.5–5.3)
Sodium: 136 mmol/L (ref 135–146)
Total Bilirubin: 0.8 mg/dL (ref 0.2–1.2)
Total Protein: 7.9 g/dL (ref 6.1–8.1)

## 2019-12-26 LAB — LIPID PANEL
Cholesterol: 122 mg/dL (ref <200)
HDL: 44 mg/dL — ABNORMAL LOW (ref >=50)
LDL Cholesterol (Calc): 54 mg/dL (calc)
Non-HDL Cholesterol (Calc): 78 mg/dL (calc) (ref <130)
Total CHOL/HDL Ratio: 2.8 (calc) (ref <5.0)
Triglycerides: 160 mg/dL — ABNORMAL HIGH (ref <150)

## 2019-12-26 LAB — CBC WITH DIFFERENTIAL/PLATELET
Absolute Monocytes: 640 cells/uL (ref 200–950)
Basophils Absolute: 89 cells/uL (ref 0–200)
Basophils Relative: 1.1 %
Eosinophils Absolute: 470 cells/uL (ref 15–500)
Eosinophils Relative: 5.8 %
HCT: 41.9 % (ref 35.0–45.0)
Hemoglobin: 14.2 g/dL (ref 11.7–15.5)
Lymphs Abs: 2390 cells/uL (ref 850–3900)
MCH: 31 pg (ref 27.0–33.0)
MCHC: 33.9 g/dL (ref 32.0–36.0)
MCV: 91.5 fL (ref 80.0–100.0)
MPV: 10.9 fL (ref 7.5–12.5)
Monocytes Relative: 7.9 %
Neutro Abs: 4512 cells/uL (ref 1500–7800)
Neutrophils Relative %: 55.7 %
Platelets: 231 10*3/uL (ref 140–400)
RBC: 4.58 10*6/uL (ref 3.80–5.10)
RDW: 12.7 % (ref 11.0–15.0)
Total Lymphocyte: 29.5 %
WBC: 8.1 10*3/uL (ref 3.8–10.8)

## 2019-12-26 LAB — VITAMIN B12: Vitamin B-12: >2000 pg/mL — ABNORMAL HIGH (ref 200–1100)

## 2019-12-26 LAB — MAGNESIUM: Magnesium: 2 mg/dL (ref 1.5–2.5)

## 2019-12-26 LAB — TSH: TSH: 2.01 mIU/L (ref 0.40–4.50)

## 2019-12-26 MED ORDER — B-12 2500 MCG SL SUBL: 1.0000 | SUBLINGUAL_TABLET | Freq: Every day | SUBLINGUAL | Status: DC

## 2020-01-16 ENCOUNTER — Encounter: Payer: Self-pay | Admitting: Adult Health

## 2020-01-16 ENCOUNTER — Ambulatory Visit
Admission: RE | Admit: 2020-01-16 | Discharge: 2020-01-16 | Disposition: A | Discharge status: Home / Self Care | Payer: Medicare HMO | Source: Ambulatory Visit | Attending: Adult Health | Admitting: Adult Health

## 2020-01-16 DIAGNOSIS — K760 Fatty (change of) liver, not elsewhere classified: Secondary | ICD-10-CM | POA: Insufficient documentation

## 2020-01-16 DIAGNOSIS — R7989 Other specified abnormal findings of blood chemistry: Secondary | ICD-10-CM | POA: Diagnosis not present

## 2020-02-15 ENCOUNTER — Other Ambulatory Visit: Payer: Self-pay | Admitting: Internal Medicine

## 2020-02-15 MED ORDER — PHENTERMINE HCL 37.5 MG PO TABS: ORAL_TABLET | ORAL | 1 refills | Status: DC

## 2020-03-28 NOTE — Progress Notes (Signed)
Patient ID: Christine [name], female   DOB: 05-15-1955, 65 y.o.   MRN: [id number]  MEDICARE VISIT  Assessment and Plan:  Encounter for Annual Medicare Visit Due annually   Morbid obesity ([place]) - follow up 3 months for progress monitoring - increase veggies, decrease carbs - long discussion about weight loss, diet, and exercise - doing well with lifestyle changes, steady progress  Essential hypertension - continue medications, DASH diet, exercise and monitor at home. Call if greater than 130/80.  -     CBC with Differential/Platelet -     COMPLETE METABOLIC PANEL WITH GFR -     TSH  Hyperlipidemia, unspecified hyperlipidemia type Continue medications LDL goal <100 Continue low cholesterol diet and exercise.  Check lipid panel.  -     Lipid panel -     TSH  Medication management At each visit  Vitamin D deficiency At goal at recent check; continue to recommend supplementation for goal of 60-100 Defer vitamin D level  Abnormal glucose (hx of prediabetes) Recent A1Cs at goal Discussed diet/exercise, weight management  Defer A1C to CPE; check CMP  Depression, major, in remission ([place]) In remission off of medications; monitor Lifestyle discussed: diet/exerise, sleep hygiene, stress management, hydration  Hiatal hernia/ GERD Continue PPI/H2 blocker, diet discussed Fatty liver Weight loss advised, avoid alcohol/tylenol, will monitor LFTs   Discussed med's effects and SE's. Screening labs and tests as requested with regular follow-up as recommended.  Future Appointments  Date Time Provider [place]  07/09/2020  9:00 AM Liane Comber, NP [facility] None  03/31/2021 11:00 AM Liane Comber, NP [facility] None     HPI  65 y.o. female  presents for AWV and follow up. She has Hypertension; Hyperlipidemia; GERD (gastroesophageal reflux disease); Hiatal hernia; Morbid obesity ([place]); Arthritis of hip; Arthritis of shoulder region, right, degenerative;  Medication management; Vitamin D deficiency; Abnormal glucose; Osteoarthritis; Mixed stress and urge urinary incontinence; Recurrent major depressive disorder, in remission ([place]); B12 deficiency; CKD (chronic kidney disease) stage 2, GFR 60-89 ml/min; Bilateral knee pain; and Fatty liver on their problem list.   She has had hip and shoulder replacement with Dr. Marlou Sa, doing well.   Does have bil knee pain, arthritis, currently managing with meloxicam 15 mg daily, tylenol PM at night.   She reports mood is improved; intermittently down due to her brother passing in 2016, didn't do well with wellbutrin, currently reports improved mood and declines interventions, "I can usually work myself out of it."   BMI is Body mass index is 45.17 kg/m., she has been working on diet and exercise. she is working on diet and exercise. Walking or body weight exercises depending on the weather, cutting portions, reducing sugar and flour. Down from peak weight 294 lb in 2020, down 4 lb since last visit.  Wt Readings from Last 3 Encounters:  03/31/20 255 lb (115.7 kg)  12/25/19 259 lb (117.5 kg)  09/21/19 267 lb 9.6 oz (121.4 kg)   Her blood pressure has been controlled at home, today their BP is BP: 110/64.   She does workout. She denies chest pain, shortness of breath, dizziness.   She is on cholesterol medication, she is taking crestor 20 mg daily, heart disease brother died 43, mother with MI in her 5's. Her cholesterol is at goal. The cholesterol last visit was:  Lab Results  Component Value Date   CHOL 122 12/25/2019   HDL 44 (L) 12/25/2019   LDLCALC 54 12/25/2019   TRIG 160 (H) 12/25/2019  CHOLHDL 2.8 12/25/2019  . She has been working on diet and exercise for hx of prediabetes (A1C 5.9% in 2016), she is not on bASA, she is not on ACE/ARB and denies foot ulcerations, hyperglycemia, hypoglycemia , increased appetite, nausea, paresthesia of the feet, polydipsia, polyuria, visual disturbances, vomiting and  weight loss. Last A1C in the office was:  Lab Results  Component Value Date   HGBA1C 5.4 09/21/2019    Last GFR:  Lab Results  Component Value Date   GFRNONAA 60 12/25/2019   Patient is on Vitamin D supplement,10,000.  Lab Results  Component Value Date   VD25OH 64 09/21/2019       Current Medications:    Current Outpatient Medications (Cardiovascular):  .  rosuvastatin (CRESTOR) 20 MG tablet, TAKE 1 TABLET DAILY FOR    CHOLESTEROL .  valsartan-hydrochlorothiazide (DIOVAN-HCT) 320-12.5 MG tablet, Take 1 tablet Daily for BP  Current Outpatient Medications (Respiratory):  .  montelukast (SINGULAIR) 10 MG tablet, TAKE 1 TABLET AT BEDTIME  Current Outpatient Medications (Analgesics):  .  meloxicam (MOBIC) 15 MG tablet, TAKE 1/2 TO 1 TABLET DAILY WITH FOOD FOR PAIN AND     INFLAMMATION  Current Outpatient Medications (Hematological):  Marland Kitchen  Cyanocobalamin (B-12) 2500 MCG SUBL, Place 1 tablet under the tongue daily.  Current Outpatient Medications (Other):  .  cholecalciferol (VITAMIN D) 1000 UNITS tablet, Take 1,000 Units by mouth 3 (three) times daily.  .  diphenhydramine-acetaminophen (TYLENOL PM) 25-500 MG TABS, Take 2 tablets by mouth at bedtime as needed. .  fluconazole (DIFLUCAN) 150 MG tablet, Take 1 tablet (150 mg total) by mouth daily. .  magnesium gluconate (MAGONATE) 500 MG tablet, Take 500 mg by mouth 2 (two) times daily. Marland Kitchen  oxybutynin (DITROPAN) 5 MG tablet, TAKE 1 TABLET 3 TIMES A DAY .  Probiotic Product (PROBIOTIC DAILY PO), Take 1 tablet by mouth daily. .  vitamin E (VITAMIN E) 200 UNIT capsule, Take 200 Units by mouth daily. .  phentermine (ADIPEX-P) 37.5 MG tablet, Take 1/2 to 1 tablet every Morning for Dieting & Weight Loss (Patient not taking: Reported on 03/31/2020)  Health Maintenance:   Immunization History  Administered Date(s) Administered  . Influenza Inj Mdck Quad Pf 11/02/2016, 10/23/2019  . Influenza Inj Mdck Quad With Preservative 10/23/2018  .  Influenza, Quadrivalent, Recombinant, Inj, Pf 10/06/2017  . Influenza-Unspecified 11/22/2014, 10/31/2015, 10/06/2017  . PFIZER(Purple Top)SARS-COV-2 Vaccination 04/30/2019, 05/21/2019, 11/23/2019  . PPD Test 06/01/2013  . Pneumococcal Polysaccharide-23 06/08/2018  . Tdap 03/22/2017     TDAP 2019 Influenza 10/2019 Pneumovax 2020 Prevnar 13 at 73 Shingrix check with insurance  Covid 19: 3/3, pfizer  Colonoscopy 2016 q 5 years- DUE THIS YEAR, will go after July- no family history- Dr. Hilarie Fredrickson MGM  07/2019- no family history DEXA 05/2013 PAP 2019 normal, doing here, neg HPV q 5 years  Last eye: Dr. Deon Pilling, glasses, last 05/2019 Last dental: overdue, last 3-4 years, Dr. Philipp Ovens retired, needs to schedule with Dr. Altamese Clare  Patient Care Team: [name], MD as PCP - General (Internal Medicine) Marlou Sa, Tonna Corner, MD as Consulting Physician (Orthopedic Surgery) Rolm Bookbinder, MD as Consulting Physician (Dermatology) Pyrtle, Lajuan Lines, MD as Consulting Physician (Gastroenterology)  Medical History:  Past Medical History:  Diagnosis Date  . Arthritis   . Asthma   . Environmental allergies   . GERD (gastroesophageal reflux disease)   . Headache   . Hiatal hernia    with stricture dilation  . Hyperlipidemia   . Hypertension   .  Kidney stones 2008  . Obesity, unspecified   . PONV (postoperative nausea and vomiting)   . UTI (urinary tract infection)     Allergies Allergies  Allergen Reactions  . Keflex [Cephalexin] Anaphylaxis and Swelling  . Codeine     REACTION: nausea/vomiting  . Wellbutrin [Bupropion] Diarrhea    SURGICAL HISTORY She  has a past surgical history that includes Cholecystectomy (1987); Esophageal dilation; Knee arthroscopy (Right, 2007); Total hip arthroplasty (Right, 03/19/2014); Reverse shoulder arthroplasty (Right, 06/04/2014); and Breast biopsy (Left). FAMILY HISTORY Her family history includes Cancer in her father; Heart disease in her brother and  mother; Hyperlipidemia in her mother; Hypertension in her mother; Pulmonary embolism in her brother; Stroke in her mother; Throat cancer in her father. SOCIAL HISTORY She  reports that she has never smoked. She has never used smokeless tobacco. She reports that she does not drink alcohol and does not use drugs.   MEDICARE WELLNESS OBJECTIVES: Physical activity:   Cardiac risk factors:   Depression/mood screen:   Depression screen [facility] 2/9 12/25/2019  Decreased Interest 0  Down, Depressed, Hopeless 0  PHQ - 2 Score 0  Altered sleeping 0  Tired, decreased energy 0  Change in appetite 0  Feeling bad or failure about yourself  0  Trouble concentrating 0  Moving slowly or fidgety/restless 0  Suicidal thoughts 0  PHQ-9 Score 0  Difficult doing work/chores Not difficult at all    ADLs:  In your present state of health, do you have any difficulty performing the following activities: 09/21/2019  Hearing? N  Vision? N  Difficulty concentrating or making decisions? N  Walking or climbing stairs? N  Dressing or bathing? N  Doing errands, shopping? N  Some recent data might be hidden     Cognitive Testing  Alert? Yes  Normal Appearance?Yes  Oriented to person? Yes  Place? Yes   Time? Yes  Recall of three objects?  Yes  Can perform simple calculations? Yes  Displays appropriate judgment?Yes  Can read the correct time from a watch face?Yes  EOL planning:        Review of Systems: Review of Systems  Constitutional: Negative for chills, fever, malaise/fatigue and weight loss.  HENT: Negative for congestion, ear discharge, ear pain, hearing loss, sore throat and tinnitus.   Eyes: Negative.  Negative for blurred vision and double vision.  Respiratory: Negative for cough, sputum production, shortness of breath and wheezing.   Cardiovascular: Negative for chest pain, palpitations, orthopnea, claudication, leg swelling and PND.  Gastrointestinal: Negative for abdominal pain, blood in  stool, constipation, diarrhea, heartburn, melena, nausea and vomiting.  Genitourinary: Negative.  Negative for dysuria, flank pain, frequency, hematuria and urgency.  Musculoskeletal: Positive for joint pain (knee pain). Negative for falls and myalgias.  Skin: Negative.  Negative for rash.  Neurological: Negative for dizziness, tingling, sensory change, loss of consciousness, weakness and headaches.  Endo/Heme/Allergies: Negative for polydipsia.  Psychiatric/Behavioral: Negative.  Negative for depression, hallucinations, memory loss, substance abuse and suicidal ideas. The patient is not nervous/anxious and does not have insomnia.   All other systems reviewed and are negative.   Physical Exam: Estimated body mass index is 45.17 kg/m as calculated from the following:   Height as of 12/25/19: 5\' 3"  (1.6 m).   Weight as of this encounter: 255 lb (115.7 kg). BP 110/64   Pulse 88   Temp (!) 94.5 F (34.7 C)   Wt 255 lb (115.7 kg)   LMP 10/18/2013   SpO2 99%  BMI 45.17 kg/m   General Appearance: Well nourished well developed, in no apparent distress.  Eyes: PERRLA, EOMs, conjunctiva no swelling or erythema ENT/Mouth: Ear canals normal without obstruction, swelling, erythema, or discharge.  TMs normal bilaterally with no erythema, bulging, retraction, or loss of landmark.  Neck: Supple, thyroid normal, No bruits.  No cervical adenopathy Respiratory: Respiratory effort normal, Breath sounds clear A&P without wheeze, rhonchi, rales.   Cardio: RRR without murmurs, rubs or gallops. Brisk peripheral pulses without edema.  Chest: symmetric, with normal excursions Breasts:defer Abdomen: Morbidly obese,  Soft, nontender, no guarding, rebound, hernias, masses, or organomegaly.  Lymphatics: Non tender without lymphadenopathy.  Genitourinary: defer Musculoskeletal: Full ROM, 4/5 strength, and slow antalgic gait.  Skin: Warm, dry without rashes, lesions, ecchymosis. Neuro: Awake and oriented X 3,  Cranial nerves intact, reflexes equal bilaterally. Normal muscle tone, no cerebellar symptoms. Sensation intact.  Psych:  normal affect, Insight and Judgment appropriate.    Medicare Attestation I have personally reviewed: The patient's medical and social history Their use of alcohol, tobacco or illicit drugs Their current medications and supplements The patient's functional ability including ADLs,fall risks, home safety risks, cognitive, and hearing and visual impairment Diet and physical activities Evidence for depression or mood disorders  The patient's weight, height, BMI, and visual acuity have been recorded in the chart.  I have made referrals, counseling, and provided education to the patient based on review of the above and I have provided the patient with a written personalized care plan for preventive services.      Gorden Harms Tiziana Cislo 10:04 AM [facility]

## 2020-03-31 ENCOUNTER — Other Ambulatory Visit: Payer: Self-pay

## 2020-03-31 ENCOUNTER — Ambulatory Visit: Payer: Medicare HMO | Admitting: Adult Health

## 2020-03-31 ENCOUNTER — Encounter: Payer: Self-pay | Admitting: Adult Health

## 2020-03-31 ENCOUNTER — Ambulatory Visit (INDEPENDENT_AMBULATORY_CARE_PROVIDER_SITE_OTHER): Payer: Medicare HMO | Admitting: Adult Health

## 2020-03-31 VITALS — BP 110/64 | HR 88 | Temp 94.5°F | Wt 255.0 lb

## 2020-03-31 DIAGNOSIS — Z0001 Encounter for general adult medical examination with abnormal findings: Secondary | ICD-10-CM | POA: Diagnosis not present

## 2020-03-31 DIAGNOSIS — K449 Diaphragmatic hernia without obstruction or gangrene: Secondary | ICD-10-CM

## 2020-03-31 DIAGNOSIS — K760 Fatty (change of) liver, not elsewhere classified: Secondary | ICD-10-CM

## 2020-03-31 DIAGNOSIS — E559 Vitamin D deficiency, unspecified: Secondary | ICD-10-CM

## 2020-03-31 DIAGNOSIS — Z79899 Other long term (current) drug therapy: Secondary | ICD-10-CM

## 2020-03-31 DIAGNOSIS — M1990 Unspecified osteoarthritis, unspecified site: Secondary | ICD-10-CM | POA: Diagnosis not present

## 2020-03-31 DIAGNOSIS — F3340 Major depressive disorder, recurrent, in remission, unspecified: Secondary | ICD-10-CM

## 2020-03-31 DIAGNOSIS — R7309 Other abnormal glucose: Secondary | ICD-10-CM | POA: Diagnosis not present

## 2020-03-31 DIAGNOSIS — I10 Essential (primary) hypertension: Secondary | ICD-10-CM

## 2020-03-31 DIAGNOSIS — R6889 Other general symptoms and signs: Secondary | ICD-10-CM

## 2020-03-31 DIAGNOSIS — E785 Hyperlipidemia, unspecified: Secondary | ICD-10-CM | POA: Diagnosis not present

## 2020-03-31 DIAGNOSIS — K219 Gastro-esophageal reflux disease without esophagitis: Secondary | ICD-10-CM

## 2020-03-31 DIAGNOSIS — N182 Chronic kidney disease, stage 2 (mild): Secondary | ICD-10-CM | POA: Diagnosis not present

## 2020-03-31 DIAGNOSIS — E538 Deficiency of other specified B group vitamins: Secondary | ICD-10-CM | POA: Diagnosis not present

## 2020-03-31 DIAGNOSIS — R69 Illness, unspecified: Secondary | ICD-10-CM | POA: Diagnosis not present

## 2020-03-31 DIAGNOSIS — N3946 Mixed incontinence: Secondary | ICD-10-CM

## 2020-03-31 DIAGNOSIS — Z0000 Encounter for general adult medical examination without abnormal findings: Secondary | ICD-10-CM

## 2020-03-31 DIAGNOSIS — Z1211 Encounter for screening for malignant neoplasm of colon: Secondary | ICD-10-CM

## 2020-03-31 NOTE — Patient Instructions (Addendum)
Christine [name] , Thank you for taking time to come for your Medicare Wellness Visit. I appreciate your ongoing commitment to your health goals. Please review the following plan we discussed and let me know if I can assist you in the future.   This is a list of the screening recommended for you and due dates:  Health Maintenance  Topic Date Due  . Colon Cancer Screening  07/25/2019  . Pap Smear  04/28/2020  . Mammogram  07/09/2021  . Tetanus Vaccine  03/23/2027  . Flu Shot  Completed  . COVID-19 Vaccine  Completed  .  Hepatitis C: One time screening is recommended by Center for Disease Control  (CDC) for  adults born from [birth year] through 1963.   Completed  . HIV Screening  Completed  . HPV Vaccine  Aged Out    Please get a blood pressure cuff to check at home occasionally - if having low numbers and dizziness or fatigue may need to reduce med. Please let me know.   Please check with insurance about shingrix vaccine - can get at [facility]     High-Fiber Eating Plan Fiber, also called dietary fiber, is a type of carbohydrate. It is found foods such as fruits, vegetables, whole grains, and beans. A high-fiber diet can have many health benefits. Your health care provider may recommend a high-fiber diet to help:  Prevent constipation. Fiber can make your bowel movements more regular.  Lower your cholesterol.  Relieve the following conditions: ? Inflammation of veins in the anus (hemorrhoids). ? Inflammation of specific areas of the digestive tract (uncomplicated diverticulosis). ? A problem of the large intestine, also called the colon, that sometimes causes pain and diarrhea (irritable bowel syndrome, or IBS).  Prevent overeating as part of a weight-loss plan.  Prevent heart disease, type 2 diabetes, and certain cancers. What are tips for following this plan? Reading food labels  Check the nutrition facts label on food products for the amount of dietary fiber. Choose foods that have 5 grams  of fiber or more per serving.  The goals for recommended daily fiber intake include: ? Men (age 1 or younger): 34-38 g. ? Men (over age 56): 28-34 g. ? Women (age 49 or younger): 25-28 g. ? Women (over age 34): 22-25 g. Your daily fiber goal is _____________ g.   Shopping  Choose whole fruits and vegetables instead of processed forms, such as apple juice or applesauce.  Choose a wide variety of high-fiber foods such as avocados, lentils, oats, and kidney beans.  Read the nutrition facts label of the foods you choose. Be aware of foods with added fiber. These foods often have high sugar and sodium amounts per serving. Cooking  Use whole-grain flour for baking and cooking.  Cook with brown rice instead of white rice. Meal planning  Start the day with a breakfast that is high in fiber, such as a cereal that contains 5 g of fiber or more per serving.  Eat breads and cereals that are made with whole-grain flour instead of refined flour or white flour.  Eat brown rice, bulgur wheat, or millet instead of white rice.  Use beans in place of meat in soups, salads, and pasta dishes.  Be sure that half of the grains you eat each day are whole grains. General information  You can get the recommended daily intake of dietary fiber by: ? Eating a variety of fruits, vegetables, grains, nuts, and beans. ? Taking a fiber supplement if you  are not able to take in enough fiber in your diet. It is better to get fiber through food than from a supplement.  Gradually increase how much fiber you consume. If you increase your intake of dietary fiber too quickly, you may have bloating, cramping, or gas.  Drink plenty of water to help you digest fiber.  Choose high-fiber snacks, such as berries, raw vegetables, nuts, and popcorn. What foods should I eat? Fruits Berries. Pears. Apples. Oranges. Avocado. Prunes and raisins. Dried figs. Vegetables Sweet potatoes. Spinach. Kale. Artichokes. Cabbage.  Broccoli. Cauliflower. Green peas. Carrots. Squash. Grains Whole-grain breads. Multigrain cereal. Oats and oatmeal. Brown rice. Barley. Bulgur wheat. [place]. Quinoa. Bran muffins. Popcorn. Rye wafer crackers. Meats and other proteins Navy beans, kidney beans, and pinto beans. Soybeans. Split peas. Lentils. Nuts and seeds. Dairy Fiber-fortified yogurt. Beverages Fiber-fortified soy milk. Fiber-fortified orange juice. Other foods Fiber bars. The items listed above may not be a complete list of recommended foods and beverages. Contact a dietitian for more information. What foods should I avoid? Fruits Fruit juice. Cooked, strained fruit. Vegetables Fried potatoes. Canned vegetables. Well-cooked vegetables. Grains White bread. Pasta made with refined flour. White rice. Meats and other proteins Fatty cuts of meat. Fried chicken or fried fish. Dairy Milk. Yogurt. Cream cheese. Sour cream. Fats and oils Butters. Beverages Soft drinks. Other foods Cakes and pastries. The items listed above may not be a complete list of foods and beverages to avoid. Talk with your dietitian about what choices are best for you. Summary  Fiber is a type of carbohydrate. It is found in foods such as fruits, vegetables, whole grains, and beans.  A high-fiber diet has many benefits. It can help to prevent constipation, lower blood cholesterol, aid weight loss, and reduce your risk of heart disease, diabetes, and certain cancers.  Increase your intake of fiber gradually. Increasing fiber too quickly may cause cramping, bloating, and gas. Drink plenty of water while you increase the amount of fiber you consume.  The best sources of fiber include whole fruits and vegetables, whole grains, nuts, seeds, and beans. This information is not intended to replace advice given to you by your health care provider. Make sure you discuss any questions you have with your health care provider. Document Revised: 04/26/2019  Document Reviewed: 04/26/2019 Elsevier Patient Education  2021 [employer].

## 2020-04-01 ENCOUNTER — Other Ambulatory Visit: Payer: Self-pay | Admitting: Adult Health

## 2020-04-01 DIAGNOSIS — N289 Disorder of kidney and ureter, unspecified: Secondary | ICD-10-CM

## 2020-04-01 LAB — CBC WITH DIFFERENTIAL/PLATELET
Absolute Monocytes: 672 cells/uL (ref 200–950)
Basophils Absolute: 73 cells/uL (ref 0–200)
Basophils Relative: 1 %
Eosinophils Absolute: 526 cells/uL — ABNORMAL HIGH (ref 15–500)
Eosinophils Relative: 7.2 %
HCT: 40.6 % (ref 35.0–45.0)
Hemoglobin: 13 g/dL (ref 11.7–15.5)
Lymphs Abs: 1964 cells/uL (ref 850–3900)
MCH: 29.5 pg (ref 27.0–33.0)
MCHC: 32 g/dL (ref 32.0–36.0)
MCV: 92.1 fL (ref 80.0–100.0)
MPV: 10.6 fL (ref 7.5–12.5)
Monocytes Relative: 9.2 %
Neutro Abs: 4066 cells/uL (ref 1500–7800)
Neutrophils Relative %: 55.7 %
Platelets: 248 10*3/uL (ref 140–400)
RBC: 4.41 10*6/uL (ref 3.80–5.10)
RDW: 13.1 % (ref 11.0–15.0)
Total Lymphocyte: 26.9 %
WBC: 7.3 10*3/uL (ref 3.8–10.8)

## 2020-04-01 LAB — COMPLETE METABOLIC PANEL WITH GFR
AG Ratio: 1.2 (calc) (ref 1.0–2.5)
ALT: 19 U/L (ref 6–29)
AST: 22 U/L (ref 10–35)
Albumin: 4.1 g/dL (ref 3.6–5.1)
Alkaline phosphatase (APISO): 54 U/L (ref 37–153)
BUN/Creatinine Ratio: 17 (calc) (ref 6–22)
BUN: 18 mg/dL (ref 7–25)
CO2: 26 mmol/L (ref 20–32)
Calcium: 9.9 mg/dL (ref 8.6–10.4)
Chloride: 104 mmol/L (ref 98–110)
Creat: 1.07 mg/dL — ABNORMAL HIGH (ref 0.50–0.99)
GFR, Est African American: 64 mL/min/{1.73_m2} (ref >=60)
GFR, Est Non African American: 56 mL/min/{1.73_m2} — ABNORMAL LOW (ref >=60)
Globulin: 3.5 g/dL (calc) (ref 1.9–3.7)
Glucose, Bld: 91 mg/dL (ref 65–99)
Potassium: 4.5 mmol/L (ref 3.5–5.3)
Sodium: 140 mmol/L (ref 135–146)
Total Bilirubin: 0.6 mg/dL (ref 0.2–1.2)
Total Protein: 7.6 g/dL (ref 6.1–8.1)

## 2020-04-01 LAB — LIPID PANEL
Cholesterol: 108 mg/dL (ref <200)
HDL: 41 mg/dL — ABNORMAL LOW (ref >=50)
LDL Cholesterol (Calc): 44 mg/dL (calc)
Non-HDL Cholesterol (Calc): 67 mg/dL (calc) (ref <130)
Total CHOL/HDL Ratio: 2.6 (calc) (ref <5.0)
Triglycerides: 147 mg/dL (ref <150)

## 2020-04-01 LAB — TSH: TSH: 2.9 mIU/L (ref 0.40–4.50)

## 2020-04-01 LAB — MAGNESIUM: Magnesium: 2.2 mg/dL (ref 1.5–2.5)

## 2020-04-01 LAB — VITAMIN D 25 HYDROXY (VIT D DEFICIENCY, FRACTURES): Vit D, 25-Hydroxy: 82 ng/mL (ref 30–100)

## 2020-05-02 ENCOUNTER — Other Ambulatory Visit: Payer: Self-pay

## 2020-05-02 ENCOUNTER — Ambulatory Visit (INDEPENDENT_AMBULATORY_CARE_PROVIDER_SITE_OTHER): Payer: Medicare HMO | Admitting: *Deleted

## 2020-05-02 DIAGNOSIS — N289 Disorder of kidney and ureter, unspecified: Secondary | ICD-10-CM

## 2020-05-02 NOTE — Progress Notes (Signed)
Patient here for a NV to recheck a BMET and urinalysis, due to reduction in kidney functions. Patient states she has increased her fluid  Intake to 64 to 80 ounces daily. She has reduced her Meloxicam dose from 15 mg daily to 15 mg on Monday, Wednesday and Friday.

## 2020-05-03 ENCOUNTER — Other Ambulatory Visit: Payer: Self-pay | Admitting: Adult Health

## 2020-05-03 DIAGNOSIS — R319 Hematuria, unspecified: Secondary | ICD-10-CM | POA: Insufficient documentation

## 2020-05-04 LAB — URINE CULTURE
MICRO NUMBER:: 11833969
SPECIMEN QUALITY:: ADEQUATE

## 2020-05-04 LAB — BASIC METABOLIC PANEL WITH GFR
BUN/Creatinine Ratio: 24 (calc) — ABNORMAL HIGH (ref 6–22)
BUN: 24 mg/dL (ref 7–25)
CO2: 25 mmol/L (ref 20–32)
Calcium: 10 mg/dL (ref 8.6–10.4)
Chloride: 100 mmol/L (ref 98–110)
Creat: 1 mg/dL — ABNORMAL HIGH (ref 0.50–0.99)
GFR, Est African American: 69 mL/min/{1.73_m2} (ref >=60)
GFR, Est Non African American: 60 mL/min/{1.73_m2} (ref >=60)
Glucose, Bld: 81 mg/dL (ref 65–99)
Potassium: 4.4 mmol/L (ref 3.5–5.3)
Sodium: 136 mmol/L (ref 135–146)

## 2020-05-04 LAB — URINALYSIS W MICROSCOPIC + REFLEX CULTURE
Bilirubin Urine: NEGATIVE
Glucose, UA: NEGATIVE
Hyaline Cast: NONE SEEN /LPF
Ketones, ur: NEGATIVE
Nitrites, Initial: POSITIVE — AB
Protein, ur: NEGATIVE
RBC / HPF: NONE SEEN /HPF (ref 0–2)
Specific Gravity, Urine: 1.006 (ref 1.001–1.035)
pH: 6.5 (ref 5.0–8.0)

## 2020-05-04 LAB — CULTURE INDICATED

## 2020-05-05 ENCOUNTER — Encounter: Payer: Self-pay | Admitting: Adult Health

## 2020-05-05 DIAGNOSIS — R8271 Bacteriuria: Secondary | ICD-10-CM | POA: Insufficient documentation

## 2020-05-13 DIAGNOSIS — H2513 Age-related nuclear cataract, bilateral: Secondary | ICD-10-CM | POA: Diagnosis not present

## 2020-05-13 DIAGNOSIS — H52203 Unspecified astigmatism, bilateral: Secondary | ICD-10-CM | POA: Diagnosis not present

## 2020-05-26 ENCOUNTER — Other Ambulatory Visit: Payer: Self-pay

## 2020-05-26 ENCOUNTER — Other Ambulatory Visit: Payer: Self-pay | Admitting: Adult Health

## 2020-05-26 DIAGNOSIS — Z1231 Encounter for screening mammogram for malignant neoplasm of breast: Secondary | ICD-10-CM

## 2020-06-05 ENCOUNTER — Other Ambulatory Visit: Payer: Self-pay | Admitting: Internal Medicine

## 2020-06-05 DIAGNOSIS — E782 Mixed hyperlipidemia: Secondary | ICD-10-CM

## 2020-06-05 DIAGNOSIS — I10 Essential (primary) hypertension: Secondary | ICD-10-CM

## 2020-06-05 MED ORDER — ROSUVASTATIN CALCIUM 20 MG PO TABS: ORAL_TABLET | ORAL | 3 refills | Status: DC

## 2020-06-05 MED ORDER — VALSARTAN-HYDROCHLOROTHIAZIDE 320-12.5 MG PO TABS: ORAL_TABLET | ORAL | 3 refills | Status: DC

## 2020-06-19 ENCOUNTER — Other Ambulatory Visit: Payer: Self-pay | Admitting: Adult Health

## 2020-06-19 DIAGNOSIS — U071 COVID-19: Secondary | ICD-10-CM | POA: Diagnosis not present

## 2020-06-19 MED ORDER — PROMETHAZINE-DM 6.25-15 MG/5ML PO SYRP: 5.0000 mL | ORAL_SOLUTION | Freq: Four times a day (QID) | ORAL | 1 refills | Status: DC | PRN

## 2020-07-08 NOTE — Progress Notes (Signed)
Patient ID: Christine [name], female   DOB: [birth year], 65 y.o.   MRN: [id number]  COMPLETE ANNUAL PHYSICAL EXAM   Assessment and Plan:  Encounter for Annual Physical Exam with abnormal findings Due annually  Health Maintenance reviewed Healthy lifestyle reviewed and goals set  Morbid obesity ([place]) - follow up 4 months for progress monitoring - increase whole plants, decrease processed carbs - long discussion about weight loss, diet, and exercise - doing well with lifestyle changes, steady progress       -     EKG  Essential hypertension - continue medications, DASH diet, exercise and monitor at home. Call if greater than 130/80.  -     CBC with Differential/Platelet -     COMPLETE METABOLIC PANEL WITH GFR -     TSH -     UA/Microalbumin -     EKG  Hyperlipidemia, unspecified hyperlipidemia type Continue medications LDL goal <100 Continue low cholesterol diet and exercise.  Check lipid panel.  -     Lipid panel -     TSH  Medication management At each visit  Vitamin D deficiency At goal at recent check; continue to recommend supplementation for goal of 60-100 Defer vitamin D level due to recent normal check  Abnormal glucose (hx of prediabetes) Recent A1Cs at goal Discussed diet/exercise, weight management  - A1C  Depression, major, in remission ([place]) In remission off of medications; monitor Lifestyle discussed: diet/exerise, sleep hygiene, stress management, hydration  Hiatal hernia/ GERD Continue PPI/H2 blocker, diet discussed  Fatty liver Weight loss advised, avoid alcohol/tylenol, will monitor LFTs  Osteoarthritis Follows with Dr. Marlou Sa, taking tylenol/meloxicam (limited due to organ functions). Discussed topicals - voltaren, aspercreme QID, can wrap after application. Icing PRN. Continue with weight loss efforts.   Chronic bacteriuria/hematuria She is agreeable to follow up with [facility] -    No orders of the defined types were placed  in this encounter.   Over 40 min spent on chart review, critical decision making, counseling and exam. Discussed med's effects and SE's. Screening labs and tests as requested with regular follow-up as recommended.  Future Appointments  Date Time Provider [place]  07/29/2020  9:40 AM [facility] MM 3 [facility] CE  09/09/2020 11:00 AM [facility] PREVISIT RM 50 [facility] L[facility]o  09/22/2020 11:30 AM Pyrtle, Lajuan Lines, MD [facility]  10/14/2020 10:30 AM [name], MD [facility] None  03/31/2021 11:00 AM Liane Comber, NP [facility] None  07/09/2021  9:00 AM Liane Comber, NP [facility] None    Plan:   During the course of the visit the patient was educated and counseled about appropriate screening and preventive services including:   Pneumococcal vaccine  Prevnar 13 Influenza vaccine Td vaccine Screening electrocardiogram Bone densitometry screening Colorectal cancer screening Diabetes screening Glaucoma screening Nutrition counseling  Advanced directives: requested  HPI  65 y.o. female  presents for CPE and follow up. She has Hypertension; Hyperlipidemia; GERD (gastroesophageal reflux disease); Hiatal hernia; Morbid obesity ([place]); Arthritis of hip; Arthritis of shoulder region, right, degenerative; Medication management; Vitamin D deficiency; Abnormal glucose; Osteoarthritis; Recurrent major depressive disorder, in remission ([place]); B12 deficiency; CKD (chronic kidney disease) stage 2, GFR 60-89 ml/min; Bilateral knee pain; Fatty liver; Hematuria; and Bacteriuria, chronic on their problem list.  She is single, no children, has a dog.   She had covid 19 early June 2022, has recovered other than notes persistent dry cough. No other concerns today.   She has had hip and shoulder replacement with Dr.  Marlou Sa, doing well. Does have bil knee pain, arthritis, currently managing with meloxicam 15 mg every 3 days a week, tylenol PM at night. She has not tried topicals.    She has hx of MDD, recently remains in remission off of medication.   She has chronic bacteriuria and recently with microscopic hematuria, has seen uro in the past (Dr. Louis Meckel, 2020) and was recommended cranberry and probiotic supplements and follow up but never returned. She reports supplements and antibiotic didn't help. Oxybutynin is beneficial for frequency.  BMI is Body mass index is 45.47 kg/m., she has been working on diet and exercise. she is working on diet and exercise. Walking or body weight exercises depending on the weather, cutting portions, reducing sugar and flour. She is not on medications, never started phentermine. Down from peak weight 294 lb in 2020, down 5 lb since last visit.  Wt Readings from Last 3 Encounters:  07/09/20 248 lb 9.6 oz (112.8 kg)  05/02/20 253 lb 12.8 oz (115.1 kg)  03/31/20 255 lb (115.7 kg)   Her blood pressure has been controlled at home, today their BP is BP: 122/72.   She does workout. She denies chest pain, shortness of breath, dizziness.   She is on cholesterol medication, she is taking crestor 20 mg daily, heart disease brother died 9, mother with MI in her 50's. Her cholesterol is at goal. The cholesterol last visit was:  Lab Results  Component Value Date   CHOL 108 03/31/2020   HDL 41 (L) 03/31/2020   LDLCALC 44 03/31/2020   TRIG 147 03/31/2020   CHOLHDL 2.6 03/31/2020  . She has been working on diet and exercise for hx of prediabetes (A1C 5.9% in 2016), she is not on bASA, she is not on ACE/ARB and denies foot ulcerations, hyperglycemia, hypoglycemia , increased appetite, nausea, paresthesia of the feet, polydipsia, polyuria, visual disturbances, vomiting and weight loss. Last A1C in the office was:  Lab Results  Component Value Date   HGBA1C 5.4 09/21/2019    Last GFR:  Lab Results  Component Value Date   GFRNONAA 60 05/02/2020   Patient is on Vitamin D supplement,10,000.  Lab Results  Component Value Date   VD25OH 82  03/31/2020     She has reduced B12 supplement dose by 50% Lab Results  Component Value Date   VITAMINB12 >2,000 (H) 12/25/2019       Current Medications:    Current Outpatient Medications (Cardiovascular):    rosuvastatin (CRESTOR) 20 MG tablet, Take  1 tablet  Daily  for Cholesterol   valsartan-hydrochlorothiazide (DIOVAN-HCT) 320-12.5 MG tablet, Take 1 tablet Daily for BP  Current Outpatient Medications (Respiratory):    montelukast (SINGULAIR) 10 MG tablet, TAKE 1 TABLET AT BEDTIME   promethazine-dextromethorphan (PROMETHAZINE-DM) 6.25-15 MG/5ML syrup, Take 5 mLs by mouth 4 (four) times daily as needed for cough.  Current Outpatient Medications (Analgesics):    meloxicam (MOBIC) 15 MG tablet, TAKE 1/2 TO 1 TABLET DAILY WITH FOOD FOR PAIN AND     INFLAMMATION  Current Outpatient Medications (Hematological):    Cyanocobalamin (B-12) 2500 MCG SUBL, Place 1 tablet under the tongue daily.  Current Outpatient Medications (Other):    cholecalciferol (VITAMIN D) 1000 UNITS tablet, Take 1,000 Units by mouth 3 (three) times daily.    diphenhydramine-acetaminophen (TYLENOL PM) 25-500 MG TABS, Take 2 tablets by mouth at bedtime as needed.   fluconazole (DIFLUCAN) 150 MG tablet, Take 1 tablet (150 mg total) by mouth daily.   magnesium gluconate (MAGONATE)  500 MG tablet, Take 500 mg by mouth 2 (two) times daily.   oxybutynin (DITROPAN) 5 MG tablet, TAKE 1 TABLET 3 TIMES A DAY   Probiotic Product (PROBIOTIC DAILY PO), Take 1 tablet by mouth daily.   vitamin E 200 UNIT capsule, Take 200 Units by mouth daily.   phentermine (ADIPEX-P) 37.5 MG tablet, Take 1/2 to 1 tablet every Morning for Dieting & Weight Loss (Patient not taking: No sig reported)  Health Maintenance:   Immunization History  Administered Date(s) Administered   Influenza Inj Mdck Quad Pf 11/02/2016, 10/23/2019   Influenza Inj Mdck Quad With Preservative 10/23/2018   Influenza, Quadrivalent, Recombinant, Inj, Pf 10/06/2017    Influenza-Unspecified 11/22/2014, 10/31/2015, 10/06/2017   PFIZER(Purple Top)SARS-COV-2 Vaccination 04/30/2019, 05/21/2019, 11/23/2019   PPD Test 06/01/2013   Pneumococcal Polysaccharide-23 06/08/2018   Tdap 03/22/2017     TDAP 2019 Influenza 10/2019 Pneumovax 2020 Prevnar 13 at 68 Shingrix: check with insurance  Covid 19: 3/3, pfizer  Colonoscopy 2016 q 5 years- Dr. Hilarie Fredrickson, DUE - referral was placed in March, has scheduled in Sept 2022  MGM  07/2019- no family history, has scheduled 07/29/2020 DEXA 05/2013 T 0.1 PAP 2019 normal, doing here, neg HPV q 5 years  Last eye: Dr. Deon Pilling, glasses, last 05/2020 Last dental: overdue, last 3-4 years, Dr. Philipp Ovens retired, needs to schedule with Dr. Altamese [place]  Patient Care Team: [name], MD as PCP - General (Internal Medicine) Marlou Sa, Tonna Corner, MD as Consulting Physician (Orthopedic Surgery) Rolm Bookbinder, MD as Consulting Physician (Dermatology) Pyrtle, Lajuan Lines, MD as Consulting Physician (Gastroenterology)  Medical History:  Past Medical History:  Diagnosis Date   Arthritis    Asthma    Environmental allergies    GERD (gastroesophageal reflux disease)    Headache    Hiatal hernia    with stricture dilation   Hyperlipidemia    Hypertension    Kidney stones 2008   Obesity, unspecified    PONV (postoperative nausea and vomiting)    UTI (urinary tract infection)     Allergies Allergies  Allergen Reactions   Keflex [Cephalexin] Anaphylaxis and Swelling   Codeine     REACTION: nausea/vomiting   Wellbutrin [Bupropion] Diarrhea    SURGICAL HISTORY She  has a past surgical history that includes Cholecystectomy (1987); Esophageal dilation; Knee arthroscopy (Right, 2007); Total hip arthroplasty (Right, 03/19/2014); Reverse shoulder arthroplasty (Right, 06/04/2014); and Breast biopsy (Left). FAMILY HISTORY Her family history includes Cancer in her father; Heart disease in her brother and mother; Hyperlipidemia in her mother;  Hypertension in her mother; Pulmonary embolism in her brother; Stroke in her mother; Throat cancer in her father. SOCIAL HISTORY She  reports that she has never smoked. She has never used smokeless tobacco. She reports that she does not drink alcohol and does not use drugs.    Review of Systems: Review of Systems  Constitutional:  Negative for chills, fever, malaise/fatigue and weight loss.  HENT:  Negative for congestion, ear discharge, ear pain, hearing loss, sore throat and tinnitus.   Eyes: Negative.  Negative for blurred vision and double vision.  Respiratory:  Negative for cough, sputum production, shortness of breath and wheezing.   Cardiovascular:  Negative for chest pain, palpitations, orthopnea, claudication, leg swelling and PND.  Gastrointestinal:  Negative for abdominal pain, blood in stool, constipation, diarrhea, heartburn, melena, nausea and vomiting.  Genitourinary: Negative.  Negative for dysuria, flank pain, frequency, hematuria and urgency.  Musculoskeletal:  Positive for joint pain (knee pain). Negative for falls and  myalgias.  Skin: Negative.  Negative for rash.  Neurological:  Negative for dizziness, tingling, sensory change, loss of consciousness, weakness and headaches.  Endo/Heme/Allergies:  Negative for polydipsia.  Psychiatric/Behavioral: Negative.  Negative for depression, hallucinations, memory loss, substance abuse and suicidal ideas. The patient is not nervous/anxious and does not have insomnia.   All other systems reviewed and are negative.  Physical Exam: Estimated body mass index is 45.47 kg/m as calculated from the following:   Height as of this encounter: 5\' 2"  (1.575 m).   Weight as of this encounter: 248 lb 9.6 oz (112.8 kg). BP 122/72   Pulse 88   Temp (!) 97.5 F (36.4 C)   Ht 5\' 2"  (1.575 m)   Wt 248 lb 9.6 oz (112.8 kg)   LMP 10/18/2013   SpO2 97%   BMI 45.47 kg/m   General Appearance: Well nourished well developed, in no apparent  distress.  Eyes: PERRLA, EOMs, conjunctiva no swelling or erythema ENT/Mouth: Ear canals normal without obstruction, swelling, erythema, or discharge.  TMs normal bilaterally with no erythema, bulging, retraction, or loss of landmark.  Neck: Supple, thyroid normal, No bruits.  No cervical adenopathy Respiratory: Respiratory effort normal, Breath sounds clear A&P without wheeze, rhonchi, rales.   Cardio: RRR without murmurs, rubs or gallops. Brisk peripheral pulses without edema.  Chest: symmetric, with normal excursions Breasts: defer - no concerns, has upcoming mammogram  Abdomen: Morbidly obese,  Soft, nontender, no guarding, rebound, hernias, masses, or organomegaly.  Lymphatics: Non tender without lymphadenopathy.  Genitourinary: defer Musculoskeletal: No obvious deformity, ROM limited by body habitus, 4/5 strength, and slow antalgic gait.  Skin: Warm, dry without rashes, lesions, ecchymosis. Neuro: Awake and oriented X 3, Cranial nerves intact, reflexes equal bilaterally. Normal muscle tone, no cerebellar symptoms. Sensation intact.  Psych:  normal affect, Insight and Judgment appropriate.   EKG: NSR, NSCPT  The patient's weight, height and BMI have been recorded in the chart.  I have made referrals, counseling, and provided education to the patient based on review of the above and I have provided the patient with a written personalized care plan for preventive services.     Gorden Harms Eloina Ergle 9:07 AM [facility]

## 2020-07-09 ENCOUNTER — Other Ambulatory Visit: Payer: Self-pay

## 2020-07-09 ENCOUNTER — Ambulatory Visit
Admission: RE | Admit: 2020-07-09 | Discharge: 2020-07-09 | Disposition: A | Discharge status: Home / Self Care | Payer: Medicare HMO | Source: Ambulatory Visit | Attending: Adult Health | Admitting: Adult Health

## 2020-07-09 ENCOUNTER — Ambulatory Visit (INDEPENDENT_AMBULATORY_CARE_PROVIDER_SITE_OTHER): Payer: Medicare HMO | Admitting: Adult Health

## 2020-07-09 ENCOUNTER — Other Ambulatory Visit: Payer: Self-pay | Admitting: Adult Health

## 2020-07-09 ENCOUNTER — Encounter: Payer: Self-pay | Admitting: Adult Health

## 2020-07-09 VITALS — BP 122/72 | HR 88 | Temp 97.5°F | Ht 62.0 in | Wt 248.6 lb

## 2020-07-09 DIAGNOSIS — E559 Vitamin D deficiency, unspecified: Secondary | ICD-10-CM

## 2020-07-09 DIAGNOSIS — Z0000 Encounter for general adult medical examination without abnormal findings: Secondary | ICD-10-CM | POA: Diagnosis not present

## 2020-07-09 DIAGNOSIS — R059 Cough, unspecified: Secondary | ICD-10-CM

## 2020-07-09 DIAGNOSIS — E785 Hyperlipidemia, unspecified: Secondary | ICD-10-CM | POA: Diagnosis not present

## 2020-07-09 DIAGNOSIS — N182 Chronic kidney disease, stage 2 (mild): Secondary | ICD-10-CM

## 2020-07-09 DIAGNOSIS — Z79899 Other long term (current) drug therapy: Secondary | ICD-10-CM

## 2020-07-09 DIAGNOSIS — K219 Gastro-esophageal reflux disease without esophagitis: Secondary | ICD-10-CM | POA: Diagnosis not present

## 2020-07-09 DIAGNOSIS — E538 Deficiency of other specified B group vitamins: Secondary | ICD-10-CM | POA: Diagnosis not present

## 2020-07-09 DIAGNOSIS — R7309 Other abnormal glucose: Secondary | ICD-10-CM

## 2020-07-09 DIAGNOSIS — Z136 Encounter for screening for cardiovascular disorders: Secondary | ICD-10-CM | POA: Diagnosis not present

## 2020-07-09 DIAGNOSIS — Z131 Encounter for screening for diabetes mellitus: Secondary | ICD-10-CM

## 2020-07-09 DIAGNOSIS — Z1329 Encounter for screening for other suspected endocrine disorder: Secondary | ICD-10-CM

## 2020-07-09 DIAGNOSIS — R319 Hematuria, unspecified: Secondary | ICD-10-CM

## 2020-07-09 DIAGNOSIS — M1990 Unspecified osteoarthritis, unspecified site: Secondary | ICD-10-CM

## 2020-07-09 DIAGNOSIS — K760 Fatty (change of) liver, not elsewhere classified: Secondary | ICD-10-CM | POA: Diagnosis not present

## 2020-07-09 DIAGNOSIS — I10 Essential (primary) hypertension: Secondary | ICD-10-CM | POA: Diagnosis not present

## 2020-07-09 DIAGNOSIS — F3340 Major depressive disorder, recurrent, in remission, unspecified: Secondary | ICD-10-CM

## 2020-07-09 MED ORDER — VITAMIN E 180 MG (400 UNIT) PO CAPS: 400.0000 [IU] | ORAL_CAPSULE | Freq: Every day | ORAL | 0 refills | Status: DC

## 2020-07-09 NOTE — Patient Instructions (Signed)
Ms. Pho , Thank you for taking time to come for your Annual Wellness Visit. I appreciate your ongoing commitment to your health goals. Please review the following plan we discussed and let me know if I can assist you in the future.   This is a list of the screening recommended for you and due dates:  Health Maintenance  Topic Date Due   Zoster (Shingles) Vaccine (1 of 2) Never done   Colon Cancer Screening  07/25/2019   COVID-19 Vaccine (4 - Booster for Pfizer series) 09/09/2020*   Pneumococcal Vaccination (1 - PCV) 07/10/2022*   Flu Shot  08/04/2020   Mammogram  07/09/2021   Pap Smear  04/29/2022   Tetanus Vaccine  03/23/2027   Hepatitis C Screening: USPSTF Recommendation to screen - Ages 18-79 yo.  Completed   HIV Screening  Completed   HPV Vaccine  Aged Out  *Topic was postponed. The date shown is not the original due date.    Know what a healthy weight is for you (roughly BMI <25) and aim to maintain this  Aim for 7+ servings of fruits and vegetables daily  65-80+ fluid ounces of water or unsweet tea for healthy kidneys  Limit to max 1 drink of alcohol per day; avoid smoking/tobacco  Limit animal fats in diet for cholesterol and heart health - choose grass fed whenever available  Avoid highly processed foods, and foods high in saturated/trans fats Try to eat mostly unprocessed plants  Aim for low stress - take time to unwind and care for your mental health  Aim for 150 min of moderate intensity exercise weekly for heart health, and weights twice weekly for bone health  Aim for 7-9 hours of sleep daily    High-Fiber Eating Plan Fiber, also called dietary fiber, is a type of carbohydrate. It is found foods such as fruits, vegetables, whole grains, and beans. A high-fiber diet can have many health benefits. Your health care provider may recommend a high-fiber diet to help: Prevent constipation. Fiber can make your bowel movements more regular. Lower your  cholesterol. Relieve the following conditions: Inflammation of veins in the anus (hemorrhoids). Inflammation of specific areas of the digestive tract (uncomplicated diverticulosis). A problem of the large intestine, also called the colon, that sometimes causes pain and diarrhea (irritable bowel syndrome, or IBS). Prevent overeating as part of a weight-loss plan. Prevent heart disease, type 2 diabetes, and certain cancers. What are tips for following this plan? Reading food labels  Check the nutrition facts label on food products for the amount of dietary fiber. Choose foods that have 5 grams of fiber or more per serving. The goals for recommended daily fiber intake include: Men (age 29 or younger): 34-38 g. Men (over age 33): 28-34 g. Women (age 63 or younger): 25-28 g. Women (over age 69): 22-25 g. Your daily fiber goal is _____________ g. Shopping Choose whole fruits and vegetables instead of processed forms, such as apple juice or applesauce. Choose a wide variety of high-fiber foods such as avocados, lentils, oats, and kidney beans. Read the nutrition facts label of the foods you choose. Be aware of foods with added fiber. These foods often have high sugar and sodium amounts per serving. Cooking Use whole-grain flour for baking and cooking. Cook with brown rice instead of white rice. Meal planning Start the day with a breakfast that is high in fiber, such as a cereal that contains 5 g of fiber or more per serving. Eat breads and cereals  that are made with whole-grain flour instead of refined flour or white flour. Eat brown rice, bulgur wheat, or millet instead of white rice. Use beans in place of meat in soups, salads, and pasta dishes. Be sure that half of the grains you eat each day are whole grains. General information You can get the recommended daily intake of dietary fiber by: Eating a variety of fruits, vegetables, grains, nuts, and beans. Taking a fiber supplement if you  are not able to take in enough fiber in your diet. It is better to get fiber through food than from a supplement. Gradually increase how much fiber you consume. If you increase your intake of dietary fiber too quickly, you may have bloating, cramping, or gas. Drink plenty of water to help you digest fiber. Choose high-fiber snacks, such as berries, raw vegetables, nuts, and popcorn. What foods should I eat? Fruits Berries. Pears. Apples. Oranges. Avocado. Prunes and raisins. Dried figs. Vegetables Sweet potatoes. Spinach. Kale. Artichokes. Cabbage. Broccoli. Cauliflower.Green peas. Carrots. Squash. Grains Whole-grain breads. Multigrain cereal. Oats and oatmeal. Brown rice. Barley.Bulgur wheat. [place]. Quinoa. Bran muffins. Popcorn. Rye wafer crackers. Meats and other proteins Navy beans, kidney beans, and pinto beans. Soybeans. Split peas. Lentils. Nutsand seeds. Dairy Fiber-fortified yogurt. Beverages Fiber-fortified soy milk. Fiber-fortified orange juice. Other foods Fiber bars. The items listed above may not be a complete list of recommended foods and beverages. Contact a dietitian for more information. What foods should I avoid? Fruits Fruit juice. Cooked, strained fruit. Vegetables Fried potatoes. Canned vegetables. Well-cooked vegetables. Grains White bread. Pasta made with refined flour. White rice. Meats and other proteins Fatty cuts of meat. Fried chicken or fried fish. Dairy Milk. Yogurt. Cream cheese. Sour cream. Fats and oils Butters. Beverages Soft drinks. Other foods Cakes and pastries. The items listed above may not be a complete list of foods and beverages to avoid. Talk with your dietitian about what choices are best for you. Summary Fiber is a type of carbohydrate. It is found in foods such as fruits, vegetables, whole grains, and beans. A high-fiber diet has many benefits. It can help to prevent constipation, lower blood cholesterol, aid weight loss, and  reduce your risk of heart disease, diabetes, and certain cancers. Increase your intake of fiber gradually. Increasing fiber too quickly may cause cramping, bloating, and gas. Drink plenty of water while you increase the amount of fiber you consume. The best sources of fiber include whole fruits and vegetables, whole grains, nuts, seeds, and beans. This information is not intended to replace advice given to you by your health care provider. Make sure you discuss any questions you have with your healthcare provider. Document Revised: 04/26/2019 Document Reviewed: 04/26/2019 Elsevier Patient Education  2022 [employer].

## 2020-07-10 ENCOUNTER — Other Ambulatory Visit: Payer: Self-pay | Admitting: Adult Health

## 2020-07-10 LAB — URINALYSIS, ROUTINE W REFLEX MICROSCOPIC
Bilirubin Urine: NEGATIVE
Glucose, UA: NEGATIVE
Hyaline Cast: NONE SEEN /LPF
Ketones, ur: NEGATIVE
Nitrite: NEGATIVE
Protein, ur: NEGATIVE
Specific Gravity, Urine: 1.005 (ref 1.001–1.035)
Squamous Epithelial / HPF: NONE SEEN /HPF (ref <=5)
pH: 6 (ref 5.0–8.0)

## 2020-07-10 LAB — CBC WITH DIFFERENTIAL/PLATELET
Absolute Monocytes: 619 cells/uL (ref 200–950)
Basophils Absolute: 60 cells/uL (ref 0–200)
Basophils Relative: 0.7 %
Eosinophils Absolute: 387 cells/uL (ref 15–500)
Eosinophils Relative: 4.5 %
HCT: 38.2 % (ref 35.0–45.0)
Hemoglobin: 12.6 g/dL (ref 11.7–15.5)
Lymphs Abs: 1961 cells/uL (ref 850–3900)
MCH: 30.2 pg (ref 27.0–33.0)
MCHC: 33 g/dL (ref 32.0–36.0)
MCV: 91.6 fL (ref 80.0–100.0)
MPV: 10.5 fL (ref 7.5–12.5)
Monocytes Relative: 7.2 %
Neutro Abs: 5573 cells/uL (ref 1500–7800)
Neutrophils Relative %: 64.8 %
Platelets: 222 10*3/uL (ref 140–400)
RBC: 4.17 10*6/uL (ref 3.80–5.10)
RDW: 12.9 % (ref 11.0–15.0)
Total Lymphocyte: 22.8 %
WBC: 8.6 10*3/uL (ref 3.8–10.8)

## 2020-07-10 LAB — COMPLETE METABOLIC PANEL WITH GFR
AG Ratio: 1.4 (calc) (ref 1.0–2.5)
ALT: 17 U/L (ref 6–29)
AST: 20 U/L (ref 10–35)
Albumin: 4.2 g/dL (ref 3.6–5.1)
Alkaline phosphatase (APISO): 52 U/L (ref 37–153)
BUN: 17 mg/dL (ref 7–25)
CO2: 25 mmol/L (ref 20–32)
Calcium: 9.8 mg/dL (ref 8.6–10.4)
Chloride: 103 mmol/L (ref 98–110)
Creat: 0.92 mg/dL (ref 0.50–0.99)
GFR, Est African American: 77 mL/min/{1.73_m2} (ref >=60)
GFR, Est Non African American: 66 mL/min/{1.73_m2} (ref >=60)
Globulin: 3 g/dL (calc) (ref 1.9–3.7)
Glucose, Bld: 86 mg/dL (ref 65–99)
Potassium: 4.5 mmol/L (ref 3.5–5.3)
Sodium: 138 mmol/L (ref 135–146)
Total Bilirubin: 0.6 mg/dL (ref 0.2–1.2)
Total Protein: 7.2 g/dL (ref 6.1–8.1)

## 2020-07-10 LAB — HEMOGLOBIN A1C
Hgb A1c MFr Bld: 5.3 % of total Hgb (ref <5.7)
Mean Plasma Glucose: 105 mg/dL
eAG (mmol/L): 5.8 mmol/L

## 2020-07-10 LAB — LIPID PANEL
Cholesterol: 132 mg/dL (ref <200)
HDL: 44 mg/dL — ABNORMAL LOW (ref >=50)
LDL Cholesterol (Calc): 61 mg/dL (calc)
Non-HDL Cholesterol (Calc): 88 mg/dL (calc) (ref <130)
Total CHOL/HDL Ratio: 3 (calc) (ref <5.0)
Triglycerides: 209 mg/dL — ABNORMAL HIGH (ref <150)

## 2020-07-10 LAB — MICROALBUMIN / CREATININE URINE RATIO
Creatinine, Urine: 30 mg/dL (ref 20–275)
Microalb Creat Ratio: 40 mcg/mg creat — ABNORMAL HIGH (ref <30)
Microalb, Ur: 1.2 mg/dL

## 2020-07-10 LAB — MAGNESIUM: Magnesium: 2 mg/dL (ref 1.5–2.5)

## 2020-07-10 LAB — TSH: TSH: 2.28 mIU/L (ref 0.40–4.50)

## 2020-07-10 LAB — MICROSCOPIC MESSAGE

## 2020-07-10 LAB — VITAMIN B12: Vitamin B-12: >2000 pg/mL — ABNORMAL HIGH (ref 200–1100)

## 2020-07-10 MED ORDER — B-12 2500 MCG SL SUBL: 1.0000 | SUBLINGUAL_TABLET | SUBLINGUAL | Status: AC

## 2020-07-24 ENCOUNTER — Ambulatory Visit: Payer: Medicare HMO

## 2020-07-25 NOTE — Telephone Encounter (Signed)
FAXED RECORDS TO DR. PACE

## 2020-07-29 ENCOUNTER — Other Ambulatory Visit: Payer: Self-pay

## 2020-07-29 ENCOUNTER — Ambulatory Visit
Admission: RE | Admit: 2020-07-29 | Discharge: 2020-07-29 | Disposition: A | Discharge status: Home / Self Care | Payer: Medicare HMO | Source: Ambulatory Visit | Attending: Adult Health | Admitting: Adult Health

## 2020-07-29 DIAGNOSIS — Z1231 Encounter for screening mammogram for malignant neoplasm of breast: Secondary | ICD-10-CM | POA: Diagnosis not present

## 2020-07-31 ENCOUNTER — Other Ambulatory Visit: Payer: Self-pay | Admitting: Adult Health

## 2020-07-31 MED ORDER — CYCLOBENZAPRINE HCL 5 MG PO TABS: 5.0000 mg | ORAL_TABLET | Freq: Three times a day (TID) | ORAL | 0 refills | Status: DC | PRN

## 2020-08-04 DIAGNOSIS — R8279 Other abnormal findings on microbiological examination of urine: Secondary | ICD-10-CM | POA: Diagnosis not present

## 2020-08-04 DIAGNOSIS — R8271 Bacteriuria: Secondary | ICD-10-CM | POA: Diagnosis not present

## 2020-08-04 DIAGNOSIS — N200 Calculus of kidney: Secondary | ICD-10-CM | POA: Diagnosis not present

## 2020-08-11 DIAGNOSIS — N200 Calculus of kidney: Secondary | ICD-10-CM | POA: Diagnosis not present

## 2020-08-11 DIAGNOSIS — N3289 Other specified disorders of bladder: Secondary | ICD-10-CM | POA: Diagnosis not present

## 2020-08-11 DIAGNOSIS — K5730 Diverticulosis of large intestine without perforation or abscess without bleeding: Secondary | ICD-10-CM | POA: Diagnosis not present

## 2020-08-11 DIAGNOSIS — R3129 Other microscopic hematuria: Secondary | ICD-10-CM | POA: Diagnosis not present

## 2020-08-21 ENCOUNTER — Other Ambulatory Visit: Payer: Self-pay | Admitting: Adult Health

## 2020-08-22 ENCOUNTER — Telehealth: Payer: Self-pay | Admitting: *Deleted

## 2020-08-22 NOTE — Telephone Encounter (Signed)
Osvaldo Angst,  Please review last surgeries. [place] for [facility] colonoscopy with Dr.Pyrtle? Thank you, Keelin Sheridan PV

## 2020-08-25 DIAGNOSIS — R8271 Bacteriuria: Secondary | ICD-10-CM | POA: Diagnosis not present

## 2020-08-25 DIAGNOSIS — N200 Calculus of kidney: Secondary | ICD-10-CM | POA: Diagnosis not present

## 2020-09-09 ENCOUNTER — Other Ambulatory Visit: Payer: Self-pay

## 2020-09-09 ENCOUNTER — Ambulatory Visit (AMBULATORY_SURGERY_CENTER): Payer: Medicare HMO | Admitting: *Deleted

## 2020-09-09 VITALS — Ht 62.5 in | Wt 243.0 lb

## 2020-09-09 DIAGNOSIS — Z800 Family history of malignant neoplasm of digestive organs: Secondary | ICD-10-CM

## 2020-09-09 MED ORDER — PEG 3350-KCL-NA BICARB-NACL 420 G PO SOLR: 4000.0000 mL | Freq: Once | ORAL | 0 refills | Status: AC

## 2020-09-09 NOTE — Progress Notes (Signed)
Pt verified name, DOB, address and insurance during PV today.  Pt mailed instruction packet of Emmi video, copy of consent form to read and not return, and instructions.   .  Pt encouraged to call with questions or issues.  My Chart instructions to pt as well    No egg or soy allergy known to patient   issues with past sedation with any surgeries or procedures of PONV  Patient denies ever being told they had issues or difficulty with intubation  No FH of Malignant Hyperthermia No diet pills per patient No home 02 use per patient  No blood thinners per patient  Pt denies issues with constipation  No A fib or A flutter  EMMI video to pt or via [place] 19 guidelines implemented in [place] today with Pt and RN   Pt is fully vaccinated  for Covid   Due to the COVID-19 pandemic we are asking patients to follow certain guidelines.  Pt aware of COVID protocols and [facility] guidelines   Pt given the option in PV today for Golytely prep verses  alternative prep  ( Suprep/Plenvu)-  Pt is aware the Golytely has more volume but is more cost effective and the Suprep/Plenvu is less volume but may cost $90-150.  Pt voiced understanding of this and choose Golytely  Prep.

## 2020-09-12 ENCOUNTER — Encounter: Payer: Self-pay | Admitting: Internal Medicine

## 2020-09-22 ENCOUNTER — Encounter: Payer: Self-pay | Admitting: Internal Medicine

## 2020-09-22 ENCOUNTER — Ambulatory Visit (AMBULATORY_SURGERY_CENTER): Payer: Medicare HMO | Admitting: Internal Medicine

## 2020-09-22 ENCOUNTER — Other Ambulatory Visit: Payer: Self-pay

## 2020-09-22 VITALS — BP 165/87 | HR 85 | Temp 97.7°F | Resp 21 | Ht 62.0 in | Wt 243.0 lb

## 2020-09-22 DIAGNOSIS — Z800 Family history of malignant neoplasm of digestive organs: Secondary | ICD-10-CM

## 2020-09-22 DIAGNOSIS — Z86010 Personal history of colonic polyps: Secondary | ICD-10-CM

## 2020-09-22 DIAGNOSIS — Z1211 Encounter for screening for malignant neoplasm of colon: Secondary | ICD-10-CM | POA: Diagnosis not present

## 2020-09-22 MED ORDER — SODIUM CHLORIDE 0.9 % IV SOLN: 500.0000 mL | Freq: Once | INTRAVENOUS | Status: DC

## 2020-09-22 NOTE — Progress Notes (Signed)
Vss nad transferred to pacu 

## 2020-09-22 NOTE — Patient Instructions (Signed)
Resume previous diet and continue current medications. If left sided or lower abdominal pain occurs would recommend antibiotic treatment for diverticulitis. Repeat Colonoscopy in 5 years for screening purposes given family history of colon cancer.  YOU HAD AN ENDOSCOPIC PROCEDURE TODAY AT [facility]:   Refer to the procedure report that was given to you for any specific questions about what was found during the examination.  If the procedure report does not answer your questions, please call your gastroenterologist to clarify.  If you requested that your care partner not be given the details of your procedure findings, then the procedure report has been included in a sealed envelope for you to review at your convenience later.  YOU SHOULD EXPECT: Some feelings of bloating in the abdomen. Passage of more gas than usual.  Walking can help get rid of the air that was put into your GI tract during the procedure and reduce the bloating. If you had a lower endoscopy (such as a colonoscopy or flexible sigmoidoscopy) you may notice spotting of blood in your stool or on the toilet paper. If you underwent a bowel prep for your procedure, you may not have a normal bowel movement for a few days.  Please Note:  You might notice some irritation and congestion in your nose or some drainage.  This is from the oxygen used during your procedure.  There is no need for concern and it should clear up in a day or so.  SYMPTOMS TO REPORT IMMEDIATELY:  Following lower endoscopy (colonoscopy or flexible sigmoidoscopy):  Excessive amounts of blood in the stool  Significant tenderness or worsening of abdominal pains  Swelling of the abdomen that is new, acute  Fever of 100F or higher  For urgent or emergent issues, a gastroenterologist can be reached at any hour by calling [phone]. Do not use MyChart messaging for urgent concerns.    DIET:  We do recommend a small meal at first, but then you may  proceed to your regular diet.  Drink plenty of fluids but you should avoid alcoholic beverages for 24 hours.  ACTIVITY:  You should plan to take it easy for the rest of today and you should NOT DRIVE or use heavy machinery until tomorrow (because of the sedation medicines used during the test).    FOLLOW UP: Our staff will call the number listed on your records 48-72 hours following your procedure to check on you and address any questions or concerns that you may have regarding the information given to you following your procedure. If we do not reach you, we will leave a message.  We will attempt to reach you two times.  During this call, we will ask if you have developed any symptoms of COVID 19. If you develop any symptoms (ie: fever, flu-like symptoms, shortness of breath, cough etc.) before then, please call [phone].  If you test positive for Covid 19 in the 2 weeks post procedure, please call and report this information to [place].    If any biopsies were taken you will be contacted by phone or by letter within the next 1-3 weeks.  Please call us at [phone] if you have not heard about the biopsies in 3 weeks.    SIGNATURES/CONFIDENTIALITY: You and/or your care partner have signed paperwork which will be entered into your electronic medical record.  These signatures attest to the fact that that the information above on your After Visit Summary has been reviewed and is understood.  Full responsibility of the confidentiality of this discharge information lies with you and/or your care-partner.  

## 2020-09-22 NOTE — Op Note (Signed)
[facility] Patient Name: Christine [name] Procedure Date: 09/22/2020 11:34 AM MRN: [id number] Endoscopist: Jerene Bears , MD Age: 65 Referring MD:  Date of Birth: 1955/02/11 Gender: Female Account #: [id number] Procedure:                Colonoscopy Indications:              Screening in patient at increased risk: Family                            history of 1st-degree relative with colorectal                            cancer, Last colonoscopy: July 2016 Medicines:                Monitored Anesthesia Care Procedure:                Pre-Anesthesia Assessment:                           - Prior to the procedure, a History and Physical                            was performed, and patient medications and                            allergies were reviewed. The patient's tolerance of                            previous anesthesia was also reviewed. The risks                            and benefits of the procedure and the sedation                            options and risks were discussed with the patient.                            All questions were answered, and informed consent                            was obtained. Prior Anticoagulants: The patient has                            taken no previous anticoagulant or antiplatelet                            agents. ASA Grade Assessment: III - A patient with                            severe systemic disease. After reviewing the risks                            and benefits, the patient was deemed in  satisfactory condition to undergo the procedure.                           After obtaining informed consent, the colonoscope                            was passed under direct vision. Throughout the                            procedure, the patient's blood pressure, pulse, and                            oxygen saturations were monitored continuously. The                            CF [id number] was  introduced through the anus                            and advanced to the cecum, identified by                            appendiceal orifice and ileocecal valve. The                            colonoscopy was performed without difficulty. The                            patient tolerated the procedure well. The quality                            of the bowel preparation was good. The ileocecal                            valve, appendiceal orifice, and rectum were                            photographed. Scope In: 11:43:51 AM Scope Out: 11:57:51 AM Scope Withdrawal Time: 0 hours 10 minutes 46 seconds  Total Procedure Duration: 0 hours 14 minutes 0 seconds  Findings:                 The digital rectal exam was normal.                           Multiple small and large-mouthed diverticula were                            found in the sigmoid colon, descending colon,                            hepatic flexure and ascending colon. One                            diverticulum in the sigmoid had erythema and mild  purulence, query mild diverticulitis.                           Internal hemorrhoids were found during                            retroflexion. The hemorrhoids were small. Complications:            No immediate complications. Estimated Blood Loss:     Estimated blood loss: none. Impression:               - Moderate diverticulosis in the sigmoid colon, in                            the descending colon, at the hepatic flexure and in                            the ascending colon. Inflamed sigmoid diverticulum.                           - Internal hemorrhoids.                           - No specimens collected. Recommendation:           - Patient has a contact number available for                            emergencies. The signs and symptoms of potential                            delayed complications were discussed with the                             patient. Return to normal activities tomorrow.                            Written discharge instructions were provided to the                            patient.                           - Resume previous diet.                           - Continue present medications.                           - If left sided or lower abdominal occurs would                            recommend antibiotic treatment for diverticulitis.                           - Repeat colonoscopy in 5 years for screening  purposes given family history of colon cancer. Jerene Bears, MD 09/22/2020 12:02:48 PM This report has been signed electronically.

## 2020-09-22 NOTE — Progress Notes (Signed)
VS taken by DT 

## 2020-09-22 NOTE — Progress Notes (Signed)
Pt's states no medical or surgical changes since previsit or office visit. 

## 2020-09-22 NOTE — Progress Notes (Signed)
GASTROENTEROLOGY PROCEDURE H&P NOTE   Primary Care Physician: [name], MD    Reason for Procedure:  Colon cancer screening due to family history of colon cancer in the patient's mother  Plan:    Colonoscopy  Patient is appropriate for endoscopic procedure(s) in the ambulatory ([facility]) setting.  The nature of the procedure, as well as the risks, benefits, and alternatives were carefully and thoroughly reviewed with the patient. Ample time for discussion and questions allowed. The patient understood, was satisfied, and agreed to proceed.     HPI: Christine [name] is a 65 y.o. female who presents for colonoscopy.  Medical history as below.  Last colonoscopy July 2016.  Tolerated the prep well.  No complaints today including chest pain, shortness of breath or abdominal pain.  Past Medical History:  Diagnosis Date   Allergy    Arthritis    Asthma    as child but out grew   Cataract    small forming   Environmental allergies    GERD (gastroesophageal reflux disease)    Headache    Hiatal hernia    with stricture dilation   Hyperlipidemia    down on meds   Hypertension    Kidney stones 2008   again 8- 2022   Obesity, unspecified    PONV (postoperative nausea and vomiting)    UTI (urinary tract infection)     Past Surgical History:  Procedure Laterality Date   BREAST BIOPSY Left    benign   CHOLECYSTECTOMY  1987   COLONOSCOPY     ESOPHAGEAL DILATION     EYE SURGERY     to remove film over eyes   KNEE ARTHROSCOPY Right 2007   REVERSE SHOULDER ARTHROPLASTY Right 06/04/2014   Procedure: REVERSE SHOULDER ARTHROPLASTY;  Surgeon: Meredith Pel, MD;  Location: [facility];  Service: Orthopedics;  Laterality: Right;   TOTAL HIP ARTHROPLASTY Right 03/19/2014   Procedure: TOTAL HIP ARTHROPLASTY ANTERIOR APPROACH;  Surgeon: Meredith Pel, MD;  Location: [facility];  Service: Orthopedics;  Laterality: Right;   UPPER GASTROINTESTINAL ENDOSCOPY      Prior to  Admission medications   Medication Sig Start Date End Date Taking? Authorizing Provider  cholecalciferol (VITAMIN D) 1000 UNITS tablet Take 1,000 Units by mouth 3 (three) times daily.    Yes [provider]  Cyanocobalamin (B-12) 2500 MCG SUBL Place 1 tablet under the tongue once a week. 07/10/20  Yes Liane Comber, NP  diphenhydramine-acetaminophen (TYLENOL PM) 25-500 MG TABS Take 2 tablets by mouth at bedtime as needed.   Yes [provider]  magnesium gluconate (MAGONATE) 500 MG tablet Take 500 mg by mouth 2 (two) times daily.   Yes [provider]  Probiotic Product (PROBIOTIC DAILY PO) Take 1 tablet by mouth daily.   Yes [provider]  rosuvastatin (CRESTOR) 20 MG tablet Take  1 tablet  Daily  for Cholesterol 06/05/20  Yes [name], MD  valsartan-hydrochlorothiazide (DIOVAN-HCT) 320-12.5 MG tablet Take 1 tablet Daily for BP 06/05/20  Yes [name], MD  vitamin E 180 MG (400 UNITS) capsule Take 1 capsule (400 Units total) by mouth daily. 07/09/20  Yes Liane Comber, NP  cyclobenzaprine (FLEXERIL) 5 MG tablet TAKE 1 TABLET BY MOUTH THREE TIMES A DAY AS NEEDED FOR MUSCLE SPASMS 08/21/20   Liane Comber, NP  fluconazole (DIFLUCAN) 150 MG tablet Take 1 tablet (150 mg total) by mouth daily. Patient not taking: No sig reported 12/25/19   Liane Comber, NP  meloxicam (MOBIC) 15 MG tablet TAKE 1/2 TO 1 TABLET DAILY WITH FOOD FOR PAIN AND     INFLAMMATION 09/17/19   [name], MD  montelukast (SINGULAIR) 10 MG tablet TAKE 1 TABLET AT BEDTIME Patient not taking: No sig reported 09/17/19   [name], MD  oxybutynin (DITROPAN) 5 MG tablet TAKE 1 TABLET 3 TIMES A DAY 09/17/19   [name], MD  promethazine-dextromethorphan (PROMETHAZINE-DM) 6.25-15 MG/5ML syrup Take 5 mLs by mouth 4 (four) times daily as needed for cough. Patient not taking: No sig reported 06/19/20   Liane Comber, NP    Current Outpatient Medications  Medication Sig  Dispense Refill   cholecalciferol (VITAMIN D) 1000 UNITS tablet Take 1,000 Units by mouth 3 (three) times daily.      Cyanocobalamin (B-12) 2500 MCG SUBL Place 1 tablet under the tongue once a week.     diphenhydramine-acetaminophen (TYLENOL PM) 25-500 MG TABS Take 2 tablets by mouth at bedtime as needed.     magnesium gluconate (MAGONATE) 500 MG tablet Take 500 mg by mouth 2 (two) times daily.     Probiotic Product (PROBIOTIC DAILY PO) Take 1 tablet by mouth daily.     rosuvastatin (CRESTOR) 20 MG tablet Take  1 tablet  Daily  for Cholesterol 90 tablet 3   valsartan-hydrochlorothiazide (DIOVAN-HCT) 320-12.5 MG tablet Take 1 tablet Daily for BP 90 tablet 3   vitamin E 180 MG (400 UNITS) capsule Take 1 capsule (400 Units total) by mouth daily. 90 capsule 0   cyclobenzaprine (FLEXERIL) 5 MG tablet TAKE 1 TABLET BY MOUTH THREE TIMES A DAY AS NEEDED FOR MUSCLE SPASMS 60 tablet 2   fluconazole (DIFLUCAN) 150 MG tablet Take 1 tablet (150 mg total) by mouth daily. (Patient not taking: No sig reported) 3 tablet 1   meloxicam (MOBIC) 15 MG tablet TAKE 1/2 TO 1 TABLET DAILY WITH FOOD FOR PAIN AND     INFLAMMATION 90 tablet 3   montelukast (SINGULAIR) 10 MG tablet TAKE 1 TABLET AT BEDTIME (Patient not taking: No sig reported) 90 tablet 3   oxybutynin (DITROPAN) 5 MG tablet TAKE 1 TABLET 3 TIMES A DAY 270 tablet 3   promethazine-dextromethorphan (PROMETHAZINE-DM) 6.25-15 MG/5ML syrup Take 5 mLs by mouth 4 (four) times daily as needed for cough. (Patient not taking: No sig reported) 240 mL 1   Current Facility-Administered Medications  Medication Dose Route Frequency Provider Last Rate Last Admin   0.9 %  sodium chloride infusion  500 mL Intravenous Once Hiilani Jetter, Lajuan Lines, MD        Allergies as of 09/22/2020 - Review Complete 09/22/2020  Allergen Reaction Noted   Keflex [cephalexin] Anaphylaxis and Swelling 05/29/2014   Codeine  12/11/2008   Wellbutrin [bupropion] Diarrhea 12/25/2019    Family History   Problem Relation Age of Onset   Colon cancer Mother    Stroke Mother    Hypertension Mother    Hyperlipidemia Mother    Heart disease Mother    Esophageal cancer Father    Throat cancer Father    Cancer Father        throat   Pulmonary embolism Brother    Heart disease Brother    Colon polyps Neg Hx    Rectal cancer Neg Hx    Stomach cancer Neg Hx     Social History   Socioeconomic History   Marital status: Single    Spouse name: Not on file   Number of children: Not on file   Years  of education: Not on file   Highest education level: Not on file  Occupational History   Not on file  Tobacco Use   Smoking status: Never   Smokeless tobacco: Never  Vaping Use   Vaping Use: Never used  Substance and Sexual Activity   Alcohol use: No    Alcohol/week: 0.0 standard drinks   Drug use: No   Sexual activity: Never  Other Topics Concern   Not on file  Social History Narrative   Not on file   Social Determinants of Health   Financial Resource Strain: Not on file  Food Insecurity: Not on file  Transportation Needs: Not on file  Physical Activity: Not on file  Stress: Not on file  Social Connections: Not on file  Intimate Partner Violence: Not on file    Physical Exam: Vital signs in last 24 hours: '@BP'$  140/76   Pulse 80   Temp 97.7 F (36.5 C)   Ht '5\' 2"'$  (1.575 m)   Wt 243 lb (110.2 kg)   LMP 10/18/2013   SpO2 97%   BMI 44.45 kg/m  GEN: NAD EYE: Sclerae anicteric ENT: MMM CV: Non-tachycardic Pulm: CTA b/l GI: Soft, NT/ND NEURO:  Alert & Oriented x 3   Zenovia Jarred, MD [facility]  09/22/2020 11:30 AM

## 2020-09-24 ENCOUNTER — Telehealth: Payer: Self-pay | Admitting: *Deleted

## 2020-09-24 ENCOUNTER — Telehealth: Payer: Self-pay

## 2020-09-24 NOTE — Telephone Encounter (Signed)
Attempted follow up call. No answer, left VM

## 2020-09-24 NOTE — Telephone Encounter (Signed)
  Follow up Call-  Call back number 09/22/2020  Post procedure Call Back phone  # [phone]  Permission to leave phone message Yes  Some recent data might be hidden   LMOM to call back with any questions or concerns.  Also, call back if patient has developed fever, respiratory issues or been dx with COVID or had any family members or close contacts diagnosed since her procedure.

## 2020-10-02 ENCOUNTER — Other Ambulatory Visit: Payer: Self-pay | Admitting: Adult Health

## 2020-10-02 MED ORDER — CYCLOBENZAPRINE HCL 10 MG PO TABS: ORAL_TABLET | ORAL | 1 refills | Status: DC

## 2020-10-06 DIAGNOSIS — N200 Calculus of kidney: Secondary | ICD-10-CM | POA: Diagnosis not present

## 2020-10-13 ENCOUNTER — Encounter: Payer: Self-pay | Admitting: Internal Medicine

## 2020-10-13 NOTE — Progress Notes (Signed)
Future Appointments  Date Time Provider [facility]  10/14/2020 10:30 AM [name], MD [facility] None  03/31/2021       -     Wellness  11:00 AM Liane Comber, NP [facility] None  07/09/2021          -     CPE  9:00 AM Liane Comber, NP [facility] None    History of Present Illness:      This very nice 65 y.o. single Christine [name]  presents for 6 month follow up with HTN, HLD, Pre-Diabetes and Vitamin D Deficiency. Patient also has GERD controlled on her meds.            Note patient has Morbid Obesity (Wt 239# /BMI 43.86 with 55# weight loss from 294# July 2020)  which she attributes to better eating choices.         Patient is treated for HTN (2000) & BP has been controlled at home. Today's BP is at goal. 140/82. Patient has had no complaints of any cardiac type chest pain, palpitations, dyspnea / orthopnea / PND, dizziness, claudication, or dependent edema.       Hyperlipidemia is controlled with diet & Rosuvastatin. Patient denies myalgias or other med SE's. Last Lipids were at goal except elevated Trig's:  Lab Results  Component Value Date   CHOL 132 07/09/2020   HDL 44 (L) 07/09/2020   LDLCALC 61 07/09/2020   TRIG 209 (H) 07/09/2020   CHOLHDL 3.0 07/09/2020     Also, the patient has Morbid Obesity (BMI 45+) and  PreDiabetes (A1c 5.7% /2015 & 5.9% /2016)  and has had no symptoms of reactive hypoglycemia, diabetic polys, paresthesias or visual blurring.  Last A1c was normal & at goal:  Lab Results  Component Value Date   HGBA1C 5.3 07/09/2020                                                         Further, the patient also has history of Vitamin D Deficiency ("11"/2008) and supplements vitamin D without any suspected side-effects. Last vitamin D was at goal:  Lab Results  Component Value Date   VD25OH 82 03/31/2020     Current Outpatient Medications on File Prior to Visit  Medication Sig   VITAMIN D 1000 UNITS tablet Take  3  times daily.    B-12 2500  MCG SUBL Place 1 tablet under the tongue once a week.   cyclobenzaprine 10 MG tablet TAKE 1/2 1 TABLET  3 x/day  DAY AS NEEDED FOR MUSCLE SPASMS   diphenhydramine-apap 25-500 MG TABS Take 2 tablets  at bedtime as needed.   Magnesium 500 MG tablet Take 2 (two) times daily.   meloxicam  15 MG tablet TAKE 1/2 TO 1 TABLET DAILY WITH FOOD    oxybutynin (DITROPAN) 5 MG tablet TAKE 1 TABLET 3 TIMES A DAY   Probiotic Product  Take 1 tablet  daily.   rosuvastatin Christine MG tablet Take  1 tablet  Daily  for Cholesterol   valsartan-hctz 320-12.5 MG tablet Take 1 tablet Daily for BP   vitamin E 180 MG  / 400 UNITS Take 1 capsule (400 Units total) by mouth daily.     Allergies  Allergen Reactions   Keflex [Cephalexin] Anaphylaxis and Swelling   Codeine  REACTION: nausea/vomiting   Wellbutrin [Bupropion] Diarrhea     PMHx:   Past Medical History:  Diagnosis Date   Allergy    Arthritis    Asthma    as child but out grew   Cataract    small forming   Environmental allergies    GERD (gastroesophageal reflux disease)    Headache    Hiatal hernia    with stricture dilation   Hyperlipidemia    down on meds   Hypertension    Kidney stones 2008   again 8- 2022   Obesity, unspecified    PONV (postoperative nausea and vomiting)    UTI (urinary tract infection)      Immunization History  Administered Date(s) Administered   Influenza Inj Mdck Quad  11/02/2016, 10/23/2019   Influenza Inj Mdck Quad  10/23/2018   Influenza, Quadrivalent 10/06/2017   Influenza 11/22/2014, 10/31/2015, 10/06/2017   PFIZER SARS-COV-2 Vacc 04/30/2019, 05/21/2019, 11/23/2019   PPD Test 06/01/2013   Pneumococcal-23 06/08/2018   Tdap 03/22/2017     Past Surgical History:  Procedure Laterality Date   BREAST BIOPSY Left    benign   CHOLECYSTECTOMY  1987   COLONOSCOPY     ESOPHAGEAL DILATION     EYE SURGERY     to remove film over eyes   KNEE ARTHROSCOPY Right 2007   REVERSE SHOULDER ARTHROPLASTY Right  06/04/2014   REVERSE SHOULDER ARTHROPLASTY; Meredith Pel, MD   TOTAL HIP ARTHROPLASTY Right 03/19/2014   TOTAL HIP ARTHROPLASTY ANTERIOR APPROACH; Meredith Pel, MD   UPPER GASTROINTESTINAL ENDOSCOPY      FHx:    Reviewed / unchanged  SHx:    Reviewed / unchanged   Systems Review:  Constitutional: Denies fever, chills, wt changes, headaches, insomnia, fatigue, night sweats, change in appetite. Eyes: Denies redness, blurred vision, diplopia, discharge, itchy, watery eyes.  ENT: Denies discharge, congestion, post nasal drip, epistaxis, sore throat, earache, hearing loss, dental pain, tinnitus, vertigo, sinus pain, snoring.  CV: Denies chest pain, palpitations, irregular heartbeat, syncope, dyspnea, diaphoresis, orthopnea, PND, claudication or edema. Respiratory: denies cough, dyspnea, DOE, pleurisy, hoarseness, laryngitis, wheezing.  Gastrointestinal: Denies dysphagia, odynophagia, heartburn, reflux, water brash, abdominal pain or cramps, nausea, vomiting, bloating, diarrhea, constipation, hematemesis, melena, hematochezia  or hemorrhoids. Genitourinary: Denies dysuria, frequency, urgency, nocturia, hesitancy, discharge, hematuria or flank pain. Musculoskeletal: Denies arthralgias, myalgias, stiffness, jt. swelling, pain, limping or strain/sprain.  Skin: Denies pruritus, rash, hives, warts, acne, eczema or change in skin lesion(s). Neuro: No weakness, tremor, incoordination, spasms, paresthesia or pain. Psychiatric: Denies confusion, memory loss or sensory loss. Endo: Denies change in weight, skin or hair change.  Heme/Lymph: No excessive bleeding, bruising or enlarged lymph nodes.  Physical Exam  BP 140/82   Pulse 81   Temp 97.8 F (36.6 C)   Resp 16   Ht 5\' 2"  (1.575 m)   Wt 239 lb 12.8 oz (108.8 kg)   LMP 10/18/2013   SpO2 94%   BMI 43.86 kg/m   Appears  well nourished, well groomed  and in no distress.  Eyes: PERRLA, EOMs, conjunctiva no swelling or  erythema. Sinuses: No frontal/maxillary tenderness ENT/Mouth: EAC's clear, TM's nl w/o erythema, bulging. Nares clear w/o erythema, swelling, exudates. Oropharynx clear without erythema or exudates. Oral hygiene is good. Tongue normal, non obstructing. Hearing intact.  Neck: Supple. Thyroid not palpable. Car 2+/2+ without bruits, nodes or JVD. Chest: Respirations nl with BS clear & equal w/o rales, rhonchi, wheezing or stridor.  Cor: Heart sounds  normal w/ regular rate and rhythm without sig. murmurs, gallops, clicks or rubs. Peripheral pulses normal and equal  without edema.  Abdomen: Soft & bowel sounds normal. Non-tender w/o guarding, rebound, hernias, masses or organomegaly.  Lymphatics: Unremarkable.  Musculoskeletal: Full ROM all peripheral extremities, joint stability, 5/5 strength and normal gait.  Skin: Warm, dry without exposed rashes, lesions or ecchymosis apparent.  Neuro: Cranial nerves intact, reflexes equal bilaterally. Sensory-motor testing grossly intact. Tendon reflexes grossly intact.  Pysch: Alert & oriented x 3.  Insight and judgement nl & appropriate. No ideations.  Assessment and Plan:  1. Essential hypertension  - Continue medication, monitor blood pressure at home.  - Continue DASH diet.  Reminder to go to the ER if any CP,  SOB, nausea, dizziness, severe HA, changes vision/speech.    - CBC with Differential/Platelet - COMPLETE METABOLIC PANEL WITH GFR - Magnesium - TSH  2. Hyperlipidemia, mixed  - Lipid panel - TSH  3. Abnormal glucose  - Continue diet, exercise  - Lifestyle modifications.  - Monitor appropriate labs   - Fructosamine - Insulin, random  4. Vitamin D deficiency  - Continue supplementation   - VITAMIN D 25 Hydroxy   5. Fatty liver  - COMPLETE METABOLIC PANEL WITH GFR  6. Morbid obesity with BMI of 45.0-49.9, adult (HCC)  - COMPLETE METABOLIC PANEL WITH GFR - Lipid panel - Fructosamine  7. Medication management  - CBC  with Differential/Platelet - COMPLETE METABOLIC PANEL WITH GFR - Magnesium - Lipid panel - TSH - Fructosamine - Insulin, random - VITAMIN D 25 Hydroxy          Discussed  regular exercise, BP monitoring, weight control to achieve/maintain BMI less than 25 and discussed med and SE's. Recommended labs to assess and monitor clinical status with further disposition pending results of labs.  I discussed the assessment and treatment plan with the patient. The patient was provided an opportunity to ask questions and all were answered. The patient agreed with the plan and demonstrated an understanding of the instructions.  I provided over 30 minutes of exam, counseling, chart review and  complex critical decision making.        The patient was advised to call back or seek an in-person evaluation if the symptoms worsen or if the condition fails to improve as anticipated.   Kirtland Bouchard, MD

## 2020-10-13 NOTE — Patient Instructions (Signed)

## 2020-10-14 ENCOUNTER — Other Ambulatory Visit: Payer: Self-pay | Admitting: Urology

## 2020-10-14 ENCOUNTER — Ambulatory Visit (INDEPENDENT_AMBULATORY_CARE_PROVIDER_SITE_OTHER): Payer: Medicare HMO | Admitting: Internal Medicine

## 2020-10-14 ENCOUNTER — Encounter: Payer: Self-pay | Admitting: Internal Medicine

## 2020-10-14 ENCOUNTER — Other Ambulatory Visit: Payer: Self-pay

## 2020-10-14 DIAGNOSIS — R7309 Other abnormal glucose: Secondary | ICD-10-CM

## 2020-10-14 DIAGNOSIS — K760 Fatty (change of) liver, not elsewhere classified: Secondary | ICD-10-CM

## 2020-10-14 DIAGNOSIS — Z23 Encounter for immunization: Secondary | ICD-10-CM | POA: Diagnosis not present

## 2020-10-14 DIAGNOSIS — E782 Mixed hyperlipidemia: Secondary | ICD-10-CM

## 2020-10-14 DIAGNOSIS — I10 Essential (primary) hypertension: Secondary | ICD-10-CM | POA: Diagnosis not present

## 2020-10-14 DIAGNOSIS — Z79899 Other long term (current) drug therapy: Secondary | ICD-10-CM | POA: Diagnosis not present

## 2020-10-14 DIAGNOSIS — E559 Vitamin D deficiency, unspecified: Secondary | ICD-10-CM

## 2020-10-14 DIAGNOSIS — Z6841 Body Mass Index (BMI) 40.0 and over, adult: Secondary | ICD-10-CM

## 2020-10-15 NOTE — Progress Notes (Signed)
============================================================ -   Test results slightly outside the reference range are not unusual. If there is anything important, I will review this with you,  otherwise it is considered normal test values.  If you have further questions,  please do not hesitate to contact me at the office or via My Chart.  ============================================================ ============================================================  -  Total Chol = 127    & LDL Chol = 59  - Bothj  Excellent   - Very low risk for Heart Attack  / Stroke ============================================================ ============================================================  -  Vitamin D = 65 - Excellent  ============================================================ ============================================================  -  All Else - CBC - Kidneys - Electrolytes - Liver - Magnesium & Thyroid    - all  Normal / OK ============================================================ ============================================================  -  Keep up the [place] Work  !  ============================================================ ============================================================

## 2020-10-19 LAB — CBC WITH DIFFERENTIAL/PLATELET
Absolute Monocytes: 461 cells/uL (ref 200–950)
Basophils Absolute: 90 cells/uL (ref 0–200)
Basophils Relative: 1.4 %
Eosinophils Absolute: 493 cells/uL (ref 15–500)
Eosinophils Relative: 7.7 %
HCT: 43.8 % (ref 35.0–45.0)
Hemoglobin: 14.1 g/dL (ref 11.7–15.5)
Lymphs Abs: 1709 cells/uL (ref 850–3900)
MCH: 29.8 pg (ref 27.0–33.0)
MCHC: 32.2 g/dL (ref 32.0–36.0)
MCV: 92.6 fL (ref 80.0–100.0)
MPV: 10.8 fL (ref 7.5–12.5)
Monocytes Relative: 7.2 %
Neutro Abs: 3648 cells/uL (ref 1500–7800)
Neutrophils Relative %: 57 %
Platelets: 214 10*3/uL (ref 140–400)
RBC: 4.73 10*6/uL (ref 3.80–5.10)
RDW: 13.4 % (ref 11.0–15.0)
Total Lymphocyte: 26.7 %
WBC: 6.4 10*3/uL (ref 3.8–10.8)

## 2020-10-19 LAB — VITAMIN D 25 HYDROXY (VIT D DEFICIENCY, FRACTURES): Vit D, 25-Hydroxy: 65 ng/mL (ref 30–100)

## 2020-10-19 LAB — COMPLETE METABOLIC PANEL WITH GFR
AG Ratio: 1.3 (calc) (ref 1.0–2.5)
ALT: 20 U/L (ref 6–29)
AST: 22 U/L (ref 10–35)
Albumin: 4.3 g/dL (ref 3.6–5.1)
Alkaline phosphatase (APISO): 66 U/L (ref 37–153)
BUN: 19 mg/dL (ref 7–25)
CO2: 27 mmol/L (ref 20–32)
Calcium: 9.9 mg/dL (ref 8.6–10.4)
Chloride: 102 mmol/L (ref 98–110)
Creat: 0.9 mg/dL (ref 0.50–1.05)
Globulin: 3.2 g/dL (calc) (ref 1.9–3.7)
Glucose, Bld: 78 mg/dL (ref 65–99)
Potassium: 4.3 mmol/L (ref 3.5–5.3)
Sodium: 140 mmol/L (ref 135–146)
Total Bilirubin: 0.6 mg/dL (ref 0.2–1.2)
Total Protein: 7.5 g/dL (ref 6.1–8.1)
eGFR: 72 mL/min/{1.73_m2} (ref >=60)

## 2020-10-19 LAB — LIPID PANEL
Cholesterol: 127 mg/dL (ref <200)
HDL: 43 mg/dL — ABNORMAL LOW (ref >=50)
LDL Cholesterol (Calc): 59 mg/dL (calc)
Non-HDL Cholesterol (Calc): 84 mg/dL (calc) (ref <130)
Total CHOL/HDL Ratio: 3 (calc) (ref <5.0)
Triglycerides: 170 mg/dL — ABNORMAL HIGH (ref <150)

## 2020-10-19 LAB — FRUCTOSAMINE: Fructosamine: 250 umol/L (ref 205–285)

## 2020-10-19 LAB — MAGNESIUM: Magnesium: 1.9 mg/dL (ref 1.5–2.5)

## 2020-10-19 LAB — TSH: TSH: 1.97 mIU/L (ref 0.40–4.50)

## 2020-10-19 LAB — INSULIN, RANDOM: Insulin: 22.2 u[IU]/mL — ABNORMAL HIGH

## 2020-10-20 NOTE — Progress Notes (Addendum)
COVID swab appointment:n/a  COVID Vaccine Completed: yes x3 Date COVID Vaccine completed: 04/30/19, 05/21/19 Has received booster: 11/23/19 COVID vaccine manufacturer: Pfizer      Date of COVID positive in last 90 days: pos June 16th  PCP - [name], MD Cardiologist - n/a  Chest x-ray - 07/09/20 Epic EKG - 07/09/20 Epic Stress Test - n/a ECHO - n/a Cardiac Cath - n/a Pacemaker/ICD device last checked: n/a Spinal Cord Stimulator: n/a  Sleep Study - n/a CPAP -   Fasting Blood Sugar - pre, doesn't take any meds and no BS checks at home Checks Blood Sugar _____ times a day  Blood Thinner Instructions: n/a Aspirin Instructions: Last Dose:  Activity level: Can go up a flight of stairs and perform activities of daily living without stopping and without symptoms of chest pain or shortness of breath. Trouble with stairs due to knees       Anesthesia review: HTN, fatty liver, asthma, PONV, pre DM  Patient denies shortness of breath, fever, cough and chest pain at PAT appointment   Patient verbalized understanding of instructions that were given to them at the PAT appointment. Patient was also instructed that they will need to review over the PAT instructions again at home before surgery.

## 2020-10-20 NOTE — Patient Instructions (Addendum)
DUE TO COVID-19 ONLY ONE VISITOR IS ALLOWED TO COME WITH YOU AND STAY IN THE WAITING ROOM ONLY DURING PRE OP AND PROCEDURE.   **NO VISITORS ARE ALLOWED IN THE SHORT STAY AREA OR RECOVERY ROOM!!**       Your procedure is scheduled on: 10/28/20   Report to [facility] Main Entrance    Report to admitting at 8:15 AM   Call this number if you have problems the morning of surgery [phone]   Do not eat food :After Midnight.   May have liquids until 7:30 AM day of surgery  CLEAR LIQUID DIET  Foods Allowed                                                                     Foods Excluded  Water, Black Coffee and tea (no milk or creamer)           liquids that you cannot  Plain Jell-O in any flavor  (No red)                                    see through such as: Fruit ices (not with fruit pulp)                                            milk, soups, orange juice              Iced Popsicles (No red)                                                All solid food                                   Apple juices Sports drinks like Gatorade (No red) Lightly seasoned clear broth or consume(fat free) Sugar   Oral Hygiene is also important to reduce your risk of infection.                                    Remember - BRUSH YOUR TEETH THE MORNING OF SURGERY WITH YOUR REGULAR TOOTHPASTE   Take these medicines the morning of surgery with A SIP OF WATER: Singulair, Ditropan, Crestor                              You may not have any metal on your body including hair pins, jewelry, and body piercing             Do not wear make-up, lotions, powders, perfumes, or deodorant  Do not wear nail polish including gel and S&S, artificial/acrylic nails, or any other type of covering on natural nails including finger and toenails. If you have artificial nails, gel coating, etc. that needs to  be removed by a nail salon please have this removed prior to surgery or surgery may need to be canceled/  delayed if the surgeon/ anesthesia feels like they are unable to be safely monitored.   Do not shave  48 hours prior to surgery.               Do not bring valuables to the hospital. [place].    Patients discharged on the day of surgery will not be allowed to drive home.   Please read over the following fact sheets you were given: IF YOU HAVE QUESTIONS ABOUT YOUR PRE-OP INSTRUCTIONS PLEASE CALL [place] - Preparing for Surgery Before surgery, you can play an important role.  Because skin is not sterile, your skin needs to be as free of germs as possible.  You can reduce the number of germs on your skin by washing with CHG (chlorahexidine gluconate) soap before surgery.  CHG is an antiseptic cleaner which kills germs and bonds with the skin to continue killing germs even after washing. Please DO NOT use if you have an allergy to CHG or antibacterial soaps.  If your skin becomes reddened/irritated stop using the CHG and inform your nurse when you arrive at Short Stay. Do not shave (including legs and underarms) for at least 48 hours prior to the first CHG shower.  You may shave your face/neck.  Please follow these instructions carefully:  1.  Shower with CHG Soap the night before surgery and the  morning of surgery.  2.  If you choose to wash your hair, wash your hair first as usual with your normal  shampoo.  3.  After you shampoo, rinse your hair and body thoroughly to remove the shampoo.                             4.  Use CHG as you would any other liquid soap.  You can apply chg directly to the skin and wash.  Gently with a scrungie or clean washcloth.  5.  Apply the CHG Soap to your body ONLY FROM THE NECK DOWN.   Do   not use on face/ open                           Wound or open sores. Avoid contact with eyes, ears mouth and   genitals (private parts).                       Wash face,  Genitals (private parts)  with your normal soap.             6.  Wash thoroughly, paying special attention to the area where your    surgery  will be performed.  7.  Thoroughly rinse your body with warm water from the neck down.  8.  DO NOT shower/wash with your normal soap after using and rinsing off the CHG Soap.                9.  Pat yourself dry with a clean towel.            10.  Wear clean pajamas.            11.  Place clean sheets on  your bed the night of your first shower and do not  sleep with pets. Day of Surgery : Do not apply any lotions/deodorants the morning of surgery.  Please wear clean clothes to the hospital/surgery center.  FAILURE TO FOLLOW THESE INSTRUCTIONS MAY RESULT IN THE CANCELLATION OF YOUR SURGERY  PATIENT SIGNATURE_________________________________  NURSE SIGNATURE__________________________________  ________________________________________________________________________

## 2020-10-21 ENCOUNTER — Encounter (HOSPITAL_COMMUNITY)
Admission: RE | Admit: 2020-10-21 | Discharge: 2020-10-21 | Disposition: A | Discharge status: Home / Self Care | Payer: Medicare HMO | Source: Ambulatory Visit | Attending: Urology | Admitting: Urology

## 2020-10-21 ENCOUNTER — Encounter (HOSPITAL_COMMUNITY): Payer: Self-pay

## 2020-10-21 DIAGNOSIS — I10 Essential (primary) hypertension: Secondary | ICD-10-CM | POA: Diagnosis not present

## 2020-10-21 DIAGNOSIS — Z01812 Encounter for preprocedural laboratory examination: Secondary | ICD-10-CM | POA: Diagnosis not present

## 2020-10-21 DIAGNOSIS — Z79899 Other long term (current) drug therapy: Secondary | ICD-10-CM | POA: Diagnosis not present

## 2020-10-21 DIAGNOSIS — N200 Calculus of kidney: Secondary | ICD-10-CM | POA: Diagnosis not present

## 2020-10-21 DIAGNOSIS — K219 Gastro-esophageal reflux disease without esophagitis: Secondary | ICD-10-CM | POA: Diagnosis not present

## 2020-10-21 HISTORY — DX: Dizziness and giddiness: R42

## 2020-10-21 HISTORY — DX: Anemia, unspecified: D64.9

## 2020-10-21 LAB — HEMOGLOBIN A1C
Hgb A1c MFr Bld: 5.4 % (ref 4.8–5.6)
Mean Plasma Glucose: 108.28 mg/dL

## 2020-10-21 LAB — GLUCOSE, CAPILLARY: Glucose-Capillary: 92 mg/dL (ref 70–99)

## 2020-10-22 NOTE — Progress Notes (Signed)
Anesthesia Chart Review   Case: [id number] Date/Time: 10/28/20 1015   Procedures:      CYSTOSCOPY WITH RETROGRADE PYELOGRAM, URETEROSCOPY AND STENT PLACEMENT (Bilateral) - 1 HR     HOLMIUM LASER APPLICATION (Bilateral)   Anesthesia type: Choice   Pre-op diagnosis: BILATERAL RENAL STONES   Location: [facility] PROCED[facility] / [facility]   Surgeons: Robley Fries, MD       DISCUSSION:65 y.o. never smoker with h/o PONV, HTN, GERD, bilateral kidney stones scheduled for above procedure 10/28/2020 with Dr. Jacalyn Lefevre.   Pt last seen by PCP 10/14/20, stable at this visit.  VS: BP (!) 165/90   Pulse 73   Temp 36.7 C (Oral)   Resp 16   Ht 5\' 2"  (1.575 m)   Wt 109.8 kg   LMP 10/18/2013   SpO2 98%   BMI 44.26 kg/m   PROVIDERS: [name], MD is PCP    LABS: Labs reviewed: Acceptable for surgery. (all labs ordered are listed, but only abnormal results are displayed)  Labs Reviewed  HEMOGLOBIN A1C  GLUCOSE, CAPILLARY     IMAGES:   EKG: 07/09/2020 Rate 84 bpm  NSR  CV:  Past Medical History:  Diagnosis Date   Allergy    Anemia    Arthritis    Asthma    as child but out grew   Cataract    small forming   Environmental allergies    GERD (gastroesophageal reflux disease)    Headache    Hiatal hernia    with stricture dilation   Hyperlipidemia    down on meds   Hypertension    Kidney stones 2008   again 8- 2022   Obesity, unspecified    PONV (postoperative nausea and vomiting)    UTI (urinary tract infection)    Vertigo     Past Surgical History:  Procedure Laterality Date   BREAST BIOPSY Left    benign   CHOLECYSTECTOMY  1987   COLONOSCOPY     ESOPHAGEAL DILATION     EYE SURGERY     to remove film over eyes   KNEE ARTHROSCOPY Right 2007   REVERSE SHOULDER ARTHROPLASTY Right 06/04/2014   Procedure: REVERSE SHOULDER ARTHROPLASTY;  Surgeon: Meredith Pel, MD;  Location: [place];  Service: Orthopedics;  Laterality: Right;   TOTAL HIP ARTHROPLASTY  Right 03/19/2014   Procedure: TOTAL HIP ARTHROPLASTY ANTERIOR APPROACH;  Surgeon: Meredith Pel, MD;  Location: [place];  Service: Orthopedics;  Laterality: Right;   UPPER GASTROINTESTINAL ENDOSCOPY     WISDOM TOOTH EXTRACTION      MEDICATIONS:  Cholecalciferol (VITAMIN D) 125 MCG (5000 UT) CAPS   Cyanocobalamin (B-12) 2500 MCG SUBL   cyclobenzaprine (FLEXERIL) 10 MG tablet   diphenhydramine-acetaminophen (TYLENOL PM) 25-500 MG TABS   magnesium gluconate (MAGONATE) 500 MG tablet   meloxicam (MOBIC) 15 MG tablet   montelukast (SINGULAIR) 10 MG tablet   oxybutynin (DITROPAN) 5 MG tablet   Probiotic Product (PROBIOTIC DAILY PO)   rosuvastatin (CRESTOR) 20 MG tablet   valsartan-hydrochlorothiazide (DIOVAN-HCT) 320-12.5 MG tablet   vitamin E 180 MG (400 UNITS) capsule    0.9 %  sodium chloride infusion      Kendria Halberg Ward, PA-C [facility] Pre-Surgical Testing

## 2020-10-28 ENCOUNTER — Encounter (HOSPITAL_COMMUNITY)
Admission: RE | Disposition: A | Discharge status: Home / Self Care | Payer: Self-pay | Source: Home / Self Care | Attending: Urology

## 2020-10-28 ENCOUNTER — Encounter (HOSPITAL_COMMUNITY): Payer: Self-pay | Admitting: Urology

## 2020-10-28 ENCOUNTER — Ambulatory Visit (HOSPITAL_COMMUNITY)
Admission: RE | Admit: 2020-10-28 | Discharge: 2020-10-28 | Disposition: A | Discharge status: Home / Self Care | Payer: Medicare HMO | Attending: Urology | Admitting: Urology

## 2020-10-28 ENCOUNTER — Ambulatory Visit (HOSPITAL_COMMUNITY): Payer: Medicare HMO

## 2020-10-28 ENCOUNTER — Ambulatory Visit (HOSPITAL_COMMUNITY): Payer: Medicare HMO | Admitting: Physician Assistant

## 2020-10-28 ENCOUNTER — Ambulatory Visit (HOSPITAL_COMMUNITY): Payer: Medicare HMO | Admitting: Anesthesiology

## 2020-10-28 DIAGNOSIS — Z881 Allergy status to other antibiotic agents status: Secondary | ICD-10-CM | POA: Insufficient documentation

## 2020-10-28 DIAGNOSIS — Z87442 Personal history of urinary calculi: Secondary | ICD-10-CM | POA: Insufficient documentation

## 2020-10-28 DIAGNOSIS — Z79899 Other long term (current) drug therapy: Secondary | ICD-10-CM | POA: Diagnosis not present

## 2020-10-28 DIAGNOSIS — N200 Calculus of kidney: Secondary | ICD-10-CM | POA: Insufficient documentation

## 2020-10-28 DIAGNOSIS — Z87891 Personal history of nicotine dependence: Secondary | ICD-10-CM | POA: Diagnosis not present

## 2020-10-28 DIAGNOSIS — Z885 Allergy status to narcotic agent status: Secondary | ICD-10-CM | POA: Insufficient documentation

## 2020-10-28 DIAGNOSIS — E785 Hyperlipidemia, unspecified: Secondary | ICD-10-CM | POA: Diagnosis not present

## 2020-10-28 DIAGNOSIS — Z79891 Long term (current) use of opiate analgesic: Secondary | ICD-10-CM | POA: Diagnosis not present

## 2020-10-28 DIAGNOSIS — N3080 Other cystitis without hematuria: Secondary | ICD-10-CM | POA: Diagnosis not present

## 2020-10-28 DIAGNOSIS — Z96641 Presence of right artificial hip joint: Secondary | ICD-10-CM | POA: Diagnosis not present

## 2020-10-28 DIAGNOSIS — E559 Vitamin D deficiency, unspecified: Secondary | ICD-10-CM | POA: Diagnosis not present

## 2020-10-28 DIAGNOSIS — K449 Diaphragmatic hernia without obstruction or gangrene: Secondary | ICD-10-CM | POA: Diagnosis not present

## 2020-10-28 HISTORY — PX: CYSTOSCOPY WITH RETROGRADE PYELOGRAM, URETEROSCOPY AND STENT PLACEMENT: SHX5789

## 2020-10-28 HISTORY — PX: HOLMIUM LASER APPLICATION: SHX5852

## 2020-10-28 LAB — GLUCOSE, CAPILLARY: Glucose-Capillary: 91 mg/dL (ref 70–99)

## 2020-10-28 SURGERY — CYSTOURETEROSCOPY, WITH RETROGRADE PYELOGRAM AND STENT INSERTION
Anesthesia: General | Laterality: Right

## 2020-10-28 MED ORDER — ORAL CARE MOUTH RINSE
15.0000 mL | Freq: Once | OROMUCOSAL | Status: AC
Start: 2020-10-28 — End: 2020-10-28

## 2020-10-28 MED ORDER — MEPERIDINE HCL 50 MG/ML IJ SOLN: 6.2500 mg | INTRAMUSCULAR | Status: DC | PRN

## 2020-10-28 MED ORDER — MIDAZOLAM HCL 2 MG/2ML IJ SOLN
INTRAMUSCULAR | Status: AC
  Filled 2020-10-28: qty 2

## 2020-10-28 MED ORDER — FENTANYL CITRATE (PF) 100 MCG/2ML IJ SOLN
INTRAMUSCULAR | Status: AC
  Filled 2020-10-28: qty 2

## 2020-10-28 MED ORDER — PHENYLEPHRINE 40 MCG/ML (10ML) SYRINGE FOR IV PUSH (FOR BLOOD PRESSURE SUPPORT)
PREFILLED_SYRINGE | INTRAVENOUS | Status: AC
  Filled 2020-10-28: qty 10

## 2020-10-28 MED ORDER — 0.9 % SODIUM CHLORIDE (POUR BTL) OPTIME
TOPICAL | Status: DC | PRN
  Administered 2020-10-28: 1000 mL

## 2020-10-28 MED ORDER — TRAMADOL HCL 50 MG PO TABS: 50.0000 mg | ORAL_TABLET | Freq: Four times a day (QID) | ORAL | 0 refills | Status: AC | PRN

## 2020-10-28 MED ORDER — FENTANYL CITRATE PF 50 MCG/ML IJ SOSY: 25.0000 ug | PREFILLED_SYRINGE | INTRAMUSCULAR | Status: DC | PRN

## 2020-10-28 MED ORDER — PROPOFOL 10 MG/ML IV BOLUS
INTRAVENOUS | Status: AC
  Filled 2020-10-28: qty 20

## 2020-10-28 MED ORDER — IOHEXOL 300 MG/ML  SOLN
INTRAMUSCULAR | Status: DC | PRN
  Administered 2020-10-28: 10 mL

## 2020-10-28 MED ORDER — ONDANSETRON HCL 4 MG/2ML IJ SOLN
INTRAMUSCULAR | Status: DC | PRN
  Administered 2020-10-28: 4 mg via INTRAVENOUS

## 2020-10-28 MED ORDER — MIDAZOLAM HCL 5 MG/5ML IJ SOLN
INTRAMUSCULAR | Status: DC | PRN
  Administered 2020-10-28: 2 mg via INTRAVENOUS

## 2020-10-28 MED ORDER — DEXAMETHASONE SODIUM PHOSPHATE 10 MG/ML IJ SOLN
INTRAMUSCULAR | Status: DC | PRN
  Administered 2020-10-28: 5 mg via INTRAVENOUS

## 2020-10-28 MED ORDER — CHLORHEXIDINE GLUCONATE 0.12 % MT SOLN
15.0000 mL | Freq: Once | OROMUCOSAL | Status: AC
  Administered 2020-10-28: 15 mL via OROMUCOSAL

## 2020-10-28 MED ORDER — PHENYLEPHRINE 40 MCG/ML (10ML) SYRINGE FOR IV PUSH (FOR BLOOD PRESSURE SUPPORT)
PREFILLED_SYRINGE | INTRAVENOUS | Status: DC | PRN
  Administered 2020-10-28: 80 ug via INTRAVENOUS
  Administered 2020-10-28: 120 ug via INTRAVENOUS
  Administered 2020-10-28 (×2): 80 ug via INTRAVENOUS
  Administered 2020-10-28: 120 ug via INTRAVENOUS
  Administered 2020-10-28 (×3): 80 ug via INTRAVENOUS

## 2020-10-28 MED ORDER — SODIUM CHLORIDE 0.9 % IR SOLN
Status: DC | PRN
  Administered 2020-10-28: 3000 mL

## 2020-10-28 MED ORDER — FENTANYL CITRATE (PF) 100 MCG/2ML IJ SOLN
INTRAMUSCULAR | Status: DC | PRN
  Administered 2020-10-28: 100 ug via INTRAVENOUS

## 2020-10-28 MED ORDER — PROPOFOL 10 MG/ML IV BOLUS
INTRAVENOUS | Status: DC | PRN
  Administered 2020-10-28: 160 mg via INTRAVENOUS

## 2020-10-28 MED ORDER — DROPERIDOL 2.5 MG/ML IJ SOLN: 0.6250 mg | Freq: Once | INTRAMUSCULAR | Status: DC | PRN

## 2020-10-28 MED ORDER — LIDOCAINE 2% (20 MG/ML) 5 ML SYRINGE
INTRAMUSCULAR | Status: DC | PRN
  Administered 2020-10-28: 80 mg via INTRAVENOUS

## 2020-10-28 MED ORDER — LACTATED RINGERS IV SOLN: INTRAVENOUS | Status: DC

## 2020-10-28 MED ORDER — CIPROFLOXACIN IN D5W 400 MG/200ML IV SOLN
400.0000 mg | Freq: Once | INTRAVENOUS | Status: AC
  Administered 2020-10-28: 400 mg via INTRAVENOUS
  Filled 2020-10-28: qty 200

## 2020-10-28 SURGICAL SUPPLY — 23 items
BAG URO CATCHER STRL LF (MISCELLANEOUS) ×2 IMPLANT
BASKET ZERO TIP NITINOL 2.4FR (BASKET) ×4 IMPLANT
BSKT STON RTRVL ZERO TP 2.4FR (BASKET) ×2
CATH URET 5FR 28IN OPEN ENDED (CATHETERS) ×2 IMPLANT
CLOTH BEACON ORANGE TIMEOUT ST (SAFETY) ×2 IMPLANT
COVER DOME SNAP 22 D (MISCELLANEOUS) ×2 IMPLANT
DRSG TEGADERM 2-3/8X2-3/4 SM (GAUZE/BANDAGES/DRESSINGS) IMPLANT
EXTRACTOR STONE 1.7FRX115CM (UROLOGICAL SUPPLIES) IMPLANT
FIBER LASER MOSES 200 DFL (Laser) ×2 IMPLANT
GLOVE SURG ENC MOIS LTX SZ6.5 (GLOVE) ×2 IMPLANT
GOWN STRL REUS W/TWL LRG LVL3 (GOWN DISPOSABLE) ×2 IMPLANT
GUIDEWIRE STR DUAL SENSOR (WIRE) ×4 IMPLANT
KIT TURNOVER KIT A (KITS) IMPLANT
LASER FIB FLEXIVA PULSE ID 365 (Laser) ×2 IMPLANT
MANIFOLD NEPTUNE II (INSTRUMENTS) ×2 IMPLANT
PACK CYSTO (CUSTOM PROCEDURE TRAY) ×2 IMPLANT
SHEATH URETERAL 12FRX28CM (UROLOGICAL SUPPLIES) ×2 IMPLANT
SHEATH URETERAL 12FRX35CM (MISCELLANEOUS) IMPLANT
STENT URET 6FRX24 CONTOUR (STENTS) ×2 IMPLANT
TRACTIP FLEXIVA PULS ID 200XHI (Laser) ×1 IMPLANT
TRACTIP FLEXIVA PULSE ID 200 (Laser) ×2
TUBING CONNECTING 10 (TUBING) ×2 IMPLANT
TUBING UROLOGY SET (TUBING) ×2 IMPLANT

## 2020-10-28 NOTE — Anesthesia Procedure Notes (Signed)
Procedure Name: LMA Insertion Date/Time: 10/28/2020 11:19 AM Performed by: Gean Maidens, CRNA Pre-anesthesia Checklist: Patient identified, Emergency Drugs available, Suction available, Patient being monitored and Timeout performed Patient Re-evaluated:Patient Re-evaluated prior to induction Oxygen Delivery Method: Circle system utilized Preoxygenation: Pre-oxygenation with 100% oxygen Induction Type: IV induction Ventilation: Mask ventilation without difficulty LMA: LMA flexible inserted LMA Size: 4.0 Number of attempts: 1 Placement Confirmation: positive ETCO2 and breath sounds checked- equal and bilateral Tube secured with: Tape Dental Injury: Teeth and Oropharynx as per pre-operative assessment

## 2020-10-28 NOTE — H&P (Signed)
CC/HPI: cc: follow up CT   08/04/20: 65 year old woman who has been seen at [facility] last by Dr. Louis Meckel for urinary tract infections. She has been referred for abnormal urinalysis as well as several days of right back pain/spasm. Patient denies getting frequent urinary tract infections however states she does have bacteria and inflammation in her urine. She has had kidney stones in the past but none recently. She has been experiencing right back pain for the last few days and thinks it may be due to caring her 30 lb puppy up the stairs. She has had a renal abscess in the past. She denies gross hematuria. She has no symptoms of UTI.   08/25/20: 65 year old woman with a history of urolithiasis and asymptomatic bacteriuria here for follow-up. CT urogram showed bilateral stone burden greater than 1 cm. She is currently asymptomatic. Visualization of bladder was poor due to hip replacement. She denies any gross hematuria or UTIs in the interim. No flank pain.   Interval: 3 days ago she developed right sided dull flank pain that is intermittent. She does not have any other associated symptoms with this pain. No fevers, chills or gross hematuria.     ALLERGIES: Codeine Derivatives Keflex TABS    MEDICATIONS: Crestor  Diflucan  Oxybutynin Chloride 5 mg tablet  B12 Active  Diovan Hct  Magnesium Gluconate  Magonate  Montelukast Sodium  Probiotic  Promethazine-Dm 6.25 mg-15 mg/5 ml syrup  Tylenol Pm Extra Strength  Vitamin D2  Vitamin E     GU PSH: Locm 300-399Mg /Ml Iodine,1Ml - 08/11/2020       PSH Notes: Gallbladder Surgery, Shoulder Surgery, Total Hip Replacement   NON-GU PSH: Total Hip Replacement - 04/16/2014     GU PMH: Renal calculus - 08/25/2020, - 08/11/2020, - 08/04/2020 Acute Cystitis/UTI - 04/16/18 Renal and perinephric abscess, Renal abscess - April 16, 2014    NON-GU PMH: Bacteriuria - 08/25/2020, - 08/04/2020 Pyuria/other UA findings - 08/04/2020 Encounter for general adult medical  examination without abnormal findings, Encounter for preventive health examination - Apr 16, 2014 Personal history of other diseases of the circulatory system, History of hypertension - 2014/04/16 Personal history of other diseases of the musculoskeletal system and connective tissue, History of arthritis - 04/16/14 Personal history of other specified conditions, History of heartburn - 04-16-2014    FAMILY HISTORY: Death of family member - Runs In Family throat cancer - Runs In Family   SOCIAL HISTORY: Marital Status: Single Preferred Language: English; Ethnicity: Not Hispanic Or Latino; Race: White Current Smoking Status: Patient does not smoke anymore. Smoked for 6 months.   Tobacco Use Assessment Completed: Used Tobacco in last 30 days? Has never drank.  Drinks 1 caffeinated drink per day. Patient's occupation is/was Disabled.     Notes: Disabled, Caffeine use, Single, Never a smoker, Alcohol use   REVIEW OF SYSTEMS:    GU Review Female:   Patient denies frequent urination, hard to postpone urination, burning /pain with urination, get up at night to urinate, leakage of urine, stream starts and stops, trouble starting your stream, have to strain to urinate, and being pregnant.  Gastrointestinal (Upper):   Patient denies nausea, vomiting, and indigestion/ heartburn.  Gastrointestinal (Lower):   Patient denies diarrhea and constipation.  Constitutional:   Patient denies fever, night sweats, weight loss, and fatigue.  Musculoskeletal:   Patient denies back pain and joint pain.  Neurological:   Patient denies headaches and dizziness.  Psychologic:   Patient denies depression and anxiety.   Notes: right flank pain  VITAL SIGNS: None   MULTI-SYSTEM PHYSICAL EXAMINATION:    Constitutional: Well-nourished. No physical deformities. Normally developed. Good grooming.  Cardiovascular: Normal temperature, normal extremity pulses, no swelling, no varicosities.  Skin: No paleness, no jaundice, no cyanosis. No  lesion, no ulcer, no rash.  Neurologic / Psychiatric: Oriented to time, oriented to place, oriented to person. No depression, no anxiety, no agitation.     Complexity of Data:  Source Of History:  Patient  Records Review:   Previous Doctor Records, Previous Patient Records  Urine Test Review:   Urinalysis  X-Ray Review: C.T. Stone Protocol: Reviewed Films.     PROCEDURES:         Renal Ultrasound (Limited) - 37543  RT Kidney: Length: 12.3 cm Depth:5.3 cm Cortical Width: 1.6 cm Width: 5.1 cm    Right Kidney/Ureter:  ? Multiple stone, largest= 1.4cm UP  Bladder:  PVR= 39.76ML         Difficult study due to body habitus and increased bowel gas. Patient confirmed No Neulasta OnPro Device.            KUB - [id number]  A single view of the abdomen is obtained. bilateral renal shadows are visualized.   Calculi:  Upper pole left kidney calculi. Middle pole left kidney calculi. Lower pole left kidney calculi. Left largest calculi measures ___56mm___ Upper pole right kidney calculi. Middle pole right kidney calculi. Lower pole right kidney calculi. Right largest calculi measures: _53mm_____      Patient confirmed No Neulasta OnPro Device.     ASSESSMENT:      ICD-10 Details  1 GU:   Renal calculus - N20.0 Bilateral, Chronic, Exacerbation   PLAN:           Orders X-Rays: KUB    Renal Ultrasound (Limited)  X-Ray Notes: right please.            Schedule         Document Letter(s):  Created for Patient: Clinical Summary         Notes:   Renal ultrasound is not concerning for hydronephrosis today. There are bilateral calculi noted the largest being 15 mm within the right renal shadow as well as a 12 mm within the left lower pole of the left renal shadow. She would like to discuss further treatment of these stones. I advised that the potential treatment options include ESWL, PCNL and Ureteroscopy and would likely be staged procedures. I also advised that further discussion  would be needed with her urologist regarding definitive stone management and that I would follow up regarding her recommendations. Today, her renal ultrasound does not show obstruction.

## 2020-10-28 NOTE — Interval H&P Note (Signed)
History and Physical Interval Note:  10/28/2020 10:20 AM  Christine [name]  has presented today for surgery, with the diagnosis of BILATERAL RENAL STONES.  The various methods of treatment have been discussed with the patient and family. After consideration of risks, benefits and other options for treatment, the patient has consented to  Procedure(s) with comments: CYSTOSCOPY WITH RETROGRADE PYELOGRAM, URETEROSCOPY AND STENT PLACEMENT (Bilateral) - 1 HR HOLMIUM LASER APPLICATION (Bilateral) as a surgical intervention.  The patient's history has been reviewed, patient examined, no change in status, stable for surgery.  I have reviewed the patient's chart and labs.  Questions were answered to the patient's satisfaction.     [name] D Laryah Neuser

## 2020-10-28 NOTE — Anesthesia Preprocedure Evaluation (Addendum)
Anesthesia Evaluation  Patient identified by MRN, date of birth, ID band Patient awake    Reviewed: Allergy & Precautions, NPO status , Patient's Chart, lab work & pertinent test results  History of Anesthesia Complications (+) PONV and history of anesthetic complications  Airway Mallampati: III  TM Distance: >3 FB Neck ROM: Full    Dental  (+) Dental Advisory Given   Pulmonary asthma ,    Pulmonary exam normal breath sounds clear to auscultation       Cardiovascular hypertension, Pt. on medications Normal cardiovascular exam Rhythm:Regular Rate:Normal     Neuro/Psych  Headaches, PSYCHIATRIC DISORDERS Depression    GI/Hepatic Neg liver ROS, hiatal hernia, GERD  ,  Endo/Other  Morbid obesity  Renal/GU Renal disease     Musculoskeletal  (+) Arthritis ,   Abdominal (+) + obese,   Peds  Hematology negative hematology ROS (+) anemia ,   Anesthesia Other Findings   Reproductive/Obstetrics                            Anesthesia Physical Anesthesia Plan  ASA: 3  Anesthesia Plan: General   Post-op Pain Management:    Induction: Intravenous  PONV Risk Score and Plan: 4 or greater and Ondansetron, Dexamethasone, Treatment may vary due to age or medical condition, Midazolam and Diphenhydramine  Airway Management Planned: LMA  Additional Equipment:   Intra-op Plan:   Post-operative Plan: Extubation in OR  Informed Consent: I have reviewed the patients History and Physical, chart, labs and discussed the procedure including the risks, benefits and alternatives for the proposed anesthesia with the patient or authorized representative who has indicated his/her understanding and acceptance.     Dental advisory given  Plan Discussed with: CRNA  Anesthesia Plan Comments:        Anesthesia Quick Evaluation

## 2020-10-28 NOTE — Op Note (Signed)
Preoperative diagnosis: right renal calculus  Postoperative diagnosis: right renal calculus  Procedure:  Cystoscopy right ureteroscopy, laser lithotripsy, basket stone extraction right 56F x 24 ureteral stent placement  right retrograde pyelography with interpretation  Surgeon: Jacalyn Lefevre, MD  Anesthesia: General  Complications: None  Intraoperative findings:  Normal urethra Bilateral orthotropic ureteral orifices right retrograde pyelography demonstrated normal ureteral without filling defects. 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   patient with a 17 mm right renal stone and associated right symptoms. After reviewing the management options for treatment, the patient elected to proceed with the above surgical procedure(s). We have discussed the potential benefits and risks of the procedure, side effects of the proposed treatment, the likelihood of the patient achieving the goals of the procedure, and any potential problems that might occur during the procedure or recuperation. Informed consent has been obtained.   Description of procedure:  The patient was taken to the operating room and general anesthesia was induced.  The patient was placed in the dorsal lithotomy position, prepped and draped in the usual sterile fashion, and preoperative antibiotics were administered. A preoperative time-out was performed.   Cystourethroscopy was performed.  The patient's urethra was examined and was normal. The bladder was then systematically examined in its entirety.   Attention then turned to the right ureteral orifice and a ureteral catheter was used to intubate the ureteral orifice.  Omnipaque contrast was injected through the ureteral catheter and a retrograde pyelogram was performed with  findings as dictated above.  A 0.38 sensor guidewire was then advanced up the right ureter into the renal pelvis under fluoroscopic guidance.  A second wire was placed alongside the first wire in similar manner.  Next ureteral access sheath was placed over the second wire using fluoroscopic guidance.  The inner sheath and wire were removed.  Flexible ureteroscopy then took place.  A large stone was seen in the upper pole.  The stone was then fragmented with the 200 micron holmium laser fiber.  Basketing took place until visualization was poor.  There was some remaining stone fragments in the upper pole however it was difficult to see.  Decision was made to end the case.  The ureteroscope was removed and unison with the sheath taking care to examine the ureter on the way out no trauma or residual stone fragments were reviewed.   The wire was then backloaded through the cystoscope and a ureteral stent was advance over the wire using Seldinger technique.  The stent was positioned appropriately under fluoroscopic and cystoscopic guidance.  The wire was then removed with an adequate stent curl noted in the renal pelvis as well as in the bladder.  The bladder was then emptied and the procedure ended.  The patient appeared to tolerate the procedure well and without complications.  The patient was able to be awakened and transferred to the recovery unit in satisfactory condition.   Disposition: No tether left on stent.  Patient returned to the operating room for left-sided ureteroscopy in the future.  I will obtain KUB prior to stent removal in the office.

## 2020-10-28 NOTE — Discharge Instructions (Addendum)
DISCHARGE INSTRUCTIONS FOR KIDNEY STONE/URETERAL STENT   MEDICATIONS:  1. Resume all your other meds from home  2. AZO over the counter can help with the burning/stinging when you urinate. 3. Tramadol is for moderate/severe pain, otherwise taking up to 1000 mg every 6 hours of plainTylenol will help treat your pain.      ACTIVITY:  1. No strenuous activity x 1week  2. No driving while on narcotic pain medications  3. Drink plenty of water  4. Continue to walk at home - you can still get blood clots when you are at home, so keep active, but don't over do it.  5. May return to work/school tomorrow or when you feel ready   BATHING:  1. You can shower and we recommend daily showers    SIGNS/SYMPTOMS TO CALL:  Please call us if you have a fever greater than 101.5, uncontrolled nausea/vomiting, uncontrolled pain, dizziness, unable to urinate, bloody urine, chest pain, shortness of breath, leg swelling, leg pain, redness around wound, drainage from wound, or any other concerns or questions.   You can reach [place] at [phone].   FOLLOW-UP:  1. You will be called to discuss right stent removal as well as schedule left kidney stone removal surgery.

## 2020-10-28 NOTE — Transfer of Care (Signed)
Immediate Anesthesia Transfer of Care Note  Patient: Christine [name]  Procedure(s) Performed: CYSTOSCOPY WITH RETROGRADE PYELOGRAM, URETEROSCOPY AND STENT PLACEMENT (Right) HOLMIUM LASER APPLICATION (Right)  Patient Location: PACU  Anesthesia Type:General  Level of Consciousness: sedated, patient cooperative and responds to stimulation  Airway & Oxygen Therapy: Patient Spontanous Breathing and Patient connected to face mask oxygen  Post-op Assessment: Report given to RN and Post -op Vital signs reviewed and stable  Post vital signs: Reviewed and stable  Last Vitals:  Vitals Value Taken Time  BP 102/66 10/28/20 1235  Temp    Pulse 80 10/28/20 1237  Resp 17 10/28/20 1237  SpO2 95 % 10/28/20 1237  Vitals shown include unvalidated device data.  Last Pain:  Vitals:   10/28/20 0848  TempSrc:   PainSc: 0-No pain         Complications: No notable events documented.

## 2020-10-29 ENCOUNTER — Encounter (HOSPITAL_COMMUNITY): Payer: Self-pay | Admitting: Urology

## 2020-10-29 NOTE — Anesthesia Postprocedure Evaluation (Signed)
Anesthesia Post Note  Patient: Christine [name]  Procedure(s) Performed: CYSTOSCOPY WITH RETROGRADE PYELOGRAM, URETEROSCOPY AND STENT PLACEMENT (Right) HOLMIUM LASER APPLICATION (Right)     Patient location during evaluation: PACU Anesthesia Type: General Level of consciousness: sedated and patient cooperative Pain management: pain level controlled Vital Signs Assessment: post-procedure vital signs reviewed and stable Respiratory status: spontaneous breathing Cardiovascular status: stable Anesthetic complications: no   No notable events documented.  Last Vitals:  Vitals:   10/28/20 1315 10/28/20 1330  BP: 136/84 (!) 154/79  Pulse: 80 85  Resp: 16 15  Temp: (!) 36.4 C (!) 36.4 C  SpO2: 95% 96%    Last Pain:  Vitals:   10/28/20 1348  TempSrc:   PainSc: 0-No pain                 Nolon Nations

## 2020-10-30 DIAGNOSIS — N200 Calculus of kidney: Secondary | ICD-10-CM | POA: Diagnosis not present

## 2020-11-03 DIAGNOSIS — R8271 Bacteriuria: Secondary | ICD-10-CM | POA: Diagnosis not present

## 2020-11-03 DIAGNOSIS — N200 Calculus of kidney: Secondary | ICD-10-CM | POA: Diagnosis not present

## 2020-11-05 ENCOUNTER — Other Ambulatory Visit: Payer: Self-pay | Admitting: Urology

## 2020-11-06 ENCOUNTER — Other Ambulatory Visit: Payer: Self-pay

## 2020-11-06 ENCOUNTER — Encounter (HOSPITAL_COMMUNITY): Payer: Self-pay | Admitting: Urology

## 2020-11-06 ENCOUNTER — Other Ambulatory Visit: Payer: Self-pay | Admitting: Urology

## 2020-11-06 NOTE — Progress Notes (Signed)
COVID swab appointment:N/A   COVID Vaccine Completed: yes x3  Date of COVID positive in last 90 days: N/A   PCP - Christine Pinto, MD Cardiologist - N/A   Chest x-ray - 07/09/20 Epic EKG - 07/09/20 Epic Stress Test - N/A ECHO - N/A Cardiac Cath - N/A Pacemaker/ICD device last checked: N/A Spinal Cord Stimulator: N/A   Sleep Study - N/A CPAP - N/A   Fasting Blood Sugar - N/A Checks Blood Sugar __N/A___ times a day   Blood Thinner Instructions: N/A Aspirin Instructions:N/A Last Dose:N/A   Activity level: Can go up a flight of stairs and perform activities of daily living without stopping and without symptoms of chest pain or shortness of breath.                                                     Anesthesia review: HTN pre DM   Patient denies shortness of breath, fever, cough and chest pain at PAT appointment     Patient verbalized understanding of instructions that were given to them at the PAT appointment. Patient was also instructed that they will need to review over the PAT instructions again at home before surgery.

## 2020-11-17 NOTE — H&P (Signed)
CC/HPI: cc: follow up CT   08/04/20: 65 year old woman who has been seen at [facility] last by Dr. Louis Meckel for urinary tract infections. She has been referred for abnormal urinalysis as well as several days of right back pain/spasm. Patient denies getting frequent urinary tract infections however states she does have bacteria and inflammation in her urine. She has had kidney stones in the past but none recently. She has been experiencing right back pain for the last few days and thinks it may be due to caring her 30 lb puppy up the stairs. She has had a renal abscess in the past. She denies gross hematuria. She has no symptoms of UTI.   08/25/20: 65 year old woman with a history of urolithiasis and asymptomatic bacteriuria here for follow-up. CT urogram showed bilateral stone burden greater than 1 cm. She is currently asymptomatic. Visualization of bladder was poor due to hip replacement. She denies any gross hematuria or UTIs in the interim. No flank pain.   11/03/20: 65 year old woman with bilateral urolithiasis recently underwent right ureteroscopy with laser lithotripsy and stent placement. She returns today with KUB prior to stent removal. Her stent is on a tether. Repeat KUB shows significant decrease in stone burden on the right side. There is a small fragment approximately 4 to 5 mm on the right. Patient been tolerating the stent well. She has had a slight discomfort on the left side over the weekend.     ALLERGIES: Codeine Derivatives Keflex TABS    MEDICATIONS: Crestor  Diflucan  Oxybutynin Chloride 5 mg tablet  B12 Active  Diovan Hct  Magnesium Gluconate  Magonate  Meloxicam 15 mg tablet  Montelukast Sodium  Probiotic  Promethazine-Dm 6.25 mg-15 mg/5 ml syrup  Tramadol Hcl 50 mg tablet  Tylenol Pm Extra Strength  Vitamin D2  Vitamin E     GU PSH: Locm 300-399Mg /Ml Iodine,1Ml - 08/11/2020 Ureteroscopic laser litho - 10/28/2020       PSH Notes: Gallbladder Surgery,  Shoulder Surgery, Total Hip Replacement   NON-GU PSH: Total Hip Replacement - 2014-04-06     GU PMH: Renal calculus - 10/06/2020, - 08/25/2020, - 08/11/2020, - 08/04/2020 Acute Cystitis/UTI - 06-Apr-2018 Renal and perinephric abscess, Renal abscess - 04/06/14    NON-GU PMH: Bacteriuria - 08/25/2020, - 08/04/2020 Pyuria/other UA findings - 08/04/2020 Encounter for general adult medical examination without abnormal findings, Encounter for preventive health examination - April 06, 2014 Personal history of other diseases of the circulatory system, History of hypertension - 2014-04-06 Personal history of other diseases of the musculoskeletal system and connective tissue, History of arthritis - Apr 06, 2014 Personal history of other specified conditions, History of heartburn - 06-Apr-2014    FAMILY HISTORY: Death of family member - Runs In Family throat cancer - Runs In Family   SOCIAL HISTORY: Marital Status: Single Preferred Language: English; Ethnicity: Not Hispanic Or Latino; Race: White Current Smoking Status: Patient does not smoke anymore. Smoked for 6 months.   Tobacco Use Assessment Completed: Used Tobacco in last 30 days? Has never drank.  Drinks 1 caffeinated drink per day. Patient's occupation is/was Disabled.     Notes: Disabled, Caffeine use, Single, Never a smoker, Alcohol use   REVIEW OF SYSTEMS:    GU Review Female:   Patient denies have to strain to urinate, frequent urination, leakage of urine, hard to postpone urination, burning /pain with urination, trouble starting your stream, being pregnant, get up at night to urinate, and stream starts and stops.  Gastrointestinal (Upper):   Patient denies nausea,  vomiting, and indigestion/ heartburn.  Gastrointestinal (Lower):   Patient denies diarrhea and constipation.  Constitutional:   Patient denies fever, night sweats, weight loss, and fatigue.  Skin:   Patient denies skin rash/ lesion and itching.  Eyes:   Patient denies blurred vision and double vision.  Ears/ Nose/ Throat:    Patient denies sore throat and sinus problems.  Hematologic/Lymphatic:   Patient denies swollen glands and easy bruising.  Cardiovascular:   Patient denies leg swelling and chest pains.  Respiratory:   Patient denies cough and shortness of breath.  Endocrine:   Patient denies excessive thirst.  Musculoskeletal:   Patient reports back pain and joint pain.   Neurological:   Patient denies headaches and dizziness.  Psychologic:   Patient denies depression and anxiety.   Notes: rt side spasm    VITAL SIGNS:      11/03/2020 09:37 AM  BP 120/73 mmHg  Heart Rate 89 /min  Temperature 98.0 F / 36.6 C   MULTI-SYSTEM PHYSICAL EXAMINATION:    Constitutional: Well-nourished. No physical deformities. Normally developed. Good grooming.  Neck: Neck symmetrical, not swollen. Normal tracheal position.  Respiratory: No labored breathing, no use of accessory muscles.   Skin: No paleness, no jaundice, no cyanosis. No lesion, no ulcer, no rash.  Neurologic / Psychiatric: Oriented to time, oriented to place, oriented to person. No depression, no anxiety, no agitation.  Gastrointestinal: No rigidity  Eyes: Normal conjunctivae. Normal eyelids.  Ears, Nose, Mouth, and Throat: Left ear no scars, no lesions, no masses. Right ear no scars, no lesions, no masses. Nose no scars, no lesions, no masses. Normal hearing. Normal lips.  Musculoskeletal: Normal gait and station of head and neck.     Complexity of Data:  Records Review:   Previous Patient Records, POC Tool  Urine Test Review:   Urinalysis   PROCEDURES:         KUB - [id number]  A single view of the abdomen is obtained.      . Patient confirmed No Neulasta OnPro Device.           Urinalysis w/Scope - 81001 Dipstick Dipstick Cont'd Micro  Color: Amber Bilirubin: Neg mg/dL WBC/hpf: >60/hpf  Appearance: Turbid Ketones: Neg mg/dL RBC/hpf: 10 - 20/hpf  Specific Gravity: 1.015 Blood: 3+ ery/uL Bacteria: Many (>50/hpf)  pH: 6.0 Protein: 1+ mg/dL  Cystals: NS (Not Seen)  Glucose: Neg mg/dL Urobilinogen: 0.2 mg/dL Casts: NS (Not Seen)    Nitrites: Neg Trichomonas: Not Present    Leukocyte Esterase: 3+ leu/uL Mucous: Not Present      Epithelial Cells: NS (Not Seen)      Yeast: NS (Not Seen)      Sperm: Not Present    ASSESSMENT:      ICD-10 Details  1 GU:   Renal calculus - N20.0 Chronic, Stable  2 NON-GU:   Bacteriuria - R82.71 Chronic, Stable   PLAN:            Medications New Meds: Cipro 500 mg tablet 1 tablet PO BID start 5 days prior to next surgery for kidney stones  #10  0 Refill(s)            Document Letter(s):  Created for Patient: Clinical Summary         Notes:   1. Renal calculi: KUB shows good position of right ureteral stent with significant decrease in stone burden on the right side. Patient is tolerating the stent well. She would just like to have it  removed during her next surgery. We discussed risk, benefits and alternatives of left ureteroscopy with laser lithotripsy and stent placement. Patient will be scheduled for surgery in the next few weeks for the left side. She will be treated with antibiotics 5 days prior to procedure due to indwelling stent.

## 2020-11-18 ENCOUNTER — Ambulatory Visit (HOSPITAL_COMMUNITY): Payer: Medicare HMO | Admitting: Anesthesiology

## 2020-11-18 ENCOUNTER — Encounter (HOSPITAL_COMMUNITY)
Admission: RE | Disposition: A | Discharge status: Home / Self Care | Payer: Self-pay | Source: Home / Self Care | Attending: Urology

## 2020-11-18 ENCOUNTER — Ambulatory Visit (HOSPITAL_COMMUNITY): Payer: Medicare HMO

## 2020-11-18 ENCOUNTER — Encounter (HOSPITAL_COMMUNITY): Payer: Self-pay | Admitting: Urology

## 2020-11-18 ENCOUNTER — Ambulatory Visit (HOSPITAL_COMMUNITY)
Admission: RE | Admit: 2020-11-18 | Discharge: 2020-11-18 | Disposition: A | Discharge status: Home / Self Care | Payer: Medicare HMO | Attending: Urology | Admitting: Urology

## 2020-11-18 DIAGNOSIS — R8271 Bacteriuria: Secondary | ICD-10-CM | POA: Diagnosis not present

## 2020-11-18 DIAGNOSIS — Z79899 Other long term (current) drug therapy: Secondary | ICD-10-CM | POA: Diagnosis not present

## 2020-11-18 DIAGNOSIS — Z87440 Personal history of urinary (tract) infections: Secondary | ICD-10-CM | POA: Diagnosis not present

## 2020-11-18 DIAGNOSIS — K219 Gastro-esophageal reflux disease without esophagitis: Secondary | ICD-10-CM | POA: Diagnosis not present

## 2020-11-18 DIAGNOSIS — N200 Calculus of kidney: Secondary | ICD-10-CM | POA: Insufficient documentation

## 2020-11-18 DIAGNOSIS — Z6841 Body Mass Index (BMI) 40.0 and over, adult: Secondary | ICD-10-CM | POA: Diagnosis not present

## 2020-11-18 DIAGNOSIS — I10 Essential (primary) hypertension: Secondary | ICD-10-CM | POA: Insufficient documentation

## 2020-11-18 DIAGNOSIS — Z87442 Personal history of urinary calculi: Secondary | ICD-10-CM | POA: Diagnosis not present

## 2020-11-18 DIAGNOSIS — E559 Vitamin D deficiency, unspecified: Secondary | ICD-10-CM | POA: Diagnosis not present

## 2020-11-18 DIAGNOSIS — E538 Deficiency of other specified B group vitamins: Secondary | ICD-10-CM

## 2020-11-18 DIAGNOSIS — E785 Hyperlipidemia, unspecified: Secondary | ICD-10-CM | POA: Diagnosis not present

## 2020-11-18 HISTORY — PX: CYSTOSCOPY/URETEROSCOPY/HOLMIUM LASER/STENT PLACEMENT: SHX6546

## 2020-11-18 LAB — BASIC METABOLIC PANEL
Anion gap: 9 (ref 5–15)
BUN: 25 mg/dL — ABNORMAL HIGH (ref 8–23)
CO2: 23 mmol/L (ref 22–32)
Calcium: 9.5 mg/dL (ref 8.9–10.3)
Chloride: 105 mmol/L (ref 98–111)
Creatinine, Ser: 1.08 mg/dL — ABNORMAL HIGH (ref 0.44–1.00)
GFR, Estimated: 58 mL/min — ABNORMAL LOW (ref >60)
Glucose, Bld: 103 mg/dL — ABNORMAL HIGH (ref 70–99)
Potassium: 3.9 mmol/L (ref 3.5–5.1)
Sodium: 137 mmol/L (ref 135–145)

## 2020-11-18 LAB — CBC
HCT: 40.4 % (ref 36.0–46.0)
Hemoglobin: 13.3 g/dL (ref 12.0–15.0)
MCH: 30.2 pg (ref 26.0–34.0)
MCHC: 32.9 g/dL (ref 30.0–36.0)
MCV: 91.8 fL (ref 80.0–100.0)
Platelets: 323 10*3/uL (ref 150–400)
RBC: 4.4 MIL/uL (ref 3.87–5.11)
RDW: 13.4 % (ref 11.5–15.5)
WBC: 6.7 10*3/uL (ref 4.0–10.5)
nRBC: 0 % (ref 0.0–0.2)

## 2020-11-18 SURGERY — CYSTOSCOPY/URETEROSCOPY/HOLMIUM LASER/STENT PLACEMENT
Anesthesia: General | Site: Ureter | Laterality: Bilateral

## 2020-11-18 MED ORDER — FENTANYL CITRATE (PF) 100 MCG/2ML IJ SOLN
INTRAMUSCULAR | Status: AC
  Filled 2020-11-18: qty 2

## 2020-11-18 MED ORDER — FENTANYL CITRATE (PF) 100 MCG/2ML IJ SOLN
INTRAMUSCULAR | Status: DC | PRN
  Administered 2020-11-18 (×3): 25 ug via INTRAVENOUS

## 2020-11-18 MED ORDER — KETOROLAC TROMETHAMINE 30 MG/ML IJ SOLN
INTRAMUSCULAR | Status: AC
  Filled 2020-11-18: qty 1

## 2020-11-18 MED ORDER — DEXAMETHASONE SODIUM PHOSPHATE 10 MG/ML IJ SOLN
INTRAMUSCULAR | Status: AC
  Filled 2020-11-18: qty 1

## 2020-11-18 MED ORDER — FENTANYL CITRATE PF 50 MCG/ML IJ SOSY: 25.0000 ug | PREFILLED_SYRINGE | INTRAMUSCULAR | Status: DC | PRN

## 2020-11-18 MED ORDER — LIDOCAINE HCL (PF) 2 % IJ SOLN
INTRAMUSCULAR | Status: AC
  Filled 2020-11-18: qty 5

## 2020-11-18 MED ORDER — CIPROFLOXACIN HCL 500 MG PO TABS: 500.0000 mg | ORAL_TABLET | Freq: Once | ORAL | 0 refills | Status: AC

## 2020-11-18 MED ORDER — DEXAMETHASONE SODIUM PHOSPHATE 10 MG/ML IJ SOLN
INTRAMUSCULAR | Status: DC | PRN
  Administered 2020-11-18: 10 mg via INTRAVENOUS

## 2020-11-18 MED ORDER — CHLORHEXIDINE GLUCONATE 0.12 % MT SOLN
15.0000 mL | Freq: Once | OROMUCOSAL | Status: AC
  Administered 2020-11-18: 15 mL via OROMUCOSAL

## 2020-11-18 MED ORDER — PHENYLEPHRINE 40 MCG/ML (10ML) SYRINGE FOR IV PUSH (FOR BLOOD PRESSURE SUPPORT)
PREFILLED_SYRINGE | INTRAVENOUS | Status: DC | PRN
  Administered 2020-11-18: 160 ug via INTRAVENOUS

## 2020-11-18 MED ORDER — ONDANSETRON HCL 4 MG/2ML IJ SOLN
INTRAMUSCULAR | Status: AC
  Filled 2020-11-18: qty 2

## 2020-11-18 MED ORDER — LACTATED RINGERS IV SOLN: INTRAVENOUS | Status: DC

## 2020-11-18 MED ORDER — ACETAMINOPHEN 500 MG PO TABS
1000.0000 mg | ORAL_TABLET | Freq: Once | ORAL | Status: AC
  Administered 2020-11-18: 1000 mg via ORAL
  Filled 2020-11-18: qty 2

## 2020-11-18 MED ORDER — MIDAZOLAM HCL 5 MG/5ML IJ SOLN
INTRAMUSCULAR | Status: DC | PRN
  Administered 2020-11-18: 2 mg via INTRAVENOUS

## 2020-11-18 MED ORDER — LIDOCAINE 2% (20 MG/ML) 5 ML SYRINGE
INTRAMUSCULAR | Status: DC | PRN
Start: 2020-11-18 — End: 2020-11-18
  Administered 2020-11-18: 80 mg via INTRAVENOUS

## 2020-11-18 MED ORDER — PROPOFOL 10 MG/ML IV BOLUS
INTRAVENOUS | Status: DC | PRN
  Administered 2020-11-18: 150 mg via INTRAVENOUS
  Administered 2020-11-18: 50 mg via INTRAVENOUS

## 2020-11-18 MED ORDER — SODIUM CHLORIDE 0.9 % IR SOLN
Status: DC | PRN
  Administered 2020-11-18: 6000 mL

## 2020-11-18 MED ORDER — MIDAZOLAM HCL 2 MG/2ML IJ SOLN
INTRAMUSCULAR | Status: AC
  Filled 2020-11-18: qty 2

## 2020-11-18 MED ORDER — ONDANSETRON HCL 4 MG/2ML IJ SOLN
INTRAMUSCULAR | Status: DC | PRN
  Administered 2020-11-18: 4 mg via INTRAVENOUS

## 2020-11-18 MED ORDER — KETOROLAC TROMETHAMINE 30 MG/ML IJ SOLN
INTRAMUSCULAR | Status: DC | PRN
  Administered 2020-11-18: 30 mg via INTRAVENOUS

## 2020-11-18 MED ORDER — ORAL CARE MOUTH RINSE: 15.0000 mL | Freq: Once | OROMUCOSAL | Status: AC

## 2020-11-18 MED ORDER — CIPROFLOXACIN IN D5W 400 MG/200ML IV SOLN
400.0000 mg | Freq: Two times a day (BID) | INTRAVENOUS | Status: DC
  Administered 2020-11-18: 400 mg via INTRAVENOUS
  Filled 2020-11-18: qty 200

## 2020-11-18 MED ORDER — IOHEXOL 300 MG/ML  SOLN
INTRAMUSCULAR | Status: DC | PRN
  Administered 2020-11-18: 10 mL

## 2020-11-18 MED ORDER — PROPOFOL 10 MG/ML IV BOLUS
INTRAVENOUS | Status: AC
  Filled 2020-11-18: qty 20

## 2020-11-18 SURGICAL SUPPLY — 19 items
BAG URO CATCHER STRL LF (MISCELLANEOUS) ×3 IMPLANT
BASKET ZERO TIP NITINOL 2.4FR (BASKET) ×3 IMPLANT
BSKT STON RTRVL ZERO TP 2.4FR (BASKET) ×1
CATH URETL OPEN 5X70 (CATHETERS) ×3 IMPLANT
CLOTH BEACON ORANGE TIMEOUT ST (SAFETY) ×3 IMPLANT
DRSG TEGADERM 2-3/8X2-3/4 SM (GAUZE/BANDAGES/DRESSINGS) IMPLANT
EXTRACTOR STONE 1.7FRX115CM (UROLOGICAL SUPPLIES) IMPLANT
GLOVE SURG ENC MOIS LTX SZ6.5 (GLOVE) ×3 IMPLANT
GOWN STRL REUS W/TWL LRG LVL3 (GOWN DISPOSABLE) ×3 IMPLANT
GUIDEWIRE STR DUAL SENSOR (WIRE) ×6 IMPLANT
KIT TURNOVER KIT A (KITS) IMPLANT
MANIFOLD NEPTUNE II (INSTRUMENTS) ×3 IMPLANT
PACK CYSTO (CUSTOM PROCEDURE TRAY) ×3 IMPLANT
SHEATH NAVIGATOR HD 11/13X28 (SHEATH) ×3 IMPLANT
SHEATH NAVIGATOR HD 11/13X36 (SHEATH) IMPLANT
STENT CONTOUR 8FR X 24 (STENTS) ×3 IMPLANT
TUBING CONNECTING 10 (TUBING) ×2 IMPLANT
TUBING CONNECTING 10' (TUBING) ×1
TUBING UROLOGY SET (TUBING) ×3 IMPLANT

## 2020-11-18 NOTE — Interval H&P Note (Signed)
History and Physical Interval Note: Will also remove right ureteral stent at same time.   11/18/2020 9:32 AM  Christine [name]  has presented today for surgery, with the diagnosis of RENAL CALCULUS.  The various methods of treatment have been discussed with the patient and family. After consideration of risks, benefits and other options for treatment, the patient has consented to  Procedure(s): CYSTOSCOPY/URETEROSCOPY/HOLMIUM LASER/STENT PLACEMENT (Left) as a surgical intervention.  The patient's history has been reviewed, patient examined, no change in status, stable for surgery.  I have reviewed the patient's chart and labs.  Questions were answered to the patient's satisfaction.     Orla Jolliff D Starr Urias

## 2020-11-18 NOTE — Discharge Instructions (Addendum)
DISCHARGE INSTRUCTIONS FOR KIDNEY STONE/URETERAL STENT   MEDICATIONS:  1. Resume all your other meds from home  2. AZO over the counter can help with the burning/stinging when you urinate. 3. Tramadol is for moderate/severe pain, otherwise taking up to 1000 mg every 6 hours of plainTylenol will help treat your pain.   4. Take Cipro one hour prior to removal of your stent.    ACTIVITY:  1. No strenuous activity x 1week  2. No driving while on narcotic pain medications  3. Drink plenty of water  4. Continue to walk at home - you can still get blood clots when you are at home, so keep active, but don't over do it.  5. May return to work/school tomorrow or when you feel ready   BATHING:  1. You can shower and we recommend daily showers  2. You have a string coming from your urethra: The stent string is attached to your ureteral stent. Do not pull on this.   SIGNS/SYMPTOMS TO CALL:  Please call us if you have a fever greater than 101.5, uncontrolled nausea/vomiting, uncontrolled pain, dizziness, unable to urinate, bloody urine, chest pain, shortness of breath, leg swelling, leg pain, redness around wound, drainage from wound, or any other concerns or questions.   You can reach [place] at [phone].   FOLLOW-UP:  1. You have a string attached to your stent, you may remove it on Friday, November 18. To do this, pull the strings until the stent is completely removed. You may feel an odd sensation in your back.  Please take your antibiotic 1 hour prior to stent removal.

## 2020-11-18 NOTE — Anesthesia Procedure Notes (Addendum)
Procedure Name: LMA Insertion Date/Time: 11/18/2020 11:10 AM Performed by: Sharlette Dense, CRNA Patient Re-evaluated:Patient Re-evaluated prior to induction Oxygen Delivery Method: Circle system utilized Preoxygenation: Pre-oxygenation with 100% oxygen Induction Type: IV induction LMA: LMA inserted LMA Size: 4.0 Number of attempts: 1 Placement Confirmation: positive ETCO2 and breath sounds checked- equal and bilateral Tube secured with: Tape Dental Injury: Teeth and Oropharynx as per pre-operative assessment

## 2020-11-18 NOTE — Anesthesia Preprocedure Evaluation (Addendum)
Anesthesia Evaluation  Patient identified by MRN, date of birth, ID band Patient awake    Reviewed: Allergy & Precautions, NPO status , Patient's Chart, lab work & pertinent test results  History of Anesthesia Complications (+) PONV  Airway Mallampati: II  TM Distance: >3 FB Neck ROM: Full    Dental no notable dental hx. (+) Teeth Intact, Dental Advisory Given   Pulmonary asthma ,    Pulmonary exam normal breath sounds clear to auscultation       Cardiovascular hypertension, Pt. on medications Normal cardiovascular exam Rhythm:Regular Rate:Normal     Neuro/Psych  Headaches, PSYCHIATRIC DISORDERS Depression    GI/Hepatic Neg liver ROS, hiatal hernia, GERD  Controlled,  Endo/Other  Morbid obesity (BMI 44)  Renal/GU Renal InsufficiencyRenal disease  negative genitourinary   Musculoskeletal negative musculoskeletal ROS (+)   Abdominal   Peds  Hematology negative hematology ROS (+)   Anesthesia Other Findings   Reproductive/Obstetrics                            Anesthesia Physical Anesthesia Plan  ASA: 3  Anesthesia Plan: General   Post-op Pain Management:    Induction: Intravenous  PONV Risk Score and Plan: 4 or greater and Ondansetron, Dexamethasone and Midazolam  Airway Management Planned: LMA  Additional Equipment:   Intra-op Plan:   Post-operative Plan: Extubation in OR  Informed Consent: I have reviewed the patients History and Physical, chart, labs and discussed the procedure including the risks, benefits and alternatives for the proposed anesthesia with the patient or authorized representative who has indicated his/her understanding and acceptance.     Dental advisory given  Plan Discussed with: CRNA  Anesthesia Plan Comments:         Anesthesia Quick Evaluation

## 2020-11-18 NOTE — Op Note (Signed)
Preoperative diagnosis: left renal calculus  Postoperative diagnosis: left renal calculus  Procedure:  Cystoscopy left ureteroscopy, laser lithotripsy, basket stone extraction left 52F x 24 ureteral stent placement - with tether  left retrograde pyelography with interpretation Removal of right ureteral stent  Surgeon: Jacalyn Lefevre, MD  Anesthesia: General  Complications: None  Intraoperative findings:  Normal urethra Bilateral orthotropic ureteral orifices left retrograde pyelography demonstrated a filling defect within the left upper pole consistent with the patient's known calculus without other abnormalities. Bladder mucosa normal without masses   EBL: Minimal  Specimens: left renal calculus  Disposition of specimens: [facility] for stone analysis  Indication: Christine [name] is a 65 y.o.   patient with bilateral renal calculi who previously underwent right ureteroscopy with laser lithotripsy and stent placement now returns for the left side.  After reviewing the management options for treatment, the patient elected to proceed with the above surgical procedure(s). We have discussed the potential benefits and risks of the procedure, side effects of the proposed treatment, the likelihood of the patient achieving the goals of the procedure, and any potential problems that might occur during the procedure or recuperation. Informed consent has been obtained.   Description of procedure:  The patient was taken to the operating room and general anesthesia was induced.  The patient was placed in the dorsal lithotomy position, prepped and draped in the usual sterile fashion, and preoperative antibiotics were administered. A preoperative time-out was performed.   Cystourethroscopy was performed.  The patient's urethra was examined and was normal. The bladder was then systematically examined in its entirety. There was no evidence for any bladder tumors, stones,  or other mucosal pathology.    The right ureteral stent was noted coming from the right ureteral orifice.  Graspers were then used to grab the stent and remove it completely.  Attention then turned to the left ureteral orifice and a ureteral catheter was used to intubate the ureteral orifice.  Omnipaque contrast was injected through the ureteral catheter and a retrograde pyelogram was performed with findings as dictated above.  A 0.38 sensor guidewire was then advanced up the left ureter into the renal pelvis under fluoroscopic guidance.  A second wire was placed in a similar manner.  The cystoscope was removed and the ureteral access sheath was then placed over the second wire and advanced to the proximal ureter with fluoroscopic guidance.  The inner sheath and wire were removed.  Flexible ureteroscopy then took place.  A large stone was encountered in the upper pole.   The stone was then broken with the 200 micron holmium laser fiber.  All larger stones were then removed from the ureter with a 0 tip basket.  Reinspection of the ureter revealed no remaining visible stones or fragments.   The wire was then backloaded through the cystoscope and a ureteral stent was advance over the wire using Seldinger technique.  The stent was positioned appropriately under fluoroscopic and cystoscopic guidance.  The wire was then removed with an adequate stent curl noted in the renal pelvis as well as in the bladder.  The bladder was then emptied and the procedure ended.  The patient appeared to tolerate the procedure well and without complications.  The patient was able to be awakened and transferred to the recovery unit in satisfactory condition.   Disposition: The tether of the stent was left on and tucked inside the patient's vagina.  Instructions for removing the stent have been provided to the patient.

## 2020-11-18 NOTE — Transfer of Care (Signed)
Immediate Anesthesia Transfer of Care Note  Patient: Christine [name]  Procedure(s) Performed: CYSTOSCOPY/URETEROSCOPY/HOLMIUM LASER/STENT PLACEMENT LEFT, REMOVAL RIGHT STENT (Bilateral: Ureter)  Patient Location: PACU  Anesthesia Type:General  Level of Consciousness: awake  Airway & Oxygen Therapy: Patient Spontanous Breathing and Patient connected to face mask oxygen  Post-op Assessment: Report given to RN and Post -op Vital signs reviewed and stable  Post vital signs: Reviewed and stable  Last Vitals:  Vitals Value Taken Time  BP    Temp    Pulse 87 11/18/20 1225  Resp 16 11/18/20 1225  SpO2 100 % 11/18/20 1225  Vitals shown include unvalidated device data.  Last Pain:  Vitals:   11/18/20 0857  TempSrc:   PainSc: 0-No pain         Complications: No notable events documented.

## 2020-11-19 ENCOUNTER — Encounter (HOSPITAL_COMMUNITY): Payer: Self-pay | Admitting: Urology

## 2020-11-19 NOTE — Anesthesia Postprocedure Evaluation (Signed)
Anesthesia Post Note  Patient: Christine [name]  Procedure(s) Performed: CYSTOSCOPY/URETEROSCOPY/HOLMIUM LASER/STENT PLACEMENT LEFT, REMOVAL RIGHT STENT (Bilateral: Ureter)     Patient location during evaluation: PACU Anesthesia Type: General Level of consciousness: awake and alert Pain management: pain level controlled Vital Signs Assessment: post-procedure vital signs reviewed and stable Respiratory status: spontaneous breathing, nonlabored ventilation, respiratory function stable and patient connected to nasal cannula oxygen Cardiovascular status: blood pressure returned to baseline and stable Postop Assessment: no apparent nausea or vomiting Anesthetic complications: no   No notable events documented.  Last Vitals:  Vitals:   11/18/20 1245 11/18/20 1300  BP: 104/63 112/66  Pulse: 81 86  Resp: 16 12  Temp:  36.5 C  SpO2: 90% 97%    Last Pain:  Vitals:   11/18/20 1300  TempSrc:   PainSc: 0-No pain   Pain Goal:                   Abbiegail Landgren L Kiyani Jernigan

## 2020-11-24 DIAGNOSIS — N200 Calculus of kidney: Secondary | ICD-10-CM | POA: Diagnosis not present

## 2020-11-25 ENCOUNTER — Other Ambulatory Visit: Payer: Self-pay | Admitting: Internal Medicine

## 2020-11-25 DIAGNOSIS — M1990 Unspecified osteoarthritis, unspecified site: Secondary | ICD-10-CM

## 2020-12-11 ENCOUNTER — Encounter: Payer: Self-pay | Admitting: Internal Medicine

## 2020-12-22 DIAGNOSIS — R8271 Bacteriuria: Secondary | ICD-10-CM | POA: Diagnosis not present

## 2020-12-22 DIAGNOSIS — N200 Calculus of kidney: Secondary | ICD-10-CM | POA: Diagnosis not present

## 2020-12-24 ENCOUNTER — Encounter: Payer: Self-pay | Admitting: Internal Medicine

## 2021-01-19 ENCOUNTER — Encounter: Payer: Self-pay | Admitting: Adult Health

## 2021-02-06 ENCOUNTER — Encounter: Payer: Self-pay | Admitting: Adult Health

## 2021-02-06 DIAGNOSIS — Z03818 Encounter for observation for suspected exposure to other biological agents ruled out: Secondary | ICD-10-CM | POA: Diagnosis not present

## 2021-02-06 DIAGNOSIS — U071 COVID-19: Secondary | ICD-10-CM | POA: Diagnosis not present

## 2021-03-27 DIAGNOSIS — Z87442 Personal history of urinary calculi: Secondary | ICD-10-CM | POA: Insufficient documentation

## 2021-03-27 NOTE — Progress Notes (Signed)
Patient ID: Christine [name], female   DOB: 12-Feb-1955, 66 y.o.   MRN: [id number] ? ?ANNUAL WELLNESS VISIT AND FOLLOW UP ? ? ?Assessment and Plan: ? ?Annual Medicare Wellness Visit ?Due annually  ?Health maintenance reviewed ? ?Morbid obesity (HCC) BMI 45 -  ?- follow up 4 months for progress monitoring ?- increase whole plants, decrease processed carbs ?- long discussion about weight loss, diet, and exercise ?- doing well with lifestyle changes, steady progress, plans to restart ?- declines medications, did offer close follow ups for lifestyle accountability, will consider if not able to see steady progress with resuming previous changes per patient  ? ?Essential hypertension ?- continue medications, DASH diet, exercise and monitor at home. Call if greater than 130/80.  ?-     CBC with Differential/Platelet ?-     COMPLETE METABOLIC PANEL WITH GFR ?-     TSH ? ?Hyperlipidemia, unspecified hyperlipidemia type ?Continue medications ?LDL goal <100 ?Continue low cholesterol diet and exercise.  ?Check lipid panel.  ?-     Lipid panel ?-     TSH ? ?Medication management ?At each visit ? ?Vitamin D deficiency ?At goal at recent check; continue to recommend supplementation for goal of 60-100 ?Defer vitamin D level due to recent normal check ? ?Abnormal glucose (hx of prediabetes) ?Recent A1Cs at goal ?Discussed diet/exercise, weight management  ?- A1C ? ?Depression, major, in remission ([place]) ?In remission off of medications; monitor ?Lifestyle discussed: diet/exerise, sleep hygiene, stress management, hydration ? ?Hiatal hernia/ GERD ?Continue PPI/H2 blocker, diet discussed ? ?Fatty liver ?Weight loss advised, avoid alcohol/tylenol, will monitor LFTs ? ?Osteoarthritis ?Follows with Dr. Marlou Sa, taking tylenol/meloxicam (limited due to organ functions). Discussed topicals - voltaren, aspercreme QID, can wrap after application. Icing PRN. Continue with weight loss efforts.  ? ?History of kidney stones ?[facility]  following; push water ? ?Chronic bacteriuria/hematuria ?[facility] following ? ?B12 deficiency ?Recheck levels, with high dose supplement advised if elevated may need to reduce frequency to once a week,  ? ? ?Orders Placed This Encounter  ?Procedures  ? CBC with Differential/Platelet  ? COMPLETE METABOLIC PANEL WITH GFR  ? Magnesium  ? Lipid panel  ? TSH  ? Hemoglobin A1c  ? Vitamin B12  ? ? ?Over 40 min spent on chart review, critical decision making, counseling and exam. Discussed med's effects and SE's. Screening labs and tests as requested with regular follow-up as recommended. ? ?Future Appointments  ?Date Time Provider [place]  ?07/09/2021  9:00 AM Liane Comber, NP [facility] None  ?04/01/2022 11:00 AM Liane Comber, NP [facility] None  ? ? ?Plan:  ? ?During the course of the visit the patient was educated and counseled about appropriate screening and preventive services including:  ? ?Pneumococcal vaccine  ?Prevnar 13 ?Influenza vaccine ?Td vaccine ?Screening electrocardiogram ?Bone densitometry screening ?Colorectal cancer screening ?Diabetes screening ?Glaucoma screening ?Nutrition counseling  ?Advanced directives: requested ? ?HPI  ?66 y.o. female  presents for AWV and follow up. She has Hypertension; Hyperlipidemia; GERD (gastroesophageal reflux disease); Hiatal hernia; Morbid obesity ([place]); Arthritis of hip; Arthritis of shoulder region, right, degenerative; Medication management; Vitamin D deficiency; Abnormal glucose; Osteoarthritis; Recurrent major depressive disorder, in remission ([place]); B12 deficiency; CKD (chronic kidney disease) stage 2, GFR 60-89 ml/min; Bilateral knee pain; Fatty liver; Hematuria; Bacteriuria, chronic; and History of kidney stones on their problem list. ? ? ? ?She has had hip and shoulder replacement with Dr. Marlou Sa, doing well. Does have bil knee pain, arthritis, currently managing with meloxicam  15 mg every 3 days a week, tylenol PM at night. She supplements  with topical aspercreme.  ? ?She has hx of MDD, recently remains in remission off of medication.  ? ?She has chronic bacteriuria and recently with microscopic hematuria, kidney stones, underwent cysto/laser/ureteral stent placement in Nov 2022 with Dr. Jacalyn Lefevre. Oxybutynin is beneficial for frequency.  ? ?BMI is Body mass index is 45.54 kg/m?., she has been working on diet, admits minimal exercise but planning to start silver sneakers program. Cutting portions, reducing sugar and flour. She is not on medications, never started phentermine, lifestyle works when she sticks with it. Down from peak weight 294 lb in 2020.  ?Wt Readings from Last 3 Encounters:  ?03/31/21 249 lb (112.9 kg)  ?11/18/20 242 lb 1 oz (109.8 kg)  ?10/28/20 242 lb (109.8 kg)  ? ?Her blood pressure has been controlled at home, today their BP is BP: 110/78.  ? She does workout. She denies chest pain, shortness of breath, dizziness.  ? ?She is on cholesterol medication, she is taking crestor 20 mg daily, heart disease brother died 38, mother with MI in her 50's. Her cholesterol is at goal. The cholesterol last visit was:  ?Lab Results  ?Component Value Date  ? CHOL 127 10/14/2020  ? HDL 43 (L) 10/14/2020  ? [place] 59 10/14/2020  ? TRIG 170 (H) 10/14/2020  ? CHOLHDL 3.0 10/14/2020  ?Marland Kitchen ?She has been working on diet and exercise for hx of prediabetes (A1C 5.9% in 2016), she is not on bASA, she is not on ACE/ARB and denies foot ulcerations, hyperglycemia, hypoglycemia , increased appetite, nausea, paresthesia of the feet, polydipsia, polyuria, visual disturbances, vomiting and weight loss. Last A1C in the office was:  ?Lab Results  ?Component Value Date  ? HGBA1C 5.4 10/21/2020  ? ? Last GFR:  ?Lab Results  ?Component Value Date  ? EGFR 72 10/14/2020  ? ?Patient is on Vitamin D supplement,10,000.  ?Lab Results  ?Component Value Date  ? VD25OH 65 10/14/2020  ?   ?She has reduced B12 supplement dose, has 2500 mcg daily ?Lab Results  ?Component  Value Date  ? LXBWIOMB55 >2,000 (H) 07/09/2020  ? ? ? ? ? ?Current Medications:  ? ? ?Current Outpatient Medications (Cardiovascular):  ?  rosuvastatin (CRESTOR) 20 MG tablet, Take  1 tablet  Daily  for Cholesterol ?  valsartan-hydrochlorothiazide (DIOVAN-HCT) 320-12.5 MG tablet, Take 1 tablet Daily for BP ? ?Current Outpatient Medications (Respiratory):  ?  montelukast (SINGULAIR) 10 MG tablet, TAKE 1 TABLET AT BEDTIME (Patient taking differently: Take 10 mg by mouth daily as needed (pollen season).) ? ?Current Outpatient Medications (Analgesics):  ?  meloxicam (MOBIC) 15 MG tablet, TAKE 1/2 TO 1 TABLET DAILY AS NEEDED WITH FOOD FOR PAIN AND INFLAMMATION ?  traMADol (ULTRAM) 50 MG tablet, Take 1 tablet (50 mg total) by mouth every 6 (six) hours as needed. (Patient not taking: Reported on 03/31/2021) ? ?Current Outpatient Medications (Hematological):  ?  Cyanocobalamin (B-12) 2500 MCG SUBL, Place 1 tablet under the tongue once a week. ? ?Current Outpatient Medications (Other):  ?  Cholecalciferol (VITAMIN D) 125 MCG (5000 UT) CAPS, Take 10,000 Units by mouth daily. ?  cyclobenzaprine (FLEXERIL) 10 MG tablet, TAKE 1/2 1 TABLET BY MOUTH THREE TIMES A DAY AS NEEDED FOR MUSCLE SPASMS ?  diphenhydramine-acetaminophen (TYLENOL PM) 25-500 MG TABS, Take 2 tablets by mouth at bedtime. ?  magnesium gluconate (MAGONATE) 500 MG tablet, Take 500 mg by mouth daily. ?  oxybutynin (  DITROPAN) 5 MG tablet, TAKE 1 TABLET 3 TIMES A DAY (Patient taking differently: Take 5 mg by mouth 2 (two) times daily.) ?  Probiotic Product (PROBIOTIC DAILY PO), Take 1 tablet by mouth daily. ?  vitamin E 180 MG (400 UNITS) capsule, Take 1 capsule (400 Units total) by mouth daily. ? ?Health Maintenance:   ?Immunization History  ?Administered Date(s) Administered  ? Influenza Inj Mdck Quad Pf 11/02/2016, 10/23/2019  ? Influenza Inj Mdck Quad With Preservative 10/23/2018  ? Influenza, Quadrivalent, Recombinant, Inj, Pf 10/06/2017  ? Influenza,inj,Quad  PF,6+ Mos 10/14/2020  ? Influenza-Unspecified 11/22/2014, 10/31/2015, 10/06/2017  ? PFIZER(Purple Top)SARS-COV-2 Vaccination 04/30/2019, 05/21/2019, 11/23/2019  ? PPD Test 06/01/2013  ? Pneumococcal Polysacchari

## 2021-03-31 ENCOUNTER — Encounter: Payer: Self-pay | Admitting: Adult Health

## 2021-03-31 ENCOUNTER — Other Ambulatory Visit: Payer: Self-pay

## 2021-03-31 ENCOUNTER — Ambulatory Visit (INDEPENDENT_AMBULATORY_CARE_PROVIDER_SITE_OTHER): Payer: Medicare HMO | Admitting: Adult Health

## 2021-03-31 VITALS — BP 110/78 | HR 84 | Temp 97.2°F | Wt 249.0 lb

## 2021-03-31 DIAGNOSIS — E785 Hyperlipidemia, unspecified: Secondary | ICD-10-CM | POA: Diagnosis not present

## 2021-03-31 DIAGNOSIS — K760 Fatty (change of) liver, not elsewhere classified: Secondary | ICD-10-CM

## 2021-03-31 DIAGNOSIS — Z87442 Personal history of urinary calculi: Secondary | ICD-10-CM | POA: Diagnosis not present

## 2021-03-31 DIAGNOSIS — N182 Chronic kidney disease, stage 2 (mild): Secondary | ICD-10-CM

## 2021-03-31 DIAGNOSIS — K219 Gastro-esophageal reflux disease without esophagitis: Secondary | ICD-10-CM

## 2021-03-31 DIAGNOSIS — E538 Deficiency of other specified B group vitamins: Secondary | ICD-10-CM

## 2021-03-31 DIAGNOSIS — R7309 Other abnormal glucose: Secondary | ICD-10-CM

## 2021-03-31 DIAGNOSIS — F3340 Major depressive disorder, recurrent, in remission, unspecified: Secondary | ICD-10-CM

## 2021-03-31 DIAGNOSIS — M1990 Unspecified osteoarthritis, unspecified site: Secondary | ICD-10-CM

## 2021-03-31 DIAGNOSIS — Z79899 Other long term (current) drug therapy: Secondary | ICD-10-CM

## 2021-03-31 DIAGNOSIS — Z0000 Encounter for general adult medical examination without abnormal findings: Secondary | ICD-10-CM

## 2021-03-31 DIAGNOSIS — R6889 Other general symptoms and signs: Secondary | ICD-10-CM

## 2021-03-31 DIAGNOSIS — E559 Vitamin D deficiency, unspecified: Secondary | ICD-10-CM

## 2021-03-31 DIAGNOSIS — K449 Diaphragmatic hernia without obstruction or gangrene: Secondary | ICD-10-CM

## 2021-03-31 DIAGNOSIS — R69 Illness, unspecified: Secondary | ICD-10-CM | POA: Diagnosis not present

## 2021-03-31 DIAGNOSIS — I10 Essential (primary) hypertension: Secondary | ICD-10-CM

## 2021-03-31 DIAGNOSIS — Z0001 Encounter for general adult medical examination with abnormal findings: Secondary | ICD-10-CM | POA: Diagnosis not present

## 2021-04-01 ENCOUNTER — Encounter: Payer: Self-pay | Admitting: Adult Health

## 2021-04-01 LAB — COMPLETE METABOLIC PANEL WITH GFR
AG Ratio: 1.4 (calc) (ref 1.0–2.5)
ALT: 16 U/L (ref 6–29)
AST: 20 U/L (ref 10–35)
Albumin: 4.4 g/dL (ref 3.6–5.1)
Alkaline phosphatase (APISO): 66 U/L (ref 37–153)
BUN/Creatinine Ratio: 15 (calc) (ref 6–22)
BUN: 16 mg/dL (ref 7–25)
CO2: 24 mmol/L (ref 20–32)
Calcium: 9.9 mg/dL (ref 8.6–10.4)
Chloride: 102 mmol/L (ref 98–110)
Creat: 1.1 mg/dL — ABNORMAL HIGH (ref 0.50–1.05)
Globulin: 3.1 g/dL (calc) (ref 1.9–3.7)
Glucose, Bld: 82 mg/dL (ref 65–99)
Potassium: 4 mmol/L (ref 3.5–5.3)
Sodium: 139 mmol/L (ref 135–146)
Total Bilirubin: 0.9 mg/dL (ref 0.2–1.2)
Total Protein: 7.5 g/dL (ref 6.1–8.1)
eGFR: 56 mL/min/{1.73_m2} — ABNORMAL LOW (ref >=60)

## 2021-04-01 LAB — CBC WITH DIFFERENTIAL/PLATELET
Absolute Monocytes: 640 cells/uL (ref 200–950)
Basophils Absolute: 56 cells/uL (ref 0–200)
Basophils Relative: 0.7 %
Eosinophils Absolute: 280 cells/uL (ref 15–500)
Eosinophils Relative: 3.5 %
HCT: 42.1 % (ref 35.0–45.0)
Hemoglobin: 14 g/dL (ref 11.7–15.5)
Lymphs Abs: 1600 cells/uL (ref 850–3900)
MCH: 30.4 pg (ref 27.0–33.0)
MCHC: 33.3 g/dL (ref 32.0–36.0)
MCV: 91.3 fL (ref 80.0–100.0)
MPV: 10.7 fL (ref 7.5–12.5)
Monocytes Relative: 8 %
Neutro Abs: 5424 cells/uL (ref 1500–7800)
Neutrophils Relative %: 67.8 %
Platelets: 248 10*3/uL (ref 140–400)
RBC: 4.61 10*6/uL (ref 3.80–5.10)
RDW: 13.1 % (ref 11.0–15.0)
Total Lymphocyte: 20 %
WBC: 8 10*3/uL (ref 3.8–10.8)

## 2021-04-01 LAB — LIPID PANEL
Cholesterol: 112 mg/dL (ref <200)
HDL: 43 mg/dL — ABNORMAL LOW (ref >=50)
LDL Cholesterol (Calc): 48 mg/dL (calc)
Non-HDL Cholesterol (Calc): 69 mg/dL (calc) (ref <130)
Total CHOL/HDL Ratio: 2.6 (calc) (ref <5.0)
Triglycerides: 124 mg/dL (ref <150)

## 2021-04-01 LAB — TSH: TSH: 1.72 mIU/L (ref 0.40–4.50)

## 2021-04-01 LAB — MAGNESIUM: Magnesium: 2 mg/dL (ref 1.5–2.5)

## 2021-04-01 LAB — VITAMIN B12: Vitamin B-12: 1843 pg/mL — ABNORMAL HIGH (ref 200–1100)

## 2021-04-01 LAB — HEMOGLOBIN A1C
Hgb A1c MFr Bld: 5.5 % of total Hgb (ref <5.7)
Mean Plasma Glucose: 111 mg/dL
eAG (mmol/L): 6.2 mmol/L

## 2021-04-06 ENCOUNTER — Other Ambulatory Visit: Payer: Self-pay | Admitting: Adult Health

## 2021-04-06 DIAGNOSIS — Z1231 Encounter for screening mammogram for malignant neoplasm of breast: Secondary | ICD-10-CM

## 2021-05-18 ENCOUNTER — Other Ambulatory Visit: Payer: Self-pay | Admitting: Internal Medicine

## 2021-05-18 DIAGNOSIS — I10 Essential (primary) hypertension: Secondary | ICD-10-CM

## 2021-05-18 DIAGNOSIS — E782 Mixed hyperlipidemia: Secondary | ICD-10-CM

## 2021-05-19 DIAGNOSIS — H25013 Cortical age-related cataract, bilateral: Secondary | ICD-10-CM | POA: Diagnosis not present

## 2021-05-19 DIAGNOSIS — H5203 Hypermetropia, bilateral: Secondary | ICD-10-CM | POA: Diagnosis not present

## 2021-07-09 ENCOUNTER — Encounter: Payer: Self-pay | Admitting: Adult Health

## 2021-07-09 ENCOUNTER — Ambulatory Visit (INDEPENDENT_AMBULATORY_CARE_PROVIDER_SITE_OTHER): Payer: Medicare HMO | Admitting: Adult Health

## 2021-07-09 VITALS — BP 124/72 | HR 78 | Temp 96.8°F | Ht 62.0 in | Wt 255.0 lb

## 2021-07-09 DIAGNOSIS — E785 Hyperlipidemia, unspecified: Secondary | ICD-10-CM | POA: Diagnosis not present

## 2021-07-09 DIAGNOSIS — Z0000 Encounter for general adult medical examination without abnormal findings: Secondary | ICD-10-CM | POA: Diagnosis not present

## 2021-07-09 DIAGNOSIS — Z79899 Other long term (current) drug therapy: Secondary | ICD-10-CM

## 2021-07-09 DIAGNOSIS — Z136 Encounter for screening for cardiovascular disorders: Secondary | ICD-10-CM

## 2021-07-09 DIAGNOSIS — K760 Fatty (change of) liver, not elsewhere classified: Secondary | ICD-10-CM | POA: Diagnosis not present

## 2021-07-09 DIAGNOSIS — I10 Essential (primary) hypertension: Secondary | ICD-10-CM

## 2021-07-09 DIAGNOSIS — R7309 Other abnormal glucose: Secondary | ICD-10-CM | POA: Diagnosis not present

## 2021-07-09 DIAGNOSIS — N182 Chronic kidney disease, stage 2 (mild): Secondary | ICD-10-CM | POA: Diagnosis not present

## 2021-07-09 DIAGNOSIS — Z124 Encounter for screening for malignant neoplasm of cervix: Secondary | ICD-10-CM | POA: Diagnosis not present

## 2021-07-09 DIAGNOSIS — Z0001 Encounter for general adult medical examination with abnormal findings: Secondary | ICD-10-CM | POA: Diagnosis not present

## 2021-07-09 DIAGNOSIS — Z131 Encounter for screening for diabetes mellitus: Secondary | ICD-10-CM

## 2021-07-09 DIAGNOSIS — Z1329 Encounter for screening for other suspected endocrine disorder: Secondary | ICD-10-CM | POA: Diagnosis not present

## 2021-07-09 DIAGNOSIS — R319 Hematuria, unspecified: Secondary | ICD-10-CM | POA: Diagnosis not present

## 2021-07-09 DIAGNOSIS — F3340 Major depressive disorder, recurrent, in remission, unspecified: Secondary | ICD-10-CM

## 2021-07-09 DIAGNOSIS — E538 Deficiency of other specified B group vitamins: Secondary | ICD-10-CM | POA: Diagnosis not present

## 2021-07-09 DIAGNOSIS — K219 Gastro-esophageal reflux disease without esophagitis: Secondary | ICD-10-CM

## 2021-07-09 DIAGNOSIS — E559 Vitamin D deficiency, unspecified: Secondary | ICD-10-CM | POA: Diagnosis not present

## 2021-07-09 DIAGNOSIS — R1310 Dysphagia, unspecified: Secondary | ICD-10-CM | POA: Insufficient documentation

## 2021-07-09 NOTE — Progress Notes (Signed)
07/13/2021 Christine [name] [id number] 03/24/1955  Referring provider: [name], MD Primary GI doctor: Dr. Hilarie Fredrickson  ASSESSMENT AND PLAN:   Gastroesophageal reflux disease, unspecified whether esophagitis present with dysphagia x 1 month with meats, history of hiatal hernia and remote dilatation with Dr. Velora Heckler  Will start the patient on a PPI, will schedule for EGD Lifestyle changes discussed, avoid NSAIDS, ETOH EGD to evaluate for structural abnormality, tumor, erosive/infectious esophagitis, strictures, H pylori, PUD Takes mobic 3 days a week.  If the EGD is negative can then proceed to barium swallow and/or esophageal manometry. I discussed risks of EGD with patient today, including risk of sedation, bleeding or perforation.  Patient provides understanding and gave verbal consent to proceed.  Fatty liver --Continue to work on risk factor modification including diet exercise and control of risk factors including blood sugars. - monitor q 6 months.   Morbid obesity  Body mass index is 46.82 kg/m.  -Patient has been advised to make an attempt to improve diet and exercise patterns to aid in weight loss. -Recommended diet heavy in fruits and veggies and low in animal meats, cheeses, and dairy products, appropriate calorie intake  Family history of colon cancer Colonoscopy 09/22/2020- Dr. Hilarie Fredrickson, no polyps, diverticula, 5 year recall due to family hx.  History of Present Illness:  66 y.o. female  with a past medical history of hypertension, hyperlipidemia, CKD stage II, morbid obesity, B12 deficiency, hiatal hernia with reflux, dysphagia and others listed below, returns to clinic today for evaluation of dysphagia. Patient states she has had a EGD remotely in 71s with dilatation. Does have history of hiatal hernia with reflux, on Mobic 15 mg as needed, patient not currently on PPI. Having intermittent dysphagia with meats for the past month. Colonoscopy 09/22/2020-  Dr. Hilarie Fredrickson, no polyps, diverticula, 5 year recall due to family hx.  Patient states [place] and had dysphagia. Patient regurgitated pork chop, no nausea or vomiting.   Has not happened since that time but she is chewing her food well.  She states Dr. Velora Heckler 20 years ago stretched her throat and she was told she had a hiatal hernia.  She has GERD with certain foods, has hoarseness in the AM, some clearing of throat.  She takes mobic Monday, Wednesday and Friday.  She has been actively losing weight.  Was on prevacid in the past and did well that.  No ETOH, no smoking history.     Current Medications:    Current Outpatient Medications (Cardiovascular):    rosuvastatin (CRESTOR) 20 MG tablet, TAKE 1 TABLET DAILY FOR    CHOLESTEROL   valsartan-hydrochlorothiazide (DIOVAN-HCT) 320-12.5 MG tablet, TAKE 1 TABLET DAILY FOR    BLOOD PRESSURE  Current Outpatient Medications (Respiratory):    montelukast (SINGULAIR) 10 MG tablet, TAKE 1 TABLET AT BEDTIME (Patient not taking: Reported on 07/13/2021)  Current Outpatient Medications (Analgesics):    meloxicam (MOBIC) 15 MG tablet, TAKE 1/2 TO 1 TABLET DAILY AS NEEDED WITH FOOD FOR PAIN AND INFLAMMATION   traMADol (ULTRAM) 50 MG tablet, Take 1 tablet (50 mg total) by mouth every 6 (six) hours as needed.  Current Outpatient Medications (Hematological):    Cyanocobalamin (B-12) 2500 MCG SUBL, Place 1 tablet under the tongue once a week.  Current Outpatient Medications (Other):    Cholecalciferol (VITAMIN D) 125 MCG (5000 UT) CAPS, Take 10,000 Units by mouth daily.   cyclobenzaprine (FLEXERIL) 10 MG tablet, TAKE  1/2 1 TABLET BY MOUTH THREE TIMES A DAY AS NEEDED FOR MUSCLE SPASMS   diphenhydramine-acetaminophen (TYLENOL PM) 25-500 MG TABS, Take 2 tablets by mouth at bedtime.   magnesium gluconate (MAGONATE) 500 MG tablet, Take 500 mg by mouth daily.   oxybutynin (DITROPAN) 5 MG tablet, TAKE 1  TABLET 3 TIMES A DAY (Patient taking differently: Take 5 mg by mouth 2 (two) times daily.)   pantoprazole (PROTONIX) 40 MG tablet, Take 1 tablet (40 mg total) by mouth daily.   Probiotic Product (PROBIOTIC DAILY PO), Take 1 tablet by mouth daily.   vitamin E 180 MG (400 UNITS) capsule, Take 1 capsule (400 Units total) by mouth daily.  Surgical History:  She  has a past surgical history that includes Cholecystectomy (1987); Esophageal dilation; Knee arthroscopy (Right, 2007); Total hip arthroplasty (Right, 03/19/2014); Reverse shoulder arthroplasty (Right, 06/04/2014); Breast biopsy (Left); Upper gastrointestinal endoscopy; Colonoscopy; Eye surgery; Wisdom tooth extraction; Cystoscopy with retrograde pyelogram, ureteroscopy and stent placement (Right, 10/28/2020); Holmium laser application (Right, 35/59/7416); and Cystoscopy/ureteroscopy/holmium laser/stent placement (Bilateral, 11/18/2020). Family History:  Her family history includes Cancer in her father; Colon cancer in her mother; Esophageal cancer in her father; Heart disease in her brother and mother; Hyperlipidemia in her mother; Hypertension in her mother; Pulmonary embolism in her brother; Stroke in her mother; Throat cancer in her father. Social History:   reports that she has never smoked. She has never used smokeless tobacco. She reports that she does not drink alcohol and does not use drugs.  Current Medications, Allergies, Past Medical History, Past Surgical History, Family History and Social History were reviewed in [employer] record.  Physical Exam: BP 106/62   Pulse 82   Ht '5\' 2"'$  (1.575 m)   Wt 256 lb (116.1 kg)   LMP 10/18/2013   BMI 46.82 kg/m  General:   Pleasant, well developed female in no acute distress Heart : Regular rate and rhythm; no murmurs Pulm: Clear anteriorly; no wheezing Abdomen:  Soft, Obese AB, Active bowel sounds. No tenderness . , No organomegaly appreciated. Rectal: Not  evaluated Extremities:  without  edema. Neurologic:  Alert and  oriented x4;  No focal deficits.  Psych:  Cooperative. Normal mood and affect.   Christine Crofts, PA-C 07/13/21

## 2021-07-09 NOTE — Progress Notes (Signed)
Patient ID: Christine [name], female   DOB: 1955/10/15, 67 y.o.   MRN: [id number]  Encounter for Annual Physical Exam   Assessment and Plan:  Encounter for Annual Physical Exam with abnormal findings Due annually  Health Maintenance reviewed Healthy lifestyle reviewed and goals set - mammogram as scheduled - Shingrix at pharmacy   Morbid obesity (Christine [name]) BMI 45 -  - follow up 4 months for progress monitoring - increase whole plants, decrease processed carbs - long discussion about weight loss, diet, and exercise - doing well with lifestyle changes, steady progress, plans to restart - declines medications, did offer close follow ups for lifestyle accountability, will consider if not able to see steady progress with resuming previous changes per patient   Essential hypertension - continue medications, DASH diet, exercise and monitor at home. Call if greater than 130/80.  -     CBC with Differential/Platelet -     COMPLETE METABOLIC PANEL WITH GFR -     TSH -     UA/microalbumin  Hyperlipidemia, unspecified hyperlipidemia type Continue medications LDL goal <100 Continue low cholesterol diet and exercise.  Check lipid panel.  -     Lipid panel -     TSH  Medication management At each visit  Vitamin D deficiency At goal at recent check; continue to recommend supplementation for goal of 60-100 Check vitamin D level   Abnormal glucose (hx of prediabetes) Recent A1Cs at goal Discussed diet/exercise, weight management  - A1C  Depression, major, in remission (Christine [name]) In remission off of medications; monitor Lifestyle discussed: diet/exerise, sleep hygiene, stress management, hydration  Hiatal hernia/ GERD Continue PPI/H2 blocker, diet discussed  Fatty liver Weight loss advised, avoid alcohol/tylenol, will monitor LFTs - CMP/GFR  Osteoarthritis Follows with Dr. Marlou Sa, taking tylenol/meloxicam (limited due to organ functions). Discussed topicals - voltaren, aspercreme  QID, can wrap after application. Icing PRN. Continue with weight loss efforts.  - advised follow up this year, R knee ROM significantly limited  History of kidney stones [facility] following; push water  Chronic bacteriuria/hematuria [facility] following  B12 deficiency Recheck levels, with high dose supplement advised if elevated may need to reduce frequency to once a week,  - B12  Cervical cancer screening Pain limits exam, sent vaginal specimen/HPV Reports never sexually active, risk is very low, would not recommend another attempt  Orders Placed This Encounter  Procedures   CBC with Differential/Platelet   COMPLETE METABOLIC PANEL WITH GFR   Magnesium   Lipid panel   TSH   Hemoglobin A1c   VITAMIN D 25 Hydroxy (Vit-D Deficiency, Fractures)   Vitamin B12   Microalbumin / creatinine urine ratio   Urinalysis, Routine w reflex microscopic   EKG 12-Lead    Over 40 min spent on chart review, critical decision making, counseling and exam. Discussed med's effects and SE's. Screening labs and tests as requested with regular follow-up as recommended.  Future Appointments  Date Time Provider [place]  07/13/2021  9:00 AM Delfina Redwood [facility]  07/30/2021 10:30 AM [facility] 2 [facility]  04/01/2022 11:00 AM Darrol Jump, NP [facility] None  07/12/2022  9:00 AM Darrol Jump, NP [facility] None    Plan:   During the course of the visit the patient was educated and counseled about appropriate screening and preventive services including:   Pneumococcal vaccine  Prevnar 13 Influenza vaccine Td vaccine Screening electrocardiogram Bone densitometry screening Colorectal cancer screening Diabetes screening Glaucoma screening Nutrition counseling  Advanced  directives: requested  HPI  66 y.o. female  presents for CPE and follow up. She has Hypertension; Hyperlipidemia; GERD (gastroesophageal reflux disease); Hiatal hernia;  Morbid obesity (Christine [name]); Arthritis of hip; Arthritis of shoulder region, right, degenerative; Medication management; Vitamin D deficiency; Abnormal glucose; Osteoarthritis; Recurrent major depressive disorder, in remission (Christine [name]); B12 deficiency; CKD (chronic kidney disease) stage 2, GFR 60-89 ml/min; Bilateral knee pain; Fatty liver; Hematuria; Bacteriuria, chronic; History of kidney stones; and Dysphagia on their problem list.    She has had hip and shoulder replacement with Dr. Marlou Sa, doing well. Does have bil knee pain, arthritis, currently managing with meloxicam 15 mg every 3 days a week, tylenol PM at night. She supplements with topical aspercreme.   She has hx of MDD, recently remains in remission off of medication.   She has chronic bacteriuria and recently with microscopic hematuria, kidney stones, underwent cysto/laser/ureteral stent placement in Nov 2022 with Dr. Jacalyn [name], has follow up next week. Oxybutynin is beneficial for frequency.   She notes has been having dysphagia episodes with meat x 1 month intermittently, has appointment with Christine [name]  to discuss EGD. She reports remote hx of esophageal dilation in her 17s.   BMI is Body mass index is 46.64 kg/m., she has been working on diet, admits minimal exercise but planning to start silver sneakers program. Cutting portions, reducing sugar and flour. She is not on medications, never started phentermine, lifestyle works when she sticks with it. Down from peak weight 294 lb in 2020.  Wt Readings from Last 3 Encounters:  07/09/21 255 lb (115.7 kg)  03/31/21 249 lb (112.9 kg)  11/18/20 242 lb 1 oz (109.8 kg)   Her blood pressure has been controlled at home, today their BP is BP: 124/72.   She does workout. She denies chest pain, shortness of breath, dizziness.   She is on cholesterol medication, she is taking crestor 20 mg daily, heart disease brother died 52, mother with MI in her 39's. Her cholesterol is at goal <70. The cholesterol  last visit was:  Lab Results  Component Value Date   CHOL 112 03/31/2021   HDL 43 (L) 03/31/2021   LDLCALC 48 03/31/2021   TRIG 124 03/31/2021   CHOLHDL 2.6 03/31/2021  . She has been working on diet and exercise for hx of prediabetes (A1C 5.9% in 2016), she is not on bASA, she is not on ACE/ARB and denies foot ulcerations, hyperglycemia, hypoglycemia , increased appetite, nausea, paresthesia of the feet, polydipsia, polyuria, visual disturbances, vomiting and weight loss. Last A1C in the office was:  Lab Results  Component Value Date   HGBA1C 5.5 03/31/2021    Last GFR:  Lab Results  Component Value Date   EGFR 56 (L) 03/31/2021   EGFR 72 10/14/2020   Patient is on Vitamin D supplement,10,000.  Lab Results  Component Value Date   VD25OH 65 10/14/2020     She has reduced B12 supplement dose, has reduced from 2500 mcg daily to 1-2 days a week. Lab Results  Component Value Date   LOVFIEPP29 5,188 (H) 03/31/2021       Current Medications:    Current Outpatient Medications (Cardiovascular):    rosuvastatin (CRESTOR) 20 MG tablet, TAKE 1 TABLET DAILY FOR    CHOLESTEROL   valsartan-hydrochlorothiazide (DIOVAN-HCT) 320-12.5 MG tablet, TAKE 1 TABLET DAILY FOR    BLOOD PRESSURE  Current Outpatient Medications (Respiratory):    montelukast (SINGULAIR) 10 MG tablet, TAKE 1 TABLET AT BEDTIME (Patient taking differently: Take 10  mg by mouth daily as needed (pollen season).)  Current Outpatient Medications (Analgesics):    meloxicam (MOBIC) 15 MG tablet, TAKE 1/2 TO 1 TABLET DAILY AS NEEDED WITH FOOD FOR PAIN AND INFLAMMATION   traMADol (ULTRAM) 50 MG tablet, Take 1 tablet (50 mg total) by mouth every 6 (six) hours as needed.  Current Outpatient Medications (Hematological):    Cyanocobalamin (B-12) 2500 MCG SUBL, Place 1 tablet under the tongue once a week.  Current Outpatient Medications (Other):    Cholecalciferol (VITAMIN D) 125 MCG (5000 UT) CAPS, Take 10,000 Units by mouth  daily.   cyclobenzaprine (FLEXERIL) 10 MG tablet, TAKE 1/2 1 TABLET BY MOUTH THREE TIMES A DAY AS NEEDED FOR MUSCLE SPASMS   diphenhydramine-acetaminophen (TYLENOL PM) 25-500 MG TABS, Take 2 tablets by mouth at bedtime.   magnesium gluconate (MAGONATE) 500 MG tablet, Take 500 mg by mouth daily.   oxybutynin (DITROPAN) 5 MG tablet, TAKE 1 TABLET 3 TIMES A DAY (Patient taking differently: Take 5 mg by mouth 2 (two) times daily.)   Probiotic Product (PROBIOTIC DAILY PO), Take 1 tablet by mouth daily.   vitamin E 180 MG (400 UNITS) capsule, Take 1 capsule (400 Units total) by mouth daily.  Health Maintenance:   Immunization History  Administered Date(s) Administered   Influenza Inj Mdck Quad Pf 11/02/2016, 10/23/2019   Influenza Inj Mdck Quad With Preservative 10/23/2018   Influenza, Quadrivalent, Recombinant, Inj, Pf 10/06/2017   Influenza,inj,Quad PF,6+ Mos 10/14/2020   Influenza-Unspecified 11/22/2014, 10/31/2015, 10/06/2017   PFIZER(Purple Top)SARS-COV-2 Vaccination 04/30/2019, 05/21/2019, 11/23/2019   PPD Test 06/01/2013   Pneumococcal Polysaccharide-23 06/08/2018   Tdap 03/22/2017   Health Maintenance  Topic Date Due   Zoster Vaccines- Shingrix (1 of 2) Never done   COVID-19 Vaccine (4 - Booster for [employer] series) 07/25/2021 (Originally 01/18/2020)   INFLUENZA VACCINE  08/04/2021   PAP SMEAR-Modifier  04/29/2022   MAMMOGRAM  07/30/2022   COLONOSCOPY (Pts 45-30yrs Insurance coverage will need to be confirmed)  09/22/2025   TETANUS/TDAP  03/23/2027   Hepatitis C Screening  Completed   HIV Screening  Completed   HPV VACCINES  Aged Out   Prevnar 20 at 74 Shingrix: check with insurance, plans to get this year Covid 19: 3/3, pfizer, declines booster  Colonoscopy 09/22/2020- Dr. Hilarie Fredrickson, no polyps, diverticula, 5 year recall due to family hx EGD: has upcoming planned  MMG: [facility], has scheduled 07/30/2021 DEXA 05/2013 T 0.1 PAP 2019 normal, doing here,  neg HPV q 5 years, DUE TODAY  Last eye: Dr. Deon Pilling, glasses, last 05/2021, goes annually, monitoring mild cataracts Last dental: overdue, last 3-4 years, Dr. Philipp Ovens retired, needs to schedule with Dr. Altamese Fruitland  Patient Care Team: [name], MD as PCP - General (Internal Medicine) Marlou Sa, Tonna Corner, MD as Consulting Physician (Orthopedic Surgery) Rolm Bookbinder, MD as Consulting Physician (Dermatology) Pyrtle, Lajuan Lines, MD as Consulting Physician (Gastroenterology)  Medical History:  Past Medical History:  Diagnosis Date   Allergy    Anemia    Arthritis    Asthma    as child but out grew   Cataract    small forming   Diverticula of colon    Environmental allergies    GERD (gastroesophageal reflux disease)    Headache    Hiatal hernia    with stricture dilation   Hyperlipidemia    down on meds   Hypertension    Kidney stones 2008   again 8- 2022   Obesity, unspecified  PONV (postoperative nausea and vomiting)    UTI (urinary tract infection)    Vertigo     Allergies Allergies  Allergen Reactions   Keflex [Cephalexin] Anaphylaxis and Swelling   Codeine Nausea And Vomiting   Wellbutrin [Bupropion] Diarrhea    SURGICAL HISTORY She  has a past surgical history that includes Cholecystectomy (1987); Esophageal dilation; Knee arthroscopy (Right, 2007); Total hip arthroplasty (Right, 03/19/2014); Reverse shoulder arthroplasty (Right, 06/04/2014); Breast biopsy (Left); Upper gastrointestinal endoscopy; Colonoscopy; Eye surgery; Wisdom tooth extraction; Cystoscopy with retrograde pyelogram, ureteroscopy and stent placement (Right, 10/28/2020); Holmium laser application (Right, 35/59/7416); and Cystoscopy/ureteroscopy/holmium laser/stent placement (Bilateral, 11/18/2020). FAMILY HISTORY Her family history includes Cancer in her father; Colon cancer in her mother; Esophageal cancer in her father; Heart disease in her brother and mother; Hyperlipidemia in her mother;  Hypertension in her mother; Pulmonary embolism in her brother; Stroke in her mother; Throat cancer in her father. SOCIAL HISTORY She  reports that she has never smoked. She has never used smokeless tobacco. She reports that she does not drink alcohol and does not use drugs.   Review of Systems: Review of Systems  Constitutional:  Negative for chills, fever, malaise/fatigue and weight loss.  HENT:  Negative for congestion, ear discharge, ear pain, hearing loss, sore throat and tinnitus.   Eyes: Negative.  Negative for blurred vision and double vision.  Respiratory:  Negative for cough, sputum production, shortness of breath and wheezing.   Cardiovascular:  Negative for chest pain, palpitations, orthopnea, claudication, leg swelling and PND.  Gastrointestinal:  Negative for abdominal pain, blood in stool, constipation, diarrhea, heartburn, melena, nausea and vomiting.  Genitourinary: Negative.  Negative for dysuria, flank pain, frequency, hematuria and urgency.  Musculoskeletal:  Positive for joint pain (knee pain). Negative for falls and myalgias.  Skin: Negative.  Negative for rash.  Neurological:  Negative for dizziness, tingling, sensory change, loss of consciousness, weakness and headaches.  Endo/Heme/Allergies:  Negative for polydipsia.  Psychiatric/Behavioral: Negative.  Negative for depression, hallucinations, memory loss, substance abuse and suicidal ideas. The patient is not nervous/anxious and does not have insomnia.   All other systems reviewed and are negative.   Physical Exam: Estimated body mass index is 46.64 kg/m as calculated from the following:   Height as of this encounter: $RemoveBeforeD'5\' 2"'QcvPJNRHYmsHXv$  (1.575 m).   Weight as of this encounter: 255 lb (115.7 kg). BP 124/72   Pulse 78   Temp (!) 96.8 F (36 C)   Ht $R'5\' 2"'Lq$  (1.575 m)   Wt 255 lb (115.7 kg)   LMP 10/18/2013   SpO2 97%   BMI 46.64 kg/m   General Appearance: Well nourished well developed, morbidly obese, in no apparent  distress.  Eyes: PERRLA, EOMs, conjunctiva no swelling or erythema ENT/Mouth: Ear canals normal without obstruction, swelling, erythema, or discharge.  TMs normal bilaterally with no erythema, bulging, retraction, or loss of landmark.  Neck: Supple, thyroid normal, No bruits.  No cervical adenopathy Respiratory: Respiratory effort normal, Breath sounds clear A&P without wheeze, rhonchi, rales.   Cardio: RRR without murmurs, rubs or gallops. Brisk peripheral pulses without edema.  Chest: symmetric, with normal excursions Breasts: defer - no concerns, has upcoming mammogram  Abdomen: Morbidly obese, pendulous, Soft, nontender, no guarding, rebound, hernias, palpable masses, or organomegaly.  Lymphatics: Non tender without lymphadenopathy.  Musculoskeletal: R knee mild valgus deformity ROM limited by body habitus, R knee with limited flexion past 90 degrees, 4/5 strength, and slow antalgic gait.  Skin: Warm, dry without rashes, lesions, ecchymosis.  Neuro: Awake and oriented X 3, Cranial nerves intact, reflexes equal bilaterally. Normal muscle tone, no cerebellar symptoms. Sensation intact.  Psych:  normal affect, Insight and Judgment appropriate.  GU: VULVA: normal appearing vulva with no masses, tenderness or lesions, exam limited by pain, could not tolerate speculum, PAP: Pap smear done today (vaginal swab only), bimanual - adnexa non-palpable, exam chaperoned by Chancy Hurter, CMA.  EKG: NSR, NSCPT  Izora Ribas, NP-C 9:41 AM [facility]

## 2021-07-09 NOTE — Addendum Note (Signed)
Addended by: Liane Comber C on: 07/09/2021 10:09 AM   Modules accepted: Orders

## 2021-07-09 NOTE — Patient Instructions (Addendum)
Christine [name] , Thank you for taking time to come for your Annual Wellness Visit. I appreciate your ongoing commitment to your health goals. Please review the following plan we discussed and let me know if I can assist you in the future.   These are the goals we discussed:  Goals      Weight (lb) < 240 lb (108.9 kg)        This is a list of the screening recommended for you and due dates:  Health Maintenance  Topic Date Due   Zoster (Shingles) Vaccine (1 of 2) Never done   COVID-19 Vaccine (4 - Booster for Pfizer series) 07/25/2021*   Flu Shot  08/04/2021   Pap Smear  04/29/2022   Mammogram  07/30/2022   Colon Cancer Screening  09/22/2025   Tetanus Vaccine  03/23/2027   Hepatitis C Screening: USPSTF Recommendation to screen - Ages 18-79 yo.  Completed   HIV Screening  Completed   HPV Vaccine  Aged Out  *Topic was postponed. The date shown is not the original due date.    Recommend to try tracking food intake - 2-4 weeks If having trouble getting through plateau, recommend to bring this log in for review Encourage to reduce starches/sugars (processed carb), focus on increasing high fiber foods.   Recommend to follow up with ortho for R knee sometime this year We can work on weight loss in the meantime in anticipation of likely needing a replacement    Know what a healthy weight is for you (roughly BMI <25) and aim to maintain this  Aim for 7+ servings of fruits and vegetables daily  65-80+ fluid ounces of water or unsweet tea for healthy kidneys  Limit to max 1 drink of alcohol per day; avoid smoking/tobacco  Limit animal fats in diet for cholesterol and heart health - choose grass fed whenever available  Avoid highly processed foods, and foods high in saturated/trans fats  Aim for low stress - take time to unwind and care for your mental health  Aim for 150 min of moderate intensity exercise weekly for heart health, and weights twice weekly for bone health  Aim for  7-9 hours of sleep daily     High-Fiber Eating Plan Fiber, also called dietary fiber, is a type of carbohydrate. It is found foods such as fruits, vegetables, whole grains, and beans. A high-fiber diet can have many health benefits. Your health care provider may recommend a high-fiber diet to help: Prevent constipation. Fiber can make your bowel movements more regular. Lower your cholesterol. Relieve the following conditions: Inflammation of veins in the anus (hemorrhoids). Inflammation of specific areas of the digestive tract (uncomplicated diverticulosis). A problem of the large intestine, also called the colon, that sometimes causes pain and diarrhea (irritable bowel syndrome, or IBS). Prevent overeating as part of a weight-loss plan. Prevent heart disease, type 2 diabetes, and certain cancers. What are tips for following this plan? Reading food labels  Check the nutrition facts label on food products for the amount of dietary fiber. Choose foods that have 5 grams of fiber or more per serving. The goals for recommended daily fiber intake include: Men (age 77 or younger): 34-38 g. Men (over age 53): 28-34 g. Women (age 46 or younger): 25-28 g. Women (over age 39): 22-25 g. Your daily fiber goal is _____________ g. Shopping Choose whole fruits and vegetables instead of processed forms, such as apple juice or applesauce. Choose a wide variety of high-fiber foods such  as avocados, lentils, oats, and kidney beans. Read the nutrition facts label of the foods you choose. Be aware of foods with added fiber. These foods often have high sugar and sodium amounts per serving. Cooking Use whole-grain flour for baking and cooking. Cook with brown rice instead of white rice. Meal planning Start the day with a breakfast that is high in fiber, such as a cereal that contains 5 g of fiber or more per serving. Eat breads and cereals that are made with whole-grain flour instead of refined flour or  white flour. Eat brown rice, bulgur wheat, or millet instead of white rice. Use beans in place of meat in soups, salads, and pasta dishes. Be sure that half of the grains you eat each day are whole grains. General information You can get the recommended daily intake of dietary fiber by: Eating a variety of fruits, vegetables, grains, nuts, and beans. Taking a fiber supplement if you are not able to take in enough fiber in your diet. It is better to get fiber through food than from a supplement. Gradually increase how much fiber you consume. If you increase your intake of dietary fiber too quickly, you may have bloating, cramping, or gas. Drink plenty of water to help you digest fiber. Choose high-fiber snacks, such as berries, raw vegetables, nuts, and popcorn. What foods should I eat? Fruits Berries. Pears. Apples. Oranges. Avocado. Prunes and raisins. Dried figs. Vegetables Sweet potatoes. Spinach. Kale. Artichokes. Cabbage. Broccoli. Cauliflower. Green peas. Carrots. Squash. Grains Whole-grain breads. Multigrain cereal. Oats and oatmeal. Brown rice. Barley. Bulgur wheat. [place]. Quinoa. Bran muffins. Popcorn. Rye wafer crackers. Meats and other proteins Navy beans, kidney beans, and pinto beans. Soybeans. Split peas. Lentils. Nuts and seeds. Dairy Fiber-fortified yogurt. Beverages Fiber-fortified soy milk. Fiber-fortified orange juice. Other foods Fiber bars. The items listed above may not be a complete list of recommended foods and beverages. Contact a dietitian for more information. What foods should I avoid? Fruits Fruit juice. Cooked, strained fruit. Vegetables Fried potatoes. Canned vegetables. Well-cooked vegetables. Grains White bread. Pasta made with refined flour. White rice. Meats and other proteins Fatty cuts of meat. Fried chicken or fried fish. Dairy Milk. Yogurt. Cream cheese. Sour cream. Fats and oils Butters. Beverages Soft drinks. Other foods Cakes and  pastries. The items listed above may not be a complete list of foods and beverages to avoid. Talk with your dietitian about what choices are best for you. Summary Fiber is a type of carbohydrate. It is found in foods such as fruits, vegetables, whole grains, and beans. A high-fiber diet has many benefits. It can help to prevent constipation, lower blood cholesterol, aid weight loss, and reduce your risk of heart disease, diabetes, and certain cancers. Increase your intake of fiber gradually. Increasing fiber too quickly may cause cramping, bloating, and gas. Drink plenty of water while you increase the amount of fiber you consume. The best sources of fiber include whole fruits and vegetables, whole grains, nuts, seeds, and beans. This information is not intended to replace advice given to you by your health care provider. Make sure you discuss any questions you have with your health care provider. Document Revised: 04/26/2019 Document Reviewed: 04/26/2019 Elsevier Patient Education  [place].

## 2021-07-10 ENCOUNTER — Encounter: Payer: Self-pay | Admitting: Adult Health

## 2021-07-10 LAB — COMPLETE METABOLIC PANEL WITH GFR
AG Ratio: 1.4 (calc) (ref 1.0–2.5)
ALT: 18 U/L (ref 6–29)
AST: 22 U/L (ref 10–35)
Albumin: 4.3 g/dL (ref 3.6–5.1)
Alkaline phosphatase (APISO): 62 U/L (ref 37–153)
BUN/Creatinine Ratio: 14 (calc) (ref 6–22)
BUN: 15 mg/dL (ref 7–25)
CO2: 25 mmol/L (ref 20–32)
Calcium: 10 mg/dL (ref 8.6–10.4)
Chloride: 101 mmol/L (ref 98–110)
Creat: 1.07 mg/dL — ABNORMAL HIGH (ref 0.50–1.05)
Globulin: 3 g/dL (calc) (ref 1.9–3.7)
Glucose, Bld: 92 mg/dL (ref 65–99)
Potassium: 4.5 mmol/L (ref 3.5–5.3)
Sodium: 137 mmol/L (ref 135–146)
Total Bilirubin: 0.6 mg/dL (ref 0.2–1.2)
Total Protein: 7.3 g/dL (ref 6.1–8.1)
eGFR: 58 mL/min/{1.73_m2} — ABNORMAL LOW (ref >=60)

## 2021-07-10 LAB — HEMOGLOBIN A1C
Hgb A1c MFr Bld: 5.3 % of total Hgb (ref <5.7)
Mean Plasma Glucose: 105 mg/dL
eAG (mmol/L): 5.8 mmol/L

## 2021-07-10 LAB — URINALYSIS, ROUTINE W REFLEX MICROSCOPIC
Bilirubin Urine: NEGATIVE
Glucose, UA: NEGATIVE
Hyaline Cast: NONE SEEN /LPF
Ketones, ur: NEGATIVE
Nitrite: NEGATIVE
Protein, ur: NEGATIVE
RBC / HPF: NONE SEEN /HPF (ref 0–2)
Specific Gravity, Urine: 1.005 (ref 1.001–1.035)
WBC, UA: >=60 /HPF — AB (ref 0–5)
pH: 6 (ref 5.0–8.0)

## 2021-07-10 LAB — MICROALBUMIN / CREATININE URINE RATIO
Creatinine, Urine: 38 mg/dL (ref 20–275)
Microalb Creat Ratio: 32 mcg/mg creat — ABNORMAL HIGH (ref <30)
Microalb, Ur: 1.2 mg/dL

## 2021-07-10 LAB — MAGNESIUM: Magnesium: 2 mg/dL (ref 1.5–2.5)

## 2021-07-10 LAB — CBC WITH DIFFERENTIAL/PLATELET
Absolute Monocytes: 593 cells/uL (ref 200–950)
Basophils Absolute: 77 cells/uL (ref 0–200)
Basophils Relative: 1 %
Eosinophils Absolute: 470 cells/uL (ref 15–500)
Eosinophils Relative: 6.1 %
HCT: 42 % (ref 35.0–45.0)
Hemoglobin: 13.6 g/dL (ref 11.7–15.5)
Lymphs Abs: 1756 cells/uL (ref 850–3900)
MCH: 29.4 pg (ref 27.0–33.0)
MCHC: 32.4 g/dL (ref 32.0–36.0)
MCV: 90.7 fL (ref 80.0–100.0)
MPV: 10.5 fL (ref 7.5–12.5)
Monocytes Relative: 7.7 %
Neutro Abs: 4805 cells/uL (ref 1500–7800)
Neutrophils Relative %: 62.4 %
Platelets: 224 10*3/uL (ref 140–400)
RBC: 4.63 10*6/uL (ref 3.80–5.10)
RDW: 13.1 % (ref 11.0–15.0)
Total Lymphocyte: 22.8 %
WBC: 7.7 10*3/uL (ref 3.8–10.8)

## 2021-07-10 LAB — LIPID PANEL
Cholesterol: 120 mg/dL (ref <200)
HDL: 42 mg/dL — ABNORMAL LOW (ref >=50)
LDL Cholesterol (Calc): 52 mg/dL (calc)
Non-HDL Cholesterol (Calc): 78 mg/dL (calc) (ref <130)
Total CHOL/HDL Ratio: 2.9 (calc) (ref <5.0)
Triglycerides: 180 mg/dL — ABNORMAL HIGH (ref <150)

## 2021-07-10 LAB — TSH: TSH: 2.22 mIU/L (ref 0.40–4.50)

## 2021-07-10 LAB — VITAMIN B12: Vitamin B-12: 1442 pg/mL — ABNORMAL HIGH (ref 200–1100)

## 2021-07-10 LAB — VITAMIN D 25 HYDROXY (VIT D DEFICIENCY, FRACTURES): Vit D, 25-Hydroxy: 57 ng/mL (ref 30–100)

## 2021-07-11 ENCOUNTER — Encounter: Payer: Self-pay | Admitting: Adult Health

## 2021-07-13 ENCOUNTER — Ambulatory Visit: Payer: Medicare HMO | Admitting: Physician Assistant

## 2021-07-13 ENCOUNTER — Encounter: Payer: Self-pay | Admitting: Adult Health

## 2021-07-13 ENCOUNTER — Encounter: Payer: Self-pay | Admitting: Physician Assistant

## 2021-07-13 ENCOUNTER — Other Ambulatory Visit: Payer: Self-pay | Admitting: Nurse Practitioner

## 2021-07-13 VITALS — BP 106/62 | HR 82 | Ht 62.0 in | Wt 256.0 lb

## 2021-07-13 DIAGNOSIS — Z800 Family history of malignant neoplasm of digestive organs: Secondary | ICD-10-CM

## 2021-07-13 DIAGNOSIS — K219 Gastro-esophageal reflux disease without esophagitis: Secondary | ICD-10-CM

## 2021-07-13 DIAGNOSIS — R1319 Other dysphagia: Secondary | ICD-10-CM

## 2021-07-13 DIAGNOSIS — K760 Fatty (change of) liver, not elsewhere classified: Secondary | ICD-10-CM | POA: Diagnosis not present

## 2021-07-13 DIAGNOSIS — I10 Essential (primary) hypertension: Secondary | ICD-10-CM

## 2021-07-13 DIAGNOSIS — K449 Diaphragmatic hernia without obstruction or gangrene: Secondary | ICD-10-CM | POA: Diagnosis not present

## 2021-07-13 LAB — PAP, TP IMAGING W/ HPV RNA, RFLX HPV TYPE 16,18/45: HPV DNA High Risk: NOT DETECTED

## 2021-07-13 LAB — PAP, TP IMAGING, WNL RFLX HPV

## 2021-07-13 MED ORDER — VALSARTAN 320 MG PO TABS: 320.0000 mg | ORAL_TABLET | Freq: Every day | ORAL | 3 refills | Status: DC

## 2021-07-13 MED ORDER — PANTOPRAZOLE SODIUM 40 MG PO TBEC: 40.0000 mg | DELAYED_RELEASE_TABLET | Freq: Every day | ORAL | 0 refills | Status: DC

## 2021-07-13 NOTE — Patient Instructions (Addendum)
Dysphagia precautions:  1. Take reflux medications 30+ minutes before other po intake, in the morning (Pantoprazole). 2. Begin meals with warm beverage 3. Eat smaller more frequent meals 4. Eat slowly, taking small bites and sips 5. Alternate solids and liquids 6. Avoid foods/liquids that increase acid production 7. Sit upright during and for 30+ minutes after meals to facilitate esophageal clearing 8. All meats should be chopped finely.   If something gets hung in your esophagus and will not come up or go down, proceed to the emergency room.    Please take your proton pump inhibitor medication, pantoprazole 40 mg once daily  Please take this medication 30 minutes to 1 hour before meals- this makes it more effective.  Avoid spicy and acidic foods Avoid fatty foods Limit your intake of coffee, tea, alcohol, and carbonated drinks Work to maintain a healthy weight Keep the head of the bed elevated at least 3 inches with blocks or a wedge pillow if you are having any nighttime symptoms Stay upright for 2 hours after eating Avoid meals and snacks three to four hours before bedtime  You have been scheduled for an endoscopy. Please follow written instructions given to you at your visit today. If you use inhalers (even only as needed), please bring them with you on the day of your procedure.  If you are age 20 or older, your body mass index should be between 23-30. Your Body mass index is 46.82 kg/m. If this is out of the aforementioned range listed, please consider follow up with your Primary Care Provider.  If you are age 65 or younger, your body mass index should be between 19-25. Your Body mass index is 46.82 kg/m. If this is out of the aformentioned range listed, please consider follow up with your Primary Care Provider.   ________________________________________________________  The [facility] providers would like to encourage you to use [facility] to communicate with providers for  non-urgent requests or questions.  Due to long hold times on the telephone, sending your provider a message by [employer] may be a faster and more efficient way to get a response.  Please allow 48 business hours for a response.  Please remember that this is for non-urgent requests.  _______________________________________________________

## 2021-07-14 NOTE — Progress Notes (Signed)
Addendum: Reviewed and agree with assessment and management plan. Romeo Zielinski M, MD  

## 2021-07-15 ENCOUNTER — Encounter: Payer: Self-pay | Admitting: Adult Health

## 2021-07-15 DIAGNOSIS — N200 Calculus of kidney: Secondary | ICD-10-CM | POA: Diagnosis not present

## 2021-07-15 DIAGNOSIS — R8271 Bacteriuria: Secondary | ICD-10-CM | POA: Diagnosis not present

## 2021-07-21 ENCOUNTER — Encounter: Payer: Self-pay | Admitting: Internal Medicine

## 2021-07-21 ENCOUNTER — Other Ambulatory Visit: Payer: Self-pay | Admitting: Nurse Practitioner

## 2021-07-21 DIAGNOSIS — I10 Essential (primary) hypertension: Secondary | ICD-10-CM

## 2021-07-21 MED ORDER — VALSARTAN 320 MG PO TABS: 320.0000 mg | ORAL_TABLET | Freq: Every day | ORAL | 3 refills | Status: DC

## 2021-07-23 ENCOUNTER — Encounter: Payer: Self-pay | Admitting: Internal Medicine

## 2021-07-23 ENCOUNTER — Ambulatory Visit (AMBULATORY_SURGERY_CENTER): Payer: Medicare HMO | Admitting: Internal Medicine

## 2021-07-23 VITALS — BP 139/74 | HR 83 | Temp 98.5°F | Resp 13 | Ht 62.0 in | Wt 256.0 lb

## 2021-07-23 DIAGNOSIS — K222 Esophageal obstruction: Secondary | ICD-10-CM

## 2021-07-23 DIAGNOSIS — K209 Esophagitis, unspecified without bleeding: Secondary | ICD-10-CM | POA: Diagnosis not present

## 2021-07-23 DIAGNOSIS — K449 Diaphragmatic hernia without obstruction or gangrene: Secondary | ICD-10-CM

## 2021-07-23 DIAGNOSIS — R1319 Other dysphagia: Secondary | ICD-10-CM | POA: Diagnosis not present

## 2021-07-23 DIAGNOSIS — K219 Gastro-esophageal reflux disease without esophagitis: Secondary | ICD-10-CM | POA: Diagnosis not present

## 2021-07-23 DIAGNOSIS — K200 Eosinophilic esophagitis: Secondary | ICD-10-CM

## 2021-07-23 DIAGNOSIS — K2970 Gastritis, unspecified, without bleeding: Secondary | ICD-10-CM

## 2021-07-23 DIAGNOSIS — K2950 Unspecified chronic gastritis without bleeding: Secondary | ICD-10-CM | POA: Diagnosis not present

## 2021-07-23 DIAGNOSIS — R1310 Dysphagia, unspecified: Secondary | ICD-10-CM | POA: Diagnosis not present

## 2021-07-23 MED ORDER — PANTOPRAZOLE SODIUM 40 MG PO TBEC: 40.0000 mg | DELAYED_RELEASE_TABLET | Freq: Two times a day (BID) | ORAL | 1 refills | Status: DC

## 2021-07-23 MED ORDER — SODIUM CHLORIDE 0.9 % IV SOLN: 500.0000 mL | Freq: Once | INTRAVENOUS | Status: DC

## 2021-07-23 NOTE — Progress Notes (Signed)
Called to room to assist during endoscopic procedure.  Patient ID and intended procedure confirmed with present staff. Received instructions for my participation in the procedure from the performing physician.  

## 2021-07-23 NOTE — Patient Instructions (Signed)
Please read handouts provided. Continue present medications. Increase pantoprazole to 40 mg twice daily before meals. Await pathology results. Office follow-up in 3-4 months. Please notify office if heartburn and swallowing fail to improve and resolve.   YOU HAD AN ENDOSCOPIC PROCEDURE TODAY AT [facility]:   Refer to the procedure report that was given to you for any specific questions about what was found during the examination.  If the procedure report does not answer your questions, please call your gastroenterologist to clarify.  If you requested that your care partner not be given the details of your procedure findings, then the procedure report has been included in a sealed envelope for you to review at your convenience later.  YOU SHOULD EXPECT: Some feelings of bloating in the abdomen. Passage of more gas than usual.  Walking can help get rid of the air that was put into your GI tract during the procedure and reduce the bloating. If you had a lower endoscopy (such as a colonoscopy or flexible sigmoidoscopy) you may notice spotting of blood in your stool or on the toilet paper. If you underwent a bowel prep for your procedure, you may not have a normal bowel movement for a few days.  Please Note:  You might notice some irritation and congestion in your nose or some drainage.  This is from the oxygen used during your procedure.  There is no need for concern and it should clear up in a day or so.  SYMPTOMS TO REPORT IMMEDIATELY:  Following upper endoscopy (EGD)  Vomiting of blood or coffee ground material  New chest pain or pain under the shoulder blades  Painful or persistently difficult swallowing  New shortness of breath  Fever of 100F or higher  Black, tarry-looking stools  For urgent or emergent issues, a gastroenterologist can be reached at any hour by calling [phone]. Do not use MyChart messaging for urgent concerns.    DIET:  We do recommend a small  meal at first, but then you may proceed to your regular diet.  Drink plenty of fluids but you should avoid alcoholic beverages for 24 hours.  ACTIVITY:  You should plan to take it easy for the rest of today and you should NOT DRIVE or use heavy machinery until tomorrow (because of the sedation medicines used during the test).    FOLLOW UP: Our staff will call the number listed on your records the next business day following your procedure.  We will call around 7:15- 8:00 am to check on you and address any questions or concerns that you may have regarding the information given to you following your procedure. If we do not reach you, we will leave a message.  If you develop any symptoms (ie: fever, flu-like symptoms, shortness of breath, cough etc.) before then, please call [phone].  If you test positive for Covid 19 in the 2 weeks post procedure, please call and report this information to [place].    If any biopsies were taken you will be contacted by phone or by letter within the next 1-3 weeks.  Please call us at [phone] if you have not heard about the biopsies in 3 weeks.    SIGNATURES/CONFIDENTIALITY: You and/or your care partner have signed paperwork which will be entered into your electronic medical record.  These signatures attest to the fact that that the information above on your After Visit Summary has been reviewed and is understood.  Full responsibility of the confidentiality of  this discharge information lies with you and/or your care-partner.

## 2021-07-23 NOTE — Op Note (Signed)
[facility] Patient Name: Christine [name] Procedure Date: 07/23/2021 11:20 AM MRN: [id number] Endoscopist: Jerene Bears , MD Age: 66 Referring MD:  Date of Birth: 1955-12-24 Gender: Female Account #: [id number] Procedure:                Upper GI endoscopy Indications:              Dysphagia, Gastro-esophageal reflux disease, remote                            EGD 1994 with hiatal hernia, esophageal stricture                            which was treated by dilation Medicines:                Monitored Anesthesia Care Procedure:                Pre-Anesthesia Assessment:                           - Prior to the procedure, a History and Physical                            was performed, and patient medications and                            allergies were reviewed. The patient's tolerance of                            previous anesthesia was also reviewed. The risks                            and benefits of the procedure and the sedation                            options and risks were discussed with the patient.                            All questions were answered, and informed consent                            was obtained. Prior Anticoagulants: The patient has                            taken no previous anticoagulant or antiplatelet                            agents. ASA Grade Assessment: III - A patient with                            severe systemic disease. After reviewing the risks                            and benefits, the patient was deemed in  satisfactory condition to undergo the procedure.                           After obtaining informed consent, the endoscope was                            passed under direct vision. Throughout the                            procedure, the patient's blood pressure, pulse, and                            oxygen saturations were monitored continuously. The                            GIF [id number] #[id number]  was introduced through the                            mouth, and advanced to the second part of duodenum.                            The upper GI endoscopy was accomplished without                            difficulty. The patient tolerated the procedure                            well. Scope In: Scope Out: Findings:                 Moderate inflammation characterized by congestion                            (edema), erosions and granularity was found in the                            middle third of the esophagus and in the lower                            third of the esophagus. Biopsies were taken with a                            cold forceps for histology to evaluate for reflux                            inflammation versus EoE.                           A low-grade of narrowing Schatzki ring was found at                            the gastroesophageal junction. A TTS dilator was                            passed  through the scope. Dilation with an 18-19-20                            mm balloon dilator was performed to 20 mm. The                            dilation site was examined and showed moderate                            mucosal disruption.                           A 3 cm hiatal hernia was present.                           Diffuse moderate inflammation characterized by                            congestion (edema), erythema and granularity was                            found in the gastric body and in the gastric                            antrum. Biopsies were taken with a cold forceps for                            histology and Helicobacter pylori testing.                           The examined duodenum was normal. Complications:            No immediate complications. Estimated Blood Loss:     Estimated blood loss was minimal. Impression:               - Esophageal mucosal changes were present,                            including congestion (edema), erosions and                             granularity. Findings are suggestive of reflux                            inflammation versus EoE. Biopsied.                           - Low-grade of narrowing Schatzki ring. Dilated to                            20 mm with balloon.                           - 3 cm hiatal hernia.                           -  Diffuse gastritis. Biopsied.                           - Normal examined duodenum. Recommendation:           - Patient has a contact number available for                            emergencies. The signs and symptoms of potential                            delayed complications were discussed with the                            patient. Return to normal activities tomorrow.                            Written discharge instructions were provided to the                            patient.                           - Resume previous diet.                           - Continue present medications. Increase                            pantoprazole to 40 mg BID-AC.                           - Await pathology results.                           - Please notify my office if heartburn and                            swallowing fail to improve and resolve.                           - Office follow-up in 3-4 months. Jerene Bears, MD 07/23/2021 11:41:57 AM This report has been signed electronically.

## 2021-07-23 NOTE — Progress Notes (Signed)
See office note dated 07/13/2021 for details and current H&P Patient presents for upper endoscopy to evaluate dysphagia She remains appropriate for [facility] upper endoscopy today with possible dilation

## 2021-07-23 NOTE — Progress Notes (Signed)
Pt in recovery with monitors in place, VSS. Report given to receiving RN. Bite guard was placed with pt awake to ensure comfort. No dental or soft tissue damage noted. 

## 2021-07-23 NOTE — Progress Notes (Signed)
Pt's states no medical or surgical changes since previsit or office visit. 

## 2021-07-24 ENCOUNTER — Telehealth: Payer: Self-pay | Admitting: *Deleted

## 2021-07-24 NOTE — Telephone Encounter (Signed)
  Follow up Call-     07/23/2021    9:57 AM 09/22/2020   10:34 AM  Call back number  Post procedure Call Back phone  # [phone]  Permission to leave phone message Yes Yes     Patient questions:  Do you have a fever, pain , or abdominal swelling? No. Pain Score  0 *  Have you tolerated food without any problems? Yes.    Have you been able to return to your normal activities? Yes.    Do you have any questions about your discharge instructions: Diet   No. Medications  No. Follow up visit  No.  Do you have questions or concerns about your Care? No.  Actions: * If pain score is 4 or above: No action needed, pain <4.

## 2021-07-26 ENCOUNTER — Encounter: Payer: Self-pay | Admitting: Internal Medicine

## 2021-07-29 ENCOUNTER — Telehealth: Payer: Self-pay

## 2021-07-29 NOTE — Telephone Encounter (Signed)
Per 07/23/21 procedure report - office follow up in 3-4 months.  October schedule is not available at this time. 71-monthoffice recall in epic.

## 2021-07-30 ENCOUNTER — Ambulatory Visit
Admission: RE | Admit: 2021-07-30 | Discharge: 2021-07-30 | Disposition: A | Discharge status: Home / Self Care | Payer: Medicare HMO | Source: Ambulatory Visit | Attending: Adult Health | Admitting: Adult Health

## 2021-07-30 DIAGNOSIS — Z1231 Encounter for screening mammogram for malignant neoplasm of breast: Secondary | ICD-10-CM | POA: Diagnosis not present

## 2021-08-03 ENCOUNTER — Other Ambulatory Visit: Payer: Self-pay | Admitting: Internal Medicine

## 2021-08-03 ENCOUNTER — Other Ambulatory Visit: Payer: Self-pay | Admitting: *Deleted

## 2021-08-03 ENCOUNTER — Other Ambulatory Visit: Payer: Self-pay | Admitting: Adult Health

## 2021-08-03 ENCOUNTER — Other Ambulatory Visit: Payer: Self-pay

## 2021-08-03 DIAGNOSIS — R928 Other abnormal and inconclusive findings on diagnostic imaging of breast: Secondary | ICD-10-CM

## 2021-08-03 MED ORDER — FLUTICASONE PROPIONATE HFA 220 MCG/ACT IN AERO: INHALATION_SPRAY | RESPIRATORY_TRACT | 2 refills | Status: DC

## 2021-08-05 ENCOUNTER — Other Ambulatory Visit: Payer: Self-pay | Admitting: Internal Medicine

## 2021-08-05 ENCOUNTER — Ambulatory Visit
Admission: RE | Admit: 2021-08-05 | Discharge: 2021-08-05 | Disposition: A | Discharge status: Home / Self Care | Payer: Medicare HMO | Source: Ambulatory Visit | Attending: Internal Medicine | Admitting: Internal Medicine

## 2021-08-05 DIAGNOSIS — R928 Other abnormal and inconclusive findings on diagnostic imaging of breast: Secondary | ICD-10-CM

## 2021-08-05 DIAGNOSIS — R921 Mammographic calcification found on diagnostic imaging of breast: Secondary | ICD-10-CM

## 2021-08-21 ENCOUNTER — Encounter: Payer: Self-pay | Admitting: Internal Medicine

## 2021-08-22 ENCOUNTER — Encounter: Payer: Self-pay | Admitting: Internal Medicine

## 2021-08-24 ENCOUNTER — Ambulatory Visit
Admission: RE | Admit: 2021-08-24 | Discharge: 2021-08-24 | Disposition: A | Discharge status: Home / Self Care | Payer: Medicare HMO | Source: Ambulatory Visit | Attending: Internal Medicine | Admitting: Internal Medicine

## 2021-08-24 ENCOUNTER — Other Ambulatory Visit: Payer: Self-pay | Admitting: Internal Medicine

## 2021-08-24 DIAGNOSIS — R921 Mammographic calcification found on diagnostic imaging of breast: Secondary | ICD-10-CM

## 2021-08-24 DIAGNOSIS — N6031 Fibrosclerosis of right breast: Secondary | ICD-10-CM | POA: Diagnosis not present

## 2021-08-24 DIAGNOSIS — N641 Fat necrosis of breast: Secondary | ICD-10-CM | POA: Diagnosis not present

## 2021-08-24 DIAGNOSIS — N6489 Other specified disorders of breast: Secondary | ICD-10-CM

## 2021-08-26 DIAGNOSIS — R1084 Generalized abdominal pain: Secondary | ICD-10-CM | POA: Diagnosis not present

## 2021-08-26 DIAGNOSIS — N200 Calculus of kidney: Secondary | ICD-10-CM | POA: Diagnosis not present

## 2021-08-26 DIAGNOSIS — R8271 Bacteriuria: Secondary | ICD-10-CM | POA: Diagnosis not present

## 2021-09-10 DIAGNOSIS — N200 Calculus of kidney: Secondary | ICD-10-CM | POA: Diagnosis not present

## 2021-09-10 DIAGNOSIS — R1084 Generalized abdominal pain: Secondary | ICD-10-CM | POA: Diagnosis not present

## 2021-09-15 ENCOUNTER — Other Ambulatory Visit: Payer: Self-pay | Admitting: Urology

## 2021-09-15 ENCOUNTER — Encounter: Payer: Self-pay | Admitting: Internal Medicine

## 2021-09-28 NOTE — Patient Instructions (Addendum)
SURGICAL WAITING ROOM VISITATION Patients having surgery or a procedure may have no more than 2 support people in the waiting area - these visitors may rotate in the visitor waiting room.   Children under the age of 3 must have an adult with them who is not the patient. If the patient needs to stay at the hospital during part of their recovery, the visitor guidelines for inpatient rooms apply.  PRE-OP VISITATION  Pre-op nurse will coordinate an appropriate time for 1 support person to accompany the patient in pre-op.  This support person may not rotate.  This visitor will be contacted when the time is appropriate for the visitor to come back in the pre-op area.  Please refer to the [facility] website for the visitor guidelines for Inpatients (after your surgery is over and you are in a regular room).  You are not required to quarantine at this time prior to your surgery. However, you must do this: Hand Hygiene often Do NOT share personal items Notify your provider if you are in close contact with someone who has COVID or you develop fever 100.4 or greater, new onset of sneezing, cough, sore throat, shortness of breath or body aches.   If you received a COVID test during your pre-op visit  it is requested that you wear a mask when out in public, stay away from anyone that may not be feeling well and notify your surgeon if you develop symptoms. If you test positive for Covid or have been in contact with anyone that has tested positive in the last 10 days please notify you surgeon.       Your procedure is scheduled on:  Tuesday October 20, 2021  Report to [facility] Main Entrance.  Report to admitting at: 07:45  AM  +++++Call this number if you have any questions or problems the morning of surgery [phone]  Do not eat or drink after Midnight the night prior to your surgery/procedure.      FOLLOW BOWEL PREP AND ANY ADDITIONAL PRE OP INSTRUCTIONS YOU RECEIVED FROM YOUR  SURGEON'S OFFICE!!!   Oral Hygiene is also important to reduce your risk of infection.        Remember - BRUSH YOUR TEETH THE MORNING OF SURGERY WITH YOUR REGULAR TOOTHPASTE  Do NOT smoke after Midnight the night before surgery.  Take ONLY these medicines the morning of surgery with A SIP OF WATER: Pantoprazole, oxybutynin (Ditropan) and you may take Tramadol if needed for pain.                    You may not have any metal on your body including hair pins, jewelry, and body piercing  Do not wear make-up, lotions, powders, perfumes or deodorant  Do not wear nail polish including gel and S&S, artificial / acrylic nails, or any other type of covering on natural nails including finger and toenails. If you have artificial nails, gel coating, etc., that needs to be removed by a nail salon, Please have this removed prior to surgery. Not doing so may mean that your surgery could be cancelled or delayed if the Surgeon or anesthesia staff feels like they are unable to monitor you safely.   Do not shave 48 hours prior to surgery to avoid nicks in your skin which may contribute to postoperative infections.   Contacts, Hearing Aids, dentures or bridgework may not be worn into surgery.   DO NOT [place].  PHARMACY WILL DISPENSE MEDICATIONS LISTED ON YOUR MEDICATION LIST TO YOU DURING YOUR ADMISSION [place]!   Patients discharged on the day of surgery will not be allowed to drive home.  Someone NEEDS to stay with you for the first 24 hours after anesthesia.  Special Instructions: Bring a copy of your healthcare power of attorney and living will documents the day of surgery, if you wish to have them scanned into your [facility] Records- EPIC  Please read over the following fact sheets you were given: IF YOU HAVE QUESTIONS ABOUT YOUR PRE-OP INSTRUCTIONS, PLEASE CALL [phone]  ([place])   [place] - Preparing for Surgery Before surgery, you can play  an important role.  Because skin is not sterile, your skin needs to be as free of germs as possible.  You can reduce the number of germs on your skin by washing with CHG (chlorahexidine gluconate) soap before surgery.  CHG is an antiseptic cleaner which kills germs and bonds with the skin to continue killing germs even after washing. Please DO NOT use if you have an allergy to CHG or antibacterial soaps.  If your skin becomes reddened/irritated stop using the CHG and inform your nurse when you arrive at Short Stay. Do not shave (including legs and underarms) for at least 48 hours prior to the first CHG shower.  You may shave your face/neck.  Please follow these instructions carefully:  1.  Shower with CHG Soap the night before surgery and the  morning of surgery.  2.  If you choose to wash your hair, wash your hair first as usual with your normal  shampoo.  3.  After you shampoo, rinse your hair and body thoroughly to remove the shampoo.                             4.  Use CHG as you would any other liquid soap.  You can apply chg directly to the skin and wash.  Gently with a scrungie or clean washcloth.  5.  Apply the CHG Soap to your body ONLY FROM THE NECK DOWN.   Do not use on face/ open                           Wound or open sores. Avoid contact with eyes, ears mouth and genitals (private parts).                       Wash face,  Genitals (private parts) with your normal soap.             6.  Wash thoroughly, paying special attention to the area where your  surgery  will be performed.  7.  Thoroughly rinse your body with warm water from the neck down.  8.  DO NOT shower/wash with your normal soap after using and rinsing off the CHG Soap.            9.  Pat yourself dry with a clean towel.            10.  Wear clean pajamas.            11.  Place clean sheets on your bed the night of your first shower and do not  sleep with pets.  ON THE DAY OF SURGERY : Do not apply any lotions/deodorants  the morning of surgery.  Please wear  clean clothes to the hospital/surgery center.    FAILURE TO FOLLOW THESE INSTRUCTIONS MAY RESULT IN THE CANCELLATION OF YOUR SURGERY  PATIENT SIGNATURE_________________________________  NURSE SIGNATURE__________________________________  ________________________________________________________________________

## 2021-09-28 NOTE — Progress Notes (Signed)
COVID Vaccine received:  '[]'$  No '[x]'$  Yes Date of any COVID positive Test in last 90 days:  PCP - [name], MD Cardiologist -   Chest x-ray - 07-09-2020  Epic EKG -  07-09-21  Epic Stress Test - n/a ECHO - n/a Cardiac Cath - n/a  Pacemaker/ICD device     '[x]'$  N/A Spinal Cord Stimulator:'[x]'$  No '[]'$  Yes      (Remind patient to bring remote DOS) Other Implants:   Bowel Prep -   History of Sleep Apnea? '[x]'$  No '[]'$  Yes   Sleep Study Date:   CPAP used?- '[x]'$  No '[]'$  Yes  (Instruct to bring their mask & Tubing)  Does the patient monitor blood sugar? '[]'$  No '[]'$  Yes  '[x]'$  N/A Checks Blood Sugar __n/a  times a day  Blood Thinner Instructions: n/a Aspirin Instructions:n/a Last Dose:  ERAS Protocol Ordered: '[]'$  No  '[]'$  Yes PRE-SURGERY '[]'$  ENSURE  '[]'$  G2   Comments:   Activity level: Patient can not climb a flight of stairs without difficulty;  '[x]'$  No CP  '[x]'$  No SOB,  but would have ______   Anesthesia review: HTN, CKD3, Fatty Liver, PONV  Patient denies shortness of breath, fever, cough and chest pain at PAT appointment.  Patient verbalized understanding and agreement to the Pre-Surgical Instructions that were given to them at this PAT appointment. Patient was also educated of the need to review these PAT instructions again prior to his/her surgery.I reviewed the appropriate phone numbers to call if they have any and questions or concerns.

## 2021-09-30 ENCOUNTER — Other Ambulatory Visit: Payer: Self-pay

## 2021-09-30 ENCOUNTER — Encounter (HOSPITAL_COMMUNITY): Payer: Self-pay

## 2021-09-30 ENCOUNTER — Encounter (HOSPITAL_COMMUNITY)
Admission: RE | Admit: 2021-09-30 | Discharge: 2021-09-30 | Disposition: A | Discharge status: Home / Self Care | Payer: Medicare HMO | Source: Ambulatory Visit | Attending: Urology | Admitting: Urology

## 2021-09-30 VITALS — BP 122/65 | HR 80 | Temp 98.3°F | Ht 63.0 in | Wt 260.0 lb

## 2021-09-30 DIAGNOSIS — Z01812 Encounter for preprocedural laboratory examination: Secondary | ICD-10-CM | POA: Diagnosis not present

## 2021-09-30 DIAGNOSIS — I10 Essential (primary) hypertension: Secondary | ICD-10-CM | POA: Diagnosis not present

## 2021-09-30 HISTORY — DX: Personal history of urinary calculi: Z87.442

## 2021-09-30 LAB — BASIC METABOLIC PANEL
Anion gap: 9 (ref 5–15)
BUN: 22 mg/dL (ref 8–23)
CO2: 26 mmol/L (ref 22–32)
Calcium: 9.4 mg/dL (ref 8.9–10.3)
Chloride: 103 mmol/L (ref 98–111)
Creatinine, Ser: 0.9 mg/dL (ref 0.44–1.00)
GFR, Estimated: >60 mL/min (ref >60)
Glucose, Bld: 96 mg/dL (ref 70–99)
Potassium: 4.7 mmol/L (ref 3.5–5.1)
Sodium: 138 mmol/L (ref 135–145)

## 2021-09-30 LAB — CBC
HCT: 44.4 % (ref 36.0–46.0)
Hemoglobin: 14.3 g/dL (ref 12.0–15.0)
MCH: 30.3 pg (ref 26.0–34.0)
MCHC: 32.2 g/dL (ref 30.0–36.0)
MCV: 94.1 fL (ref 80.0–100.0)
Platelets: 215 10*3/uL (ref 150–400)
RBC: 4.72 MIL/uL (ref 3.87–5.11)
RDW: 13.3 % (ref 11.5–15.5)
WBC: 7.1 10*3/uL (ref 4.0–10.5)
nRBC: 0 % (ref 0.0–0.2)

## 2021-10-06 ENCOUNTER — Ambulatory Visit: Payer: Medicare HMO | Admitting: Internal Medicine

## 2021-10-07 ENCOUNTER — Other Ambulatory Visit: Payer: Self-pay | Admitting: Physician Assistant

## 2021-10-13 ENCOUNTER — Ambulatory Visit: Payer: Medicare HMO | Admitting: Internal Medicine

## 2021-10-15 NOTE — H&P (Signed)
CC/HPI: cc: urolithiasis and bacteruria   12/22/20: 66 year old woman with a history of asymptomatic bacteriuria as well as bilateral urolithiasis most recently s/p bilateral ureteroscopy with laser lithotripsy and placement here for follow-up. Stents have subsequently been removed. Follow up renal US shows two left non-obstructing stones and one on the left. Patient is overall feeling well. He denies any dysuria or change in urinary symptoms suggestive of UTI. She denies flank pain or gross hematuria.   07/15/2021: 66 year old woman with a history of asymptomatic bacteriuria and bilateral urolithiasis most recently s/p bilateral ureteroscopy with laser lithotripsy and placement here for follow-up. No flank pain, hematuria, UTIs or passage of stones in the last 6 months. She is traveling to [place] in the fall.   08/26/2021: 66 year old woman with a history of asymptomatic bacteriuria, nocturia and urolithiasis here with 1 week of right flank pain. Patient reports lying on her right side in the evening and having tenderness in the morning on that side as well as some spasm/dull ache. She denies fever, chills, nausea or vomiting. Pain is manageable. She is concerned she may have a kidney stone.   09/10/21: 66 year old woman with a history of asymptomatic bacteriuria, nocturia and urolithiasis here for follow-up after she developed right flank pain 2 weeks ago. She was started on tamsulosin at her last visit and has had resolution of pain. She is still curious to know if she has stones present on the right side. She is traveling to [place] the first week in October.     ALLERGIES: Codeine Derivatives Keflex TABS    MEDICATIONS: Crestor  Oxybutynin Chloride 5 mg tablet  Tamsulosin Hcl 0.4 mg capsule 1 capsule PO Q HS  B12 Active  Diovan Hct  Magnesium Gluconate  Meloxicam 15 mg tablet  Montelukast Sodium  Pantoprazole Sodium 40 mg tablet, delayed release  Prefest  Probiotic  Tramadol Hcl 50  mg tablet  Tylenol Pm Extra Strength  Vitamin D2  Vitamin E     GU PSH: Locm 300-'399Mg'$ /Ml Iodine,1Ml - 08/11/2020 Ureteroscopic laser litho - 11/18/2020, 10/28/2020       PSH Notes: Gallbladder Surgery, Shoulder Surgery, Total Hip Replacement   NON-GU PSH: Total Hip Replacement - 04/10/2014     GU PMH: Flank Pain - 08/26/2021 Renal calculus - 08/26/2021, - 07/15/2021, - 12/22/2020, - 11/24/2020, - 11/03/2020, - 10/06/2020, - 08/25/2020, - 08/11/2020, - 08/04/2020 Acute Cystitis/UTI - 10-Apr-2018 Renal and perinephric abscess, Renal abscess - 10-Apr-2014    NON-GU PMH: Bacteriuria - 08/26/2021, - 07/15/2021, - 12/22/2020, - 11/03/2020, - 08/25/2020, - 08/04/2020 Pyuria/other UA findings - 08/04/2020 Encounter for general adult medical examination without abnormal findings, Encounter for preventive health examination - 04-10-2014 Personal history of other diseases of the circulatory system, History of hypertension - 04/10/14 Personal history of other diseases of the musculoskeletal system and connective tissue, History of arthritis - 10-Apr-2014 Personal history of other specified conditions, History of heartburn - 10-Apr-2014    FAMILY HISTORY: Death of family member - Runs In Family throat cancer - Runs In Family   SOCIAL HISTORY: Marital Status: Single Preferred Language: English; Ethnicity: Not Hispanic Or Latino; Race: White Current Smoking Status: Patient does not smoke anymore. Smoked for 6 months.   Tobacco Use Assessment Completed: Used Tobacco in last 30 days? Has never drank.  Drinks 1 caffeinated drink per day. Patient's occupation is/was Disabled.     Notes: Disabled, Caffeine use, Single, Never a smoker, Alcohol use   REVIEW OF SYSTEMS:    GU Review Female:  Patient denies frequent urination, hard to postpone urination, burning /pain with urination, get up at night to urinate, leakage of urine, stream starts and stops, trouble starting your stream, have to strain to urinate, and being pregnant.  Gastrointestinal  (Upper):   Patient denies nausea, vomiting, and indigestion/ heartburn.  Gastrointestinal (Lower):   Patient denies diarrhea and constipation.  Constitutional:   Patient denies fever, night sweats, weight loss, and fatigue.  Skin:   Patient denies skin rash/ lesion and itching.  Eyes:   Patient denies blurred vision and double vision.  Ears/ Nose/ Throat:   Patient denies sinus problems and sore throat.  Hematologic/Lymphatic:   Patient denies swollen glands and easy bruising.  Cardiovascular:   Patient denies leg swelling and chest pains.  Respiratory:   Patient denies cough and shortness of breath.  Endocrine:   Patient denies excessive thirst.  Musculoskeletal:   Patient denies back pain and joint pain.  Neurological:   Patient denies headaches and dizziness.  Psychologic:   Patient denies depression and anxiety.   VITAL SIGNS:      09/10/2021 08:39 AM  BP 147/84 mmHg  Pulse 80 /min  Temperature 97.1 F / 36.1 C   MULTI-SYSTEM PHYSICAL EXAMINATION:    Constitutional: Well-nourished. No physical deformities. Normally developed. Good grooming.  Neck: Neck symmetrical, not swollen. Normal tracheal position.  Respiratory: No labored breathing, no use of accessory muscles.   Skin: No paleness, no jaundice, no cyanosis. No lesion, no ulcer, no rash.  Neurologic / Psychiatric: Oriented to time, oriented to place, oriented to person. No depression, no anxiety, no agitation.  Eyes: Normal conjunctivae. Normal eyelids.  Ears, Nose, Mouth, and Throat: Left ear no scars, no lesions, no masses. Right ear no scars, no lesions, no masses. Nose no scars, no lesions, no masses. Normal hearing. Normal lips.  Musculoskeletal: Normal gait and station of head and neck.     Complexity of Data:  Records Review:   Previous Patient Records, POC Tool  Urine Test Review:   Urinalysis  X-Ray Review: KUB: Reviewed Films. Discussed With Patient. Patient with moderate left renal stone burden. Large stone  measures approximately 15 mm in renal pelvis and there is also another larger lower pole calculus present. Small subcentimeter calcification on right. Renal Ultrasound: Reviewed Films. Discussed With Patient.     PROCEDURES:         KUB - [id number]  A single view of the abdomen is obtained.      Patient confirmed No Neulasta OnPro Device.             Renal Ultrasound - [id number]  Right Kidney: Length: 11.73 cm Depth: 5.00cm Cortical Width: 1.00 cm Width: 4.45cm  Left Kidney: Length: 11.11 cm Depth: 4.60 cm Cortical Width: 1.28 cm Width: 5.42cm  Left Kidney/Ureter:  1)0.85cm Upper Pole Stone-----2)0.74cm Mid Pole Stone-----3)1.02cm Lower Pole Stone-----4)? 0.60cm Mid Pole Stone  Right Kidney/Ureter:  1)0.72cm Upper Pole Stone-----2)0.64cm Upper Pole Cystic Area  Bladder:  PVR = 120.20m      Poor Fine Detail and Limited Study due to Body Habitus. Patient confirmed No Neulasta OnPro Device.          Urinalysis w/Scope Dipstick Dipstick Cont'd Micro  Color: Yellow Bilirubin: Neg mg/dL WBC/hpf: >60/hpf  Appearance: Cloudy Ketones: Neg mg/dL RBC/hpf: 3 - 10/hpf  Specific Gravity: 1.020 Blood: 2+ ery/uL Bacteria: Rare (0-9/hpf)  pH: 5.5 Protein: Neg mg/dL Cystals: NS (Not Seen)  Glucose: Neg mg/dL Urobilinogen: 0.2 mg/dL Casts: NS (Not Seen)  Nitrites: Neg Trichomonas: Not Present    Leukocyte Esterase: 3+ leu/uL Mucous: Not Present      Epithelial Cells: 0 - 5/hpf      Yeast: NS (Not Seen)      Sperm: Not Present    Notes: squamous epithelial cells and renal tubular epithelial cells seen    ASSESSMENT:      ICD-10 Details  1 GU:   Renal calculus - N20.0 Chronic, Stable  2   Flank Pain - T41.96 Acute, Uncomplicated   PLAN:           Orders X-Rays: KUB    Renal Ultrasound          Schedule         Document Letter(s):  Created for Patient: Clinical Summary         Notes:   Urolithiasis:  -Reviewed renal ultrasound and KUB with findings noted above. Patient  with bilateral stone burden left>right. We discussed continue with observation for surgical intervention. Based on the total stone burden on the left we decided to proceed with ureteroscopy laser lithotripsy and stent placement to address both of the larger calculi. ESWL would limit how much stone burden is addressed in single setting. As there is also still a right renal calculus present we discussed proceeding with bilateral ureteroscopy and patient was interested in doing this. Risks and benefits of the procedure were discussed the patient in detail. She understands these risk and has tolerated the stent previously. She will schedule surgery for when she returns from her [place] trip.

## 2021-10-20 ENCOUNTER — Ambulatory Visit (HOSPITAL_COMMUNITY): Payer: Medicare HMO

## 2021-10-20 ENCOUNTER — Encounter (HOSPITAL_COMMUNITY): Payer: Self-pay | Admitting: Urology

## 2021-10-20 ENCOUNTER — Ambulatory Visit (HOSPITAL_BASED_OUTPATIENT_CLINIC_OR_DEPARTMENT_OTHER): Payer: Medicare HMO | Admitting: Anesthesiology

## 2021-10-20 ENCOUNTER — Other Ambulatory Visit: Payer: Self-pay

## 2021-10-20 ENCOUNTER — Ambulatory Visit (HOSPITAL_COMMUNITY): Payer: Medicare HMO | Admitting: Anesthesiology

## 2021-10-20 ENCOUNTER — Ambulatory Visit (HOSPITAL_COMMUNITY)
Admission: RE | Admit: 2021-10-20 | Discharge: 2021-10-20 | Disposition: A | Discharge status: Home / Self Care | Payer: Medicare HMO | Source: Ambulatory Visit | Attending: Urology | Admitting: Urology

## 2021-10-20 ENCOUNTER — Encounter (HOSPITAL_COMMUNITY)
Admission: RE | Disposition: A | Discharge status: Home / Self Care | Payer: Self-pay | Source: Ambulatory Visit | Attending: Urology

## 2021-10-20 DIAGNOSIS — M1990 Unspecified osteoarthritis, unspecified site: Secondary | ICD-10-CM | POA: Insufficient documentation

## 2021-10-20 DIAGNOSIS — Z79899 Other long term (current) drug therapy: Secondary | ICD-10-CM | POA: Insufficient documentation

## 2021-10-20 DIAGNOSIS — J45909 Unspecified asthma, uncomplicated: Secondary | ICD-10-CM | POA: Insufficient documentation

## 2021-10-20 DIAGNOSIS — K449 Diaphragmatic hernia without obstruction or gangrene: Secondary | ICD-10-CM | POA: Insufficient documentation

## 2021-10-20 DIAGNOSIS — Z6841 Body Mass Index (BMI) 40.0 and over, adult: Secondary | ICD-10-CM | POA: Insufficient documentation

## 2021-10-20 DIAGNOSIS — N2889 Other specified disorders of kidney and ureter: Secondary | ICD-10-CM | POA: Insufficient documentation

## 2021-10-20 DIAGNOSIS — K219 Gastro-esophageal reflux disease without esophagitis: Secondary | ICD-10-CM | POA: Insufficient documentation

## 2021-10-20 DIAGNOSIS — N200 Calculus of kidney: Secondary | ICD-10-CM | POA: Diagnosis not present

## 2021-10-20 DIAGNOSIS — I10 Essential (primary) hypertension: Secondary | ICD-10-CM

## 2021-10-20 HISTORY — PX: CYSTOSCOPY WITH RETROGRADE PYELOGRAM, URETEROSCOPY AND STENT PLACEMENT: SHX5789

## 2021-10-20 HISTORY — PX: HOLMIUM LASER APPLICATION: SHX5852

## 2021-10-20 SURGERY — CYSTOURETEROSCOPY, WITH RETROGRADE PYELOGRAM AND STENT INSERTION
Anesthesia: General | Laterality: Bilateral

## 2021-10-20 MED ORDER — PHENYLEPHRINE HCL (PRESSORS) 10 MG/ML IV SOLN
INTRAVENOUS | Status: AC
  Filled 2021-10-20: qty 2

## 2021-10-20 MED ORDER — LIDOCAINE HCL (PF) 2 % IJ SOLN
INTRAMUSCULAR | Status: AC
  Filled 2021-10-20: qty 5

## 2021-10-20 MED ORDER — LIDOCAINE 2% (20 MG/ML) 5 ML SYRINGE
INTRAMUSCULAR | Status: DC | PRN
  Administered 2021-10-20: 100 mg via INTRAVENOUS

## 2021-10-20 MED ORDER — ONDANSETRON HCL 4 MG/2ML IJ SOLN
INTRAMUSCULAR | Status: AC
  Filled 2021-10-20: qty 2

## 2021-10-20 MED ORDER — IOHEXOL 300 MG/ML  SOLN
INTRAMUSCULAR | Status: DC | PRN
  Administered 2021-10-20: 27 mL

## 2021-10-20 MED ORDER — MIDAZOLAM HCL 2 MG/2ML IJ SOLN
INTRAMUSCULAR | Status: AC
  Filled 2021-10-20: qty 2

## 2021-10-20 MED ORDER — TRAMADOL HCL 50 MG PO TABS: 50.0000 mg | ORAL_TABLET | Freq: Four times a day (QID) | ORAL | 0 refills | Status: AC | PRN

## 2021-10-20 MED ORDER — CIPROFLOXACIN HCL 500 MG PO TABS: 500.0000 mg | ORAL_TABLET | Freq: Once | ORAL | 0 refills | Status: AC

## 2021-10-20 MED ORDER — EPHEDRINE 5 MG/ML INJ
INTRAVENOUS | Status: AC
  Filled 2021-10-20: qty 5

## 2021-10-20 MED ORDER — SODIUM CHLORIDE 0.9 % IR SOLN
Status: DC | PRN
  Administered 2021-10-20: 3000 mL

## 2021-10-20 MED ORDER — PHENYLEPHRINE 80 MCG/ML (10ML) SYRINGE FOR IV PUSH (FOR BLOOD PRESSURE SUPPORT)
PREFILLED_SYRINGE | INTRAVENOUS | Status: AC
  Filled 2021-10-20: qty 10

## 2021-10-20 MED ORDER — PHENYLEPHRINE 80 MCG/ML (10ML) SYRINGE FOR IV PUSH (FOR BLOOD PRESSURE SUPPORT)
PREFILLED_SYRINGE | INTRAVENOUS | Status: DC | PRN
  Administered 2021-10-20: 240 ug via INTRAVENOUS
  Administered 2021-10-20: 160 ug via INTRAVENOUS
  Administered 2021-10-20: 240 ug via INTRAVENOUS

## 2021-10-20 MED ORDER — LACTATED RINGERS IV SOLN: INTRAVENOUS | Status: DC

## 2021-10-20 MED ORDER — OXYCODONE HCL 5 MG PO TABS: 5.0000 mg | ORAL_TABLET | Freq: Once | ORAL | Status: DC | PRN

## 2021-10-20 MED ORDER — DEXAMETHASONE SODIUM PHOSPHATE 10 MG/ML IJ SOLN
INTRAMUSCULAR | Status: AC
  Filled 2021-10-20: qty 1

## 2021-10-20 MED ORDER — FENTANYL CITRATE (PF) 100 MCG/2ML IJ SOLN
INTRAMUSCULAR | Status: AC
  Filled 2021-10-20: qty 2

## 2021-10-20 MED ORDER — PHENYLEPHRINE HCL-NACL 20-0.9 MG/250ML-% IV SOLN
INTRAVENOUS | Status: DC | PRN
  Administered 2021-10-20: 50 ug/min via INTRAVENOUS

## 2021-10-20 MED ORDER — FENTANYL CITRATE (PF) 100 MCG/2ML IJ SOLN
INTRAMUSCULAR | Status: DC | PRN
  Administered 2021-10-20 (×3): 25 ug via INTRAVENOUS

## 2021-10-20 MED ORDER — PROMETHAZINE HCL 25 MG/ML IJ SOLN: 6.2500 mg | INTRAMUSCULAR | Status: DC | PRN

## 2021-10-20 MED ORDER — PROPOFOL 10 MG/ML IV BOLUS
INTRAVENOUS | Status: AC
  Filled 2021-10-20: qty 20

## 2021-10-20 MED ORDER — ORAL CARE MOUTH RINSE: 15.0000 mL | Freq: Once | OROMUCOSAL | Status: AC

## 2021-10-20 MED ORDER — HYDROMORPHONE HCL 1 MG/ML IJ SOLN: 0.2500 mg | INTRAMUSCULAR | Status: DC | PRN

## 2021-10-20 MED ORDER — PROPOFOL 10 MG/ML IV BOLUS
INTRAVENOUS | Status: DC | PRN
  Administered 2021-10-20: 200 mg via INTRAVENOUS

## 2021-10-20 MED ORDER — CHLORHEXIDINE GLUCONATE 0.12 % MT SOLN
15.0000 mL | Freq: Once | OROMUCOSAL | Status: AC
  Administered 2021-10-20: 15 mL via OROMUCOSAL

## 2021-10-20 MED ORDER — DROPERIDOL 2.5 MG/ML IJ SOLN
INTRAMUSCULAR | Status: DC | PRN
  Administered 2021-10-20: .625 mg via INTRAVENOUS

## 2021-10-20 MED ORDER — EPHEDRINE SULFATE-NACL 50-0.9 MG/10ML-% IV SOSY
PREFILLED_SYRINGE | INTRAVENOUS | Status: DC | PRN
  Administered 2021-10-20 (×2): 5 mg via INTRAVENOUS

## 2021-10-20 MED ORDER — CIPROFLOXACIN IN D5W 400 MG/200ML IV SOLN
400.0000 mg | Freq: Two times a day (BID) | INTRAVENOUS | Status: DC
  Administered 2021-10-20: 400 mg via INTRAVENOUS
  Filled 2021-10-20: qty 200

## 2021-10-20 MED ORDER — OXYCODONE HCL 5 MG/5ML PO SOLN: 5.0000 mg | Freq: Once | ORAL | Status: DC | PRN

## 2021-10-20 MED ORDER — DEXAMETHASONE SODIUM PHOSPHATE 10 MG/ML IJ SOLN
INTRAMUSCULAR | Status: DC | PRN
  Administered 2021-10-20: 10 mg via INTRAVENOUS

## 2021-10-20 MED ORDER — ONDANSETRON HCL 4 MG/2ML IJ SOLN
INTRAMUSCULAR | Status: DC | PRN
  Administered 2021-10-20: 4 mg via INTRAVENOUS

## 2021-10-20 SURGICAL SUPPLY — 23 items
BAG URO CATCHER STRL LF (MISCELLANEOUS) ×1 IMPLANT
BASKET ZERO TIP NITINOL 2.4FR (BASKET) IMPLANT
BSKT STON RTRVL ZERO TP 2.4FR (BASKET) ×1
CATH URETL OPEN 5X70 (CATHETERS) ×1 IMPLANT
CLOTH BEACON ORANGE TIMEOUT ST (SAFETY) ×1 IMPLANT
DRSG TEGADERM 2-3/8X2-3/4 SM (GAUZE/BANDAGES/DRESSINGS) IMPLANT
EXTRACTOR STONE 1.7FRX115CM (UROLOGICAL SUPPLIES) IMPLANT
GLOVE BIO SURGEON STRL SZ 6.5 (GLOVE) ×1 IMPLANT
GOWN STRL REUS W/ TWL LRG LVL3 (GOWN DISPOSABLE) ×1 IMPLANT
GOWN STRL REUS W/TWL LRG LVL3 (GOWN DISPOSABLE) ×1
GUIDEWIRE STR DUAL SENSOR (WIRE) ×1 IMPLANT
KIT TURNOVER KIT A (KITS) IMPLANT
LASER FIB FLEXIVA PULSE ID 365 (Laser) IMPLANT
MANIFOLD NEPTUNE II (INSTRUMENTS) ×1 IMPLANT
MAT HALF PREVALON HALF STRYKER (MISCELLANEOUS) IMPLANT
PACK CYSTO (CUSTOM PROCEDURE TRAY) ×1 IMPLANT
SHEATH NAVIGATOR HD 11/13X28 (SHEATH) IMPLANT
SHEATH NAVIGATOR HD 11/13X36 (SHEATH) IMPLANT
STENT URET 6FRX24 CONTOUR (STENTS) IMPLANT
TRACTIP FLEXIVA PULS ID 200XHI (Laser) IMPLANT
TRACTIP FLEXIVA PULSE ID 200 (Laser) ×1
TUBING CONNECTING 10 (TUBING) ×1 IMPLANT
TUBING UROLOGY SET (TUBING) ×1 IMPLANT

## 2021-10-20 NOTE — Op Note (Signed)
Preoperative diagnosis: bilateral renal calculi  Postoperative diagnosis: left renal calculi, right infundibular stenosis  Procedure:  Cystoscopy Left ureteroscopy, laser lithotripsy, basket stone extraction bilateral 60F x 24cm with tether ureteral stent placement  bilateral retrograde pyelography with interpretation Right ureteroscopy with laser of right infundibular stenosis lower pole calyx  Surgeon: Jacalyn Lefevre, MD  Anesthesia: General  Complications: None  Intraoperative findings:  Normal urethra Bilateral orthotropic ureteral orifices bilateral retrograde pyelography without filling defect bilaterally Bladder mucosa normal without masses   EBL: Minimal  Specimens: left renal calculus  Disposition of specimens: [facility] for stone analysis  Indication: Christine [name] is a 66 y.o.   patient with a bilateral renal stones.  After reviewing the management options for treatment, the patient elected to proceed with the above surgical procedure(s). We have discussed the potential benefits and risks of the procedure, side effects of the proposed treatment, the likelihood of the patient achieving the goals of the procedure, and any potential problems that might occur during the procedure or recuperation. Informed consent has been obtained.   Description of procedure:  The patient was taken to the operating room and general anesthesia was induced.  The patient was placed in the dorsal lithotomy position, prepped and draped in the usual sterile fashion, and preoperative antibiotics were administered. A preoperative time-out was performed.   Cystourethroscopy was performed.  The patient's urethra was examined and was normal.  The bladder was then systematically examined in its entirety. There was no evidence for any bladder tumors, stones, or other mucosal pathology.    Attention then turned to the left ureteral orifice and a ureteral catheter was used  to intubate the ureteral orifice.  Omnipaque contrast was injected through the ureteral catheter and a retrograde pyelogram was performed with findings as dictated above.  A 0.38 sensor guidewire was then advanced up the left ureter into the renal pelvis under fluoroscopic guidance.  The open-ended ureteral catheter was removed.  A second sensor wire was placed alongside the first sensor wire and advanced to the kidney and fluoroscopic guidance.  Next a ureteral access sheath was placed over one of the wires and advanced to the proximal ureter with fluoroscopic guidance.  The inner sheath and wire were removed.  Flexible ureteroscopy then took place which revealed 2 larger calculi in the lower pole. The stones were then fragmented with the 200 micron holmium laser fiber. All larger stones were then removed from the ureter with a 0 tip basket.  Reinspection of the ureter revealed no remaining visible stones or fragments.  The ureteroscope was removed and unison with the ureteral access sheath taking care to examine the ureter on the way out.  No trauma was noted to the ureter.  The wire was then backloaded through the cystoscope and a ureteral stent was advance over the wire using Seldinger technique.  The stent was positioned appropriately under fluoroscopic and cystoscopic guidance.  The wire was then removed with an adequate stent curl noted in the renal pelvis as well as in the bladder.  Attention then turned to the right side.  A right retrograde pyelogram was performed in the similar manner to the left.  No filling defects were noted.  A sensor wire was advanced through the open-ended ureteral catheter and the open-ended catheter was removed.  A second sensor was placed alongside the first with fluoroscopic guidance.  The ureteral access sheath was then placed over the second wire and advanced to the proximal ureter with fluoroscopic  guidance.  The inner sheath and wire were removed.  Flexible  ureteroscopy then took place.  There were no calculi identified.  There was noted to be infundibular narrowing in the lower pole preventing access to a calyx.  This infundibular stenosis was opened using the holmium laser.  After his open inspection of the calyx did not reveal any evidence of stones.  The ureteroscope was then removed and unison with the ureteral access sheath taking care to examine the ureter on the way out.  There is no evidence of stones or trauma to the ureter.  A 6 French by 24 cm stent with a tether was then placed in similar manner with fluoroscopic guidance.  The tether was left on intact in the vagina along with the other stent.  The bladder was then emptied and the procedure ended.  The patient appeared to tolerate the procedure well and without complications.  The patient was able to be awakened and transferred to the recovery unit in satisfactory condition.   Disposition: The tether of the stent was left on and tucked inside the patient's vagina.  Instructions for removing the stent have been provided to the patient.

## 2021-10-20 NOTE — OR Nursing (Signed)
Stone taken by Dr. Claudia Desanctis.

## 2021-10-20 NOTE — Anesthesia Procedure Notes (Signed)
Procedure Name: LMA Insertion Date/Time: 10/20/2021 9:53 AM  Performed by: Sharlette Dense, CRNAPatient Re-evaluated:Patient Re-evaluated prior to induction Oxygen Delivery Method: Circle system utilized Preoxygenation: Pre-oxygenation with 100% oxygen Induction Type: IV induction LMA: LMA inserted LMA Size: 4.0 Number of attempts: 2 Placement Confirmation: positive ETCO2 and breath sounds checked- equal and bilateral Tube secured with: Tape Dental Injury: Teeth and Oropharynx as per pre-operative assessment

## 2021-10-20 NOTE — Interval H&P Note (Signed)
History and Physical Interval Note:  10/20/2021 8:20 AM  Christine [name]  has presented today for surgery, with the diagnosis of RENAL CALCULI.  The various methods of treatment have been discussed with the patient and family. After consideration of risks, benefits and other options for treatment, the patient has consented to  Procedure(s): CYSTOSCOPY WITH RETROGRADE PYELOGRAM, URETEROSCOPY AND STENT PLACEMENT (Bilateral) HOLMIUM LASER APPLICATION (Bilateral) as a surgical intervention.  The patient's history has been reviewed, patient examined, no change in status, stable for surgery.  I have reviewed the patient's chart and labs.  Questions were answered to the patient's satisfaction.     Christine [name] D Christine [name]

## 2021-10-20 NOTE — Discharge Instructions (Signed)
DISCHARGE INSTRUCTIONS FOR KIDNEY STONE/URETERAL STENT   MEDICATIONS:  1. Resume all your other meds from home  2. AZO over the counter can help with the burning/stinging when you urinate. 3. Tramadol is for moderate/severe pain, otherwise taking up to 1000 mg every 6 hours of plainTylenol will help treat your pain.   4. Take Cipro one hour prior to removal of your stent.    ACTIVITY:  1. No strenuous activity x 1week  2. No driving while on narcotic pain medications  3. Drink plenty of water  4. Continue to walk at home - you can still get blood clots when you are at home, so keep active, but don't over do it.  5. May return to work/school tomorrow or when you feel ready   BATHING:  1. You can shower and we recommend daily showers  2. You have a string coming from your urethra: The stent string is attached to your ureteral stent. Do not pull on this.   SIGNS/SYMPTOMS TO CALL:  Please call us if you have a fever greater than 101.5, uncontrolled nausea/vomiting, uncontrolled pain, dizziness, unable to urinate, bloody urine, chest pain, shortness of breath, leg swelling, leg pain, redness around wound, drainage from wound, or any other concerns or questions.   You can reach [place] at [phone].   FOLLOW-UP:  1. You have a string attached to your stent, you may remove it on Friday, October 20. To do this, pull the strings until the stents are completely removed. You may feel an odd sensation in your back.

## 2021-10-20 NOTE — Anesthesia Preprocedure Evaluation (Signed)
Anesthesia Evaluation  Patient identified by MRN, date of birth, ID band Patient awake    Reviewed: Allergy & Precautions, NPO status , Patient's Chart, lab work & pertinent test results  History of Anesthesia Complications (+) PONV and history of anesthetic complications  Airway Mallampati: II  TM Distance: >3 FB Neck ROM: Full    Dental no notable dental hx. (+) Teeth Intact, Dental Advisory Given   Pulmonary asthma ,    Pulmonary exam normal breath sounds clear to auscultation       Cardiovascular hypertension, Pt. on medications Normal cardiovascular exam Rhythm:Regular Rate:Normal     Neuro/Psych  Headaches, PSYCHIATRIC DISORDERS Depression    GI/Hepatic Neg liver ROS, hiatal hernia, GERD  Controlled,  Endo/Other  Morbid obesity (BMI 44)  Renal/GU Renal InsufficiencyRenal disease  negative genitourinary   Musculoskeletal  (+) Arthritis , Osteoarthritis,    Abdominal (+) + obese,   Peds  Hematology negative hematology ROS (+)   Anesthesia Other Findings   Reproductive/Obstetrics                             Anesthesia Physical  Anesthesia Plan  ASA: 3  Anesthesia Plan: General   Post-op Pain Management:    Induction: Intravenous  PONV Risk Score and Plan: 4 or greater and Ondansetron, Dexamethasone, Midazolam and Droperidol  Airway Management Planned: LMA  Additional Equipment:   Intra-op Plan:   Post-operative Plan: Extubation in OR  Informed Consent: I have reviewed the patients History and Physical, chart, labs and discussed the procedure including the risks, benefits and alternatives for the proposed anesthesia with the patient or authorized representative who has indicated his/her understanding and acceptance.     Dental advisory given  Plan Discussed with: CRNA  Anesthesia Plan Comments:         Anesthesia Quick Evaluation

## 2021-10-20 NOTE — Transfer of Care (Signed)
Immediate Anesthesia Transfer of Care Note  Patient: Christine [name]  Procedure(s) Performed: CYSTOSCOPY WITH RETROGRADE PYELOGRAM, URETEROSCOPY AND STENT PLACEMENT (Bilateral) HOLMIUM LASER APPLICATION, RIGHT LASER OF INFUNDIBULAR STENOSIS (Bilateral)  Patient Location: PACU  Anesthesia Type:General  Level of Consciousness: awake  Airway & Oxygen Therapy: Patient Spontanous Breathing and Patient connected to face mask oxygen  Post-op Assessment: Report given to RN and Post -op Vital signs reviewed and stable  Post vital signs: Reviewed and stable  Last Vitals:  Vitals Value Taken Time  BP    Temp    Pulse 92 10/20/21 1118  Resp 15 10/20/21 1118  SpO2 100 % 10/20/21 1118  Vitals shown include unvalidated device data.  Last Pain:  Vitals:   10/20/21 0826  TempSrc:   PainSc: 1          Complications: No notable events documented.

## 2021-10-20 NOTE — Anesthesia Postprocedure Evaluation (Signed)
Anesthesia Post Note  Patient: Christine [name]  Procedure(s) Performed: CYSTOSCOPY WITH RETROGRADE PYELOGRAM, URETEROSCOPY AND STENT PLACEMENT (Bilateral) HOLMIUM LASER APPLICATION, RIGHT LASER OF INFUNDIBULAR STENOSIS (Bilateral)     Patient location during evaluation: PACU Anesthesia Type: General Level of consciousness: awake and alert Pain management: pain level controlled Vital Signs Assessment: post-procedure vital signs reviewed and stable Respiratory status: spontaneous breathing, nonlabored ventilation and respiratory function stable Cardiovascular status: blood pressure returned to baseline and stable Postop Assessment: no apparent nausea or vomiting Anesthetic complications: no   No notable events documented.  Last Vitals:  Vitals:   10/20/21 1200 10/20/21 1208  BP: 134/70   Pulse: 85 85  Resp: 12 11  Temp: 36.6 C   SpO2: 95% 94%    Last Pain:  Vitals:   10/20/21 1220  TempSrc:   PainSc: 0-No pain                 Lynda Rainwater

## 2021-10-21 ENCOUNTER — Encounter (HOSPITAL_COMMUNITY): Payer: Self-pay | Admitting: Urology

## 2021-10-21 ENCOUNTER — Encounter: Payer: Self-pay | Admitting: Internal Medicine

## 2021-10-22 ENCOUNTER — Encounter: Payer: Self-pay | Admitting: Internal Medicine

## 2021-10-26 ENCOUNTER — Encounter: Payer: Self-pay | Admitting: Internal Medicine

## 2021-11-03 DIAGNOSIS — N200 Calculus of kidney: Secondary | ICD-10-CM | POA: Diagnosis not present

## 2021-11-09 ENCOUNTER — Encounter: Payer: Self-pay | Admitting: Internal Medicine

## 2021-11-11 NOTE — Progress Notes (Signed)
Future Appointments  Date Time Provider Department  11/12/2021  9:30 AM [name], MD [facility]  04/01/2022 11:00 AM Darrol Jump, NP [facility]  07/12/2022  9:00 AM Darrol Jump, NP [facility]    History of Present Illness:      This very nice 66 y.o. single WF  presents for 6 month follow up with HTN, HLD, Morbid Obesity & fatty Liver,  Pre-Diabetes and Vitamin D Deficiency. Patient also has GERD controlled on her meds.        ~ 3  # weeks ago, patient had Cystoscopy, Retrograde Pyelogram, Ureteroscopy  & Stent placement for kidney stones by Dr Arnette Schaumann.            Note patient has Morbid Obesity ( 256 # /BMI 46.82  in July 2023) which she attributes to dietary indiscretions.        Patient is treated for HTN since 2000  & BP has been controlled at home. Today's BP is at goal  -  138/80 .  Patient has had no complaints of any cardiac type chest pain, palpitations, dyspnea / orthopnea / PND, dizziness, claudication, or dependent edema.       Hyperlipidemia is controlled with diet & Rosuvastatin. Patient denies myalgias or other med SE's. Last Lipids were at goal except elevated Trig's:  Lab Results  Component Value Date   CHOL 120 07/09/2021   HDL 42 (L) 07/09/2021   LDLCALC 52 07/09/2021   TRIG 180 (H) 07/09/2021   CHOLHDL 2.9 07/09/2021     Also, the patient has Morbid Obesity (BMI 47+) and  PreDiabetes (A1c 5.7% /2015 & 5.9% /2016)  and has had no symptoms of reactive hypoglycemia, diabetic polys, paresthesias or visual blurring.  Last A1c was normal & at goal:  Lab Results  Component Value Date   HGBA1C 5.3 07/09/2021   Wt Readings from Last 3 Encounters:  11/12/21 258 lb 3.2 oz (117.1 kg)  10/20/21 257 lb 15 oz (117 kg)  09/30/21 260 lb (117.9 kg)                                                       Further, the patient also has history of Vitamin D Deficiency ("11"/2008) and supplements vitamin D without any suspected side-effects. Last  vitamin D was at goal:  Lab Results  Component Value Date   VD25OH 57 07/09/2021       Current Outpatient Medications  Medication Instructions   B-12    2500 mcg SL 1 tablet  weekly   cyclobenzaprine 10 MG tablet TAKE 1/2 to 1 TABLET  3 x /day prn    diphenhydramine-Apap  25-500 MG TABS 2 tablets  Daily at bedtime   magnesium    500 mg Daily   Meloxicam    15 MG tablet TAKE 1/2 TO 1 TABLET DAILY AS NEEDED    montelukast  10 MG tablet TAKE 1 TABLET AT BEDTIME   oxybutynin   5 MG tablet TAKE 1 TABLET 3 TIMES A DAY   Pantoprazole 40 mg Daily   rosuvastatin  20 MG tablet TAKE 1 TABLET DAILY FOR    CHOLESTEROL   traMADol    50 mg Every 6 hours PRN   Valsartan     320 mg Daily   Vitamin D  10,000 Units  Daily   vitamin E    400 Units Daily     Allergies  Allergen Reactions   Keflex [Cephalexin] Anaphylaxis and Swelling   Codeine     REACTION: nausea/vomiting   Wellbutrin [Bupropion] Diarrhea     PMHx:   Past Medical History:  Diagnosis Date   Allergy    Arthritis    Asthma    as child but out grew   Cataract    small forming   Environmental allergies    GERD (gastroesophageal reflux disease)    Headache    Hiatal hernia    with stricture dilation   Hyperlipidemia    down on meds   Hypertension    Kidney stones 2008   again 8- 2022   Obesity, unspecified    PONV (postoperative nausea and vomiting)    UTI (urinary tract infection)      Immunization History  Administered Date(s) Administered   Influenza Inj Mdck Quad  11/02/2016, 10/23/2019   Influenza Inj Mdck Quad  10/23/2018   Influenza, Quadrivalent 10/06/2017   Influenza 11/22/2014, 10/31/2015, 10/06/2017   PFIZER SARS-COV-2 Vacc 04/30/2019, 05/21/2019, 11/23/2019   PPD Test 06/01/2013   Pneumococcal-23 06/08/2018   Tdap 03/22/2017     Past Surgical History:  Procedure Laterality Date   BREAST BIOPSY Left    benign   CHOLECYSTECTOMY  1987   COLONOSCOPY     ESOPHAGEAL DILATION     EYE SURGERY      to remove film over eyes   KNEE ARTHROSCOPY Right 2007   REVERSE SHOULDER ARTHROPLASTY Right 06/04/2014   REVERSE SHOULDER ARTHROPLASTY; Meredith Pel, MD   TOTAL HIP ARTHROPLASTY Right 03/19/2014   TOTAL HIP ARTHROPLASTY ANTERIOR APPROACH; Meredith Pel, MD   UPPER GASTROINTESTINAL ENDOSCOPY      FHx:    Reviewed / unchanged  SHx:    Reviewed / unchanged   Systems Review:  Constitutional: Denies fever, chills, wt changes, headaches, insomnia, fatigue, night sweats, change in appetite. Eyes: Denies redness, blurred vision, diplopia, discharge, itchy, watery eyes.  ENT: Denies discharge, congestion, post nasal drip, epistaxis, sore throat, earache, hearing loss, dental pain, tinnitus, vertigo, sinus pain, snoring.  CV: Denies chest pain, palpitations, irregular heartbeat, syncope, dyspnea, diaphoresis, orthopnea, PND, claudication or edema. Respiratory: denies cough, dyspnea, DOE, pleurisy, hoarseness, laryngitis, wheezing.  Gastrointestinal: Denies dysphagia, odynophagia, heartburn, reflux, water brash, abdominal pain or cramps, nausea, vomiting, bloating, diarrhea, constipation, hematemesis, melena, hematochezia  or hemorrhoids. Genitourinary: Denies dysuria, frequency, urgency, nocturia, hesitancy, discharge, hematuria or flank pain. Musculoskeletal: Denies arthralgias, myalgias, stiffness, jt. swelling, pain, limping or strain/sprain.  Skin: Denies pruritus, rash, hives, warts, acne, eczema or change in skin lesion(s). Neuro: No weakness, tremor, incoordination, spasms, paresthesia or pain. Psychiatric: Denies confusion, memory loss or sensory loss. Endo: Denies change in weight, skin or hair change.  Heme/Lymph: No excessive bleeding, bruising or enlarged lymph nodes.  Physical Exam  BP 138/80   Pulse 84   Temp 97.6 F (36.4 C)   Resp 16   Ht '5\' 2"'$  (1.575 m)   Wt 258 lb 3.2 oz (117.1 kg)   LMP 10/18/2013   SpO2 96%   BMI 47.23 kg/m   Appears  over   nourished  and in no distress.  Eyes: PERRLA, EOMs, conjunctiva no swelling or erythema. Sinuses: No frontal/maxillary tenderness ENT/Mouth: EAC's clear, TM's nl w/o erythema, bulging. Nares clear w/o erythema, swelling, exudates. Oropharynx clear without erythema or exudates. Oral hygiene is good. Tongue normal, non obstructing. Hearing  intact.  Neck: Supple. Thyroid not palpable. Car 2+/2+ without bruits, nodes or JVD. Chest: Respirations nl with BS clear & equal w/o rales, rhonchi, wheezing or stridor.  Cor: Heart sounds normal w/ regular rate and rhythm without sig. murmurs, gallops, clicks or rubs. Peripheral pulses normal and equal  without edema.  Abdomen: Soft & bowel sounds normal. Non-tender w/o guarding, rebound, hernias, masses or organomegaly.  Lymphatics: Unremarkable.  Musculoskeletal: Full ROM all peripheral extremities, joint stability, 5/5 strength and normal gait.  Skin: Warm, dry without exposed rashes, lesions or ecchymosis apparent.  Neuro: Cranial nerves intact, reflexes equal bilaterally. Sensory-motor testing grossly intact. Tendon reflexes grossly intact.  Pysch: Alert & oriented x 3.  Insight and judgement nl & appropriate. No ideations.  Assessment and Plan:  1. Essential hypertension  - Continue medication, monitor blood pressure at home.  - Continue DASH diet.  Reminder to go to the ER if any CP,  SOB, nausea, dizziness, severe HA, changes vision/speech.    - CBC with Differential/Platelet - COMPLETE METABOLIC PANEL WITH GFR - Magnesium - TSH  2. Hyperlipidemia, mixed  - Lipid panel - TSH  3. Abnormal glucose  - Continue diet, exercise  - Lifestyle modifications.  - Monitor appropriate labs   - Fructosamine - Insulin, random  4. Vitamin D deficiency  - Continue supplementation   - VITAMIN D 25 Hydroxy   5. Fatty liver  - COMPLETE METABOLIC PANEL WITH GFR  6. Morbid obesity with BMI of 45.0-49.9, adult (HCC)  - COMPLETE METABOLIC  PANEL WITH GFR - Lipid panel - Fructosamine  7. Medication management  - CBC with Differential/Platelet - COMPLETE METABOLIC PANEL WITH GFR - Magnesium - Lipid panel - TSH - Fructosamine - Insulin, random - VITAMIN D 25 Hydroxy          Discussed  regular exercise, BP monitoring, weight control to achieve/maintain BMI less than 25 and discussed med and SE's. Recommended labs to assess and monitor clinical status with further disposition pending results of labs.  I discussed the assessment and treatment plan with the patient. The patient was provided an opportunity to ask questions and all were answered. The patient agreed with the plan and demonstrated an understanding of the instructions.  I provided over 30 minutes of exam, counseling, chart review and  complex critical decision making.        The patient was advised to call back or seek an in-person evaluation if the symptoms worsen or if the condition fails to improve as anticipated.   Kirtland Bouchard, MD

## 2021-11-12 ENCOUNTER — Ambulatory Visit (INDEPENDENT_AMBULATORY_CARE_PROVIDER_SITE_OTHER): Payer: Medicare HMO | Admitting: Internal Medicine

## 2021-11-12 ENCOUNTER — Encounter: Payer: Self-pay | Admitting: Internal Medicine

## 2021-11-12 VITALS — BP 138/80 | HR 84 | Temp 97.6°F | Resp 16 | Ht 62.0 in | Wt 258.2 lb

## 2021-11-12 DIAGNOSIS — Z6841 Body Mass Index (BMI) 40.0 and over, adult: Secondary | ICD-10-CM | POA: Diagnosis not present

## 2021-11-12 DIAGNOSIS — E782 Mixed hyperlipidemia: Secondary | ICD-10-CM

## 2021-11-12 DIAGNOSIS — Z23 Encounter for immunization: Secondary | ICD-10-CM

## 2021-11-12 DIAGNOSIS — K760 Fatty (change of) liver, not elsewhere classified: Secondary | ICD-10-CM | POA: Diagnosis not present

## 2021-11-12 DIAGNOSIS — I10 Essential (primary) hypertension: Secondary | ICD-10-CM | POA: Diagnosis not present

## 2021-11-12 DIAGNOSIS — E559 Vitamin D deficiency, unspecified: Secondary | ICD-10-CM

## 2021-11-12 DIAGNOSIS — R7309 Other abnormal glucose: Secondary | ICD-10-CM | POA: Diagnosis not present

## 2021-11-12 DIAGNOSIS — Z79899 Other long term (current) drug therapy: Secondary | ICD-10-CM

## 2021-11-12 NOTE — Patient Instructions (Signed)
Due to recent changes in healthcare laws, you may see the results of your imaging and laboratory studies on MyChart before your provider has had a chance to review them.  We understand that in some cases there may be results that are confusing or concerning to you. Not all laboratory results come back in the same time frame and the provider may be waiting for multiple results in order to interpret others.  Please give us 48 hours in order for your provider to thoroughly review all the results before contacting the office for clarification of your results.  ++++++++++++++++++++++++++  Vit D  & Vit C 1,000 mg   are recommended to help protect  against the Covid-19 and other Corona viruses.    Also it's recommended  to take  Zinc 50 mg  to help  protect against the Covid-19   and best place to get  is also on [employer]  and don't pay more than 6-8 cents /pill !   +++++++++++++++++++++++++++++++++++++++ Recommend Adult Low Dose Aspirin or  coated  Aspirin 81 mg daily  To reduce risk of Colon Cancer 40 %,  Skin Cancer 26 % ,  Melanoma 46%  and  Pancreatic cancer 60% +++++++++++++++++++++++++++++++++++++++++ Vitamin D goal  is between 70-100.  Please make sure that you are taking your Vitamin D as directed.  It is very important as a natural anti-inflammatory  helping hair, skin, and nails, as well as reducing stroke and heart attack risk.  It helps your bones and helps with mood. It also decreases numerous cancer risks so please take it as directed.  Low Vit D is associated with a 200-300% higher risk for CANCER  and 200-300% higher risk for HEART   ATTACK  &  STROKE.   ...................................... It is also associated with higher death rate at younger ages,  autoimmune diseases like Rheumatoid arthritis, Lupus, Multiple Sclerosis.    Also many other serious conditions, like depression, Alzheimer's Dementia, infertility, muscle aches, fatigue, fibromyalgia - just to name  a few. +++++++++++++++++++++++++++++++++++++++++ Recommend the book "The END of DIETING" by Dr Joel Fuhrman  & the book "The END of DIABETES " by Dr Joel Fuhrman At [employer] - get book & Audio CD's    Being diabetic has a  300% increased risk for heart attack, stroke, cancer, and alzheimer- type vascular dementia. It is very important that you work harder with diet by avoiding all foods that are white. Avoid white rice (brown & wild rice is OK), white potatoes (sweetpotatoes in moderation is OK), White bread or wheat bread or anything made out of white flour like bagels, donuts, rolls, buns, biscuits, cakes, pastries, cookies, pizza crust, and pasta (made from white flour & egg whites) - vegetarian pasta or spinach or wheat pasta is OK. Multigrain breads like Arnold's or [employer], or multigrain sandwich thins or flatbreads.  Diet, exercise and weight loss can reverse and cure diabetes in the early stages.  Diet, exercise and weight loss is very important in the control and prevention of complications of diabetes which affects every system in your body, ie. Brain - dementia/stroke, eyes - glaucoma/blindness, heart - heart attack/heart failure, kidneys - dialysis, stomach - gastric paralysis, intestines - malabsorption, nerves - severe painful neuritis, circulation - gangrene & loss of a leg(s), and finally cancer and Alzheimers.    I recommend avoid fried & greasy foods,  sweets/candy, white rice (brown or wild rice or Quinoa is OK), white potatoes (sweet potatoes are OK) - anything   made from white flour - bagels, doughnuts, rolls, buns, biscuits,white and wheat breads, pizza crust and traditional pasta made of white flour & egg white(vegetarian pasta or spinach or wheat pasta is OK).  Multi-grain bread is OK - like multi-grain flat bread or sandwich thins. Avoid alcohol in excess. Exercise is also important.    Eat all the vegetables you want - avoid meat, especially red meat and dairy - especially  cheese.  Cheese is the most concentrated form of trans-fats which is the worst thing to clog up our arteries. Veggie cheese is OK which can be found in the fresh produce section at [employer] or [employer] or [employer]  +++++++++++++++++++++++++++++++++++++++ DASH Eating Plan  DASH stands for "Dietary Approaches to Stop Hypertension."   The DASH eating plan is a healthy eating plan that has been shown to reduce high blood pressure (hypertension). Additional health benefits may include reducing the risk of type 2 diabetes mellitus, heart disease, and stroke. The DASH eating plan may also help with weight loss. WHAT DO I NEED TO KNOW ABOUT THE DASH EATING PLAN? For the DASH eating plan, you will follow these general guidelines: Choose foods with a percent daily value for sodium of less than 5% (as listed on the food label). Use salt-free seasonings or herbs instead of table salt or sea salt. Check with your health care provider or pharmacist before using salt substitutes. Eat lower-sodium products, often labeled as "lower sodium" or "no salt added." Eat fresh foods. Eat more vegetables, fruits, and low-fat dairy products. Choose whole grains. Look for the word "whole" as the first word in the ingredient list. Choose fish  Limit sweets, desserts, sugars, and sugary drinks. Choose heart-healthy fats. Eat veggie cheese  Eat more home-cooked food and less restaurant, buffet, and fast food. Limit fried foods. Cook foods using methods other than frying. Limit canned vegetables. If you do use them, rinse them well to decrease the sodium. When eating at a restaurant, ask that your food be prepared with less salt, or no salt if possible.                      WHAT FOODS CAN I EAT? Read Dr Joel Fuhrman's books on The End of Dieting & The End of Diabetes  Grains Whole grain or whole wheat bread. Brown rice. Whole grain or whole wheat pasta. Quinoa, bulgur, and whole grain cereals. Low-sodium  cereals. Corn or whole wheat flour tortillas. Whole grain cornbread. Whole grain crackers. Low-sodium crackers.  Vegetables Fresh or frozen vegetables (raw, steamed, roasted, or grilled). Low-sodium or reduced-sodium tomato and vegetable juices. Low-sodium or reduced-sodium tomato sauce and paste. Low-sodium or reduced-sodium canned vegetables.   Fruits All fresh, canned (in natural juice), or frozen fruits.  Protein Products  All fish and seafood.  Dried beans, peas, or lentils. Unsalted nuts and seeds. Unsalted canned beans.  Dairy Low-fat dairy products, such as skim or 1% milk, 2% or reduced-fat cheeses, low-fat ricotta or cottage cheese, or plain low-fat yogurt. Low-sodium or reduced-sodium cheeses.  Fats and Oils Tub margarines without trans fats. Light or reduced-fat mayonnaise and salad dressings (reduced sodium). Avocado. Safflower, olive, or canola oils. Natural peanut or almond butter.  Other Unsalted popcorn and pretzels. The items listed above may not be a complete list of recommended foods or beverages. Contact your dietitian for more options.  +++++++++++++++  WHAT FOODS ARE NOT RECOMMENDED? Grains/ White flour or wheat flour White bread. White pasta. White rice. Refined   cornbread. Bagels and croissants. Crackers that contain trans fat.  Vegetables  Creamed or fried vegetables. Vegetables in a . Regular canned vegetables. Regular canned tomato sauce and paste. Regular tomato and vegetable juices.  Fruits Dried fruits. Canned fruit in light or heavy syrup. Fruit juice.  Meat and Other Protein Products Meat in general - RED meat & White meat.  Fatty cuts of meat. Ribs, chicken wings, all processed meats as bacon, sausage, bologna, salami, fatback, hot dogs, bratwurst and packaged luncheon meats.  Dairy Whole or 2% milk, cream, half-and-half, and cream cheese. Whole-fat or sweetened yogurt. Full-fat cheeses or blue cheese. Non-dairy creamers and whipped toppings.  Processed cheese, cheese spreads, or cheese curds.  Condiments Onion and garlic salt, seasoned salt, table salt, and sea salt. Canned and packaged gravies. Worcestershire sauce. Tartar sauce. Barbecue sauce. Teriyaki sauce. Soy sauce, including reduced sodium. Steak sauce. Fish sauce. Oyster sauce. Cocktail sauce. Horseradish. Ketchup and mustard. Meat flavorings and tenderizers. Bouillon cubes. Hot sauce. Tabasco sauce. Marinades. Taco seasonings. Relishes.  Fats and Oils Butter, stick margarine, lard, shortening and bacon fat. Coconut, palm kernel, or palm oils. Regular salad dressings.  Pickles and olives. Salted popcorn and pretzels.  The items listed above may not be a complete list of foods and beverages to avoid. ============================  Fatty Liver Disease   The liver converts food into energy, removes toxic material from the blood, makes important proteins, and absorbs necessary vitamins from food. Fatty liver disease occurs when too much fat has built up in your liver cells. Fatty liver disease is also called hepatic steatosis. In many cases, fatty liver disease does not cause symptoms or problems. It is often diagnosed when tests are being done for other reasons. However, over time, fatty liver can cause inflammation that may lead to more serious liver problems, such as scarring of the liver (cirrhosis) and liver failure. Fatty liver is associated with insulin resistance, increased body fat, high blood pressure (hypertension), and high cholesterol. These are features of metabolic syndrome and increase your risk for stroke, diabetes, and heart disease. What are the causes? This condition may be caused by components of metabolic syndrome: Obesity. Insulin resistance. High cholesterol. Other causes: Alcohol abuse. Poor nutrition. Cushing syndrome. Pregnancy. Certain drugs. Poisons. Some viral infections. What increases the risk? You are more likely to develop this  condition if you: Abuse alcohol. Are overweight. Have diabetes. Have hepatitis. Have a high triglyceride level. Are pregnant. What are the signs or symptoms? Fatty liver disease often does not cause symptoms. If symptoms do develop, they can include: Fatigue and weakness. Weight loss. Confusion. Nausea, vomiting, or abdominal pain. Yellowing of your skin and the white parts of your eyes (jaundice). Itchy skin. How is this diagnosed? This condition may be diagnosed by: A physical exam and your medical history. Blood tests. Imaging tests, such as an ultrasound, CT scan, or MRI. A liver biopsy. A small sample of liver tissue is removed using a needle. The sample is then looked at under a microscope. How is this treated? Fatty liver disease is often caused by other health conditions. Treatment for fatty liver may involve medicines and lifestyle changes to manage conditions such as: Alcoholism. High cholesterol. Diabetes. Being overweight or obese. Follow these instructions at home:  Do not drink alcohol. If you have trouble quitting, ask your health care provider how to safely quit with the help of medicine or a supervised program. This is important to keep your condition from getting worse. Eat a healthy  diet as told by your health care provider. Ask your health care provider about working with a dietitian to develop an eating plan. Exercise regularly. This can help you lose weight and control your cholesterol and diabetes. Talk to your health care provider about an exercise plan and which activities are best for you. Take over-the-counter and prescription medicines only as told by your health care provider. Keep all follow-up visits. This is important. Contact a health care provider if: You have trouble controlling your: Blood sugar. This is especially important if you have diabetes. Cholesterol. Drinking of alcohol. Get help right away if: You have abdominal pain. You have  jaundice. You have nausea and are vomiting. You vomit blood or material that looks like coffee grounds. You have stools that are black, tar-like, or bloody. Summary Fatty liver disease develops when too much fat builds up in the cells of your liver. Fatty liver disease often causes no symptoms or problems. However, over time, fatty liver can cause inflammation that may lead to more serious liver problems, such as scarring of the liver (cirrhosis). You are more likely to develop this condition if you abuse alcohol, are pregnant, are overweight, have diabetes, have hepatitis, or have high triglyceride or cholesterol levels. Contact your health care provider if you have trouble controlling your blood sugar, cholesterol, or drinking of alcohol.

## 2021-11-12 NOTE — Progress Notes (Incomplete)
Future Appointments  Date Time Provider Department  11/12/2021  9:30 AM [name], MD [facility]  04/01/2022 11:00 AM Darrol Jump, NP [facility]  07/12/2022  9:00 AM Darrol Jump, NP [facility]   History of Present Illness:      This very nice 66 y.o. single WF  presents for 6 month follow up with HTN, HLD, Pre-Diabetes and Vitamin D Deficiency. Patient also has GERD controlled on her meds.        # weeks a go, patient had Cystoscopy, retrograde pyelogram, ureteroscopy  & Stent placement for kidney stones by Dr Jacalyn Lefevre.            Note patient has Morbid Obesity (Wt 239# /BMI 43.86 with 55# weight loss from 294# July 2020)  which she attributes to better eating choices.         Patient is treated for HTN (2000) & BP has been controlled at home. Today's BP is at goal. 140/82. Patient has had no complaints of any cardiac type chest pain, palpitations, dyspnea / orthopnea / PND, dizziness, claudication, or dependent edema.       Hyperlipidemia is controlled with diet & Rosuvastatin. Patient denies myalgias or other med SE's. Last Lipids were at goal except elevated Trig's:  Lab Results  Component Value Date   CHOL 132 07/09/2020   HDL 44 (L) 07/09/2020   LDLCALC 61 07/09/2020   TRIG 209 (H) 07/09/2020   CHOLHDL 3.0 07/09/2020     Also, the patient has Morbid Obesity (BMI 45+) and  PreDiabetes (A1c 5.7% /2015 & 5.9% /2016)  and has had no symptoms of reactive hypoglycemia, diabetic polys, paresthesias or visual blurring.  Last A1c was normal & at goal:  Lab Results  Component Value Date   HGBA1C 5.3 07/09/2020                                                         Further, the patient also has history of Vitamin D Deficiency ("11"/2008) and supplements vitamin D without any suspected side-effects. Last vitamin D was at goal:  Lab Results  Component Value Date   VD25OH 82 03/31/2020     Current Outpatient Medications on File Prior to Visit   Medication Sig  . VITAMIN D 1000 UNITS tablet Take  3  times daily.   . B-12 2500 MCG SUBL Place 1 tablet under the tongue once a week.  . cyclobenzaprine 10 MG tablet TAKE 1/2 1 TABLET  3 x/day  DAY AS NEEDED FOR MUSCLE SPASMS  . diphenhydramine-apap 25-500 MG TABS Take 2 tablets  at bedtime as needed.  . Magnesium 500 MG tablet Take 2 (two) times daily.  . meloxicam  15 MG tablet TAKE 1/2 TO 1 TABLET DAILY WITH FOOD   . oxybutynin (DITROPAN) 5 MG tablet TAKE 1 TABLET 3 TIMES A DAY  . Probiotic Product  Take 1 tablet  daily.  . rosuvastatin 20 MG tablet Take  1 tablet  Daily  for Cholesterol  . valsartan-hctz 320-12.5 MG tablet Take 1 tablet Daily for BP  . vitamin E 180 MG  / 400 UNITS Take 1 capsule (400 Units total) by mouth daily.     Allergies  Allergen Reactions  . Keflex [Cephalexin] Anaphylaxis and Swelling  . Codeine     REACTION: nausea/vomiting  .  Wellbutrin [Bupropion] Diarrhea     PMHx:   Past Medical History:  Diagnosis Date  . Allergy   . Arthritis   . Asthma    as child but out grew  . Cataract    small forming  . Environmental allergies   . GERD (gastroesophageal reflux disease)   . Headache   . Hiatal hernia    with stricture dilation  . Hyperlipidemia    down on meds  . Hypertension   . Kidney stones 2008   again 8- 2022  . Obesity, unspecified   . PONV (postoperative nausea and vomiting)   . UTI (urinary tract infection)      Immunization History  Administered Date(s) Administered  . Influenza Inj Mdck Quad  11/02/2016, 10/23/2019  . Influenza Inj Mdck Quad  10/23/2018  . Influenza, Quadrivalent 10/06/2017  . Influenza 11/22/2014, 10/31/2015, 10/06/2017  . PFIZER SARS-COV-2 Vacc 04/30/2019, 05/21/2019, 11/23/2019  . PPD Test 06/01/2013  . Pneumococcal-23 06/08/2018  . Tdap 03/22/2017     Past Surgical History:  Procedure Laterality Date  . BREAST BIOPSY Left    benign  . CHOLECYSTECTOMY  1987  . COLONOSCOPY    . ESOPHAGEAL  DILATION    . EYE SURGERY     to remove film over eyes  . KNEE ARTHROSCOPY Right 2007  . REVERSE SHOULDER ARTHROPLASTY Right 06/04/2014   REVERSE SHOULDER ARTHROPLASTY; Meredith Pel, MD  . TOTAL HIP ARTHROPLASTY Right 03/19/2014   TOTAL HIP ARTHROPLASTY ANTERIOR APPROACH; Meredith Pel, MD  . UPPER GASTROINTESTINAL ENDOSCOPY      FHx:    Reviewed / unchanged  SHx:    Reviewed / unchanged   Systems Review:  Constitutional: Denies fever, chills, wt changes, headaches, insomnia, fatigue, night sweats, change in appetite. Eyes: Denies redness, blurred vision, diplopia, discharge, itchy, watery eyes.  ENT: Denies discharge, congestion, post nasal drip, epistaxis, sore throat, earache, hearing loss, dental pain, tinnitus, vertigo, sinus pain, snoring.  CV: Denies chest pain, palpitations, irregular heartbeat, syncope, dyspnea, diaphoresis, orthopnea, PND, claudication or edema. Respiratory: denies cough, dyspnea, DOE, pleurisy, hoarseness, laryngitis, wheezing.  Gastrointestinal: Denies dysphagia, odynophagia, heartburn, reflux, water brash, abdominal pain or cramps, nausea, vomiting, bloating, diarrhea, constipation, hematemesis, melena, hematochezia  or hemorrhoids. Genitourinary: Denies dysuria, frequency, urgency, nocturia, hesitancy, discharge, hematuria or flank pain. Musculoskeletal: Denies arthralgias, myalgias, stiffness, jt. swelling, pain, limping or strain/sprain.  Skin: Denies pruritus, rash, hives, warts, acne, eczema or change in skin lesion(s). Neuro: No weakness, tremor, incoordination, spasms, paresthesia or pain. Psychiatric: Denies confusion, memory loss or sensory loss. Endo: Denies change in weight, skin or hair change.  Heme/Lymph: No excessive bleeding, bruising or enlarged lymph nodes.  Physical Exam  LMP 10/18/2013   Appears  well nourished, well groomed  and in no distress.  Eyes: PERRLA, EOMs, conjunctiva no swelling or erythema. Sinuses: No  frontal/maxillary tenderness ENT/Mouth: EAC's clear, TM's nl w/o erythema, bulging. Nares clear w/o erythema, swelling, exudates. Oropharynx clear without erythema or exudates. Oral hygiene is good. Tongue normal, non obstructing. Hearing intact.  Neck: Supple. Thyroid not palpable. Car 2+/2+ without bruits, nodes or JVD. Chest: Respirations nl with BS clear & equal w/o rales, rhonchi, wheezing or stridor.  Cor: Heart sounds normal w/ regular rate and rhythm without sig. murmurs, gallops, clicks or rubs. Peripheral pulses normal and equal  without edema.  Abdomen: Soft & bowel sounds normal. Non-tender w/o guarding, rebound, hernias, masses or organomegaly.  Lymphatics: Unremarkable.  Musculoskeletal: Full ROM all peripheral extremities, joint stability,  5/5 strength and normal gait.  Skin: Warm, dry without exposed rashes, lesions or ecchymosis apparent.  Neuro: Cranial nerves intact, reflexes equal bilaterally. Sensory-motor testing grossly intact. Tendon reflexes grossly intact.  Pysch: Alert & oriented x 3.  Insight and judgement nl & appropriate. No ideations.  Assessment and Plan:  1. Essential hypertension  - Continue medication, monitor blood pressure at home.  - Continue DASH diet.  Reminder to go to the ER if any CP,  SOB, nausea, dizziness, severe HA, changes vision/speech.    - CBC with Differential/Platelet - COMPLETE METABOLIC PANEL WITH GFR - Magnesium - TSH  2. Hyperlipidemia, mixed  - Lipid panel - TSH  3. Abnormal glucose  - Continue diet, exercise  - Lifestyle modifications.  - Monitor appropriate labs   - Fructosamine - Insulin, random  4. Vitamin D deficiency  - Continue supplementation   - VITAMIN D 25 Hydroxy   5. Fatty liver  - COMPLETE METABOLIC PANEL WITH GFR  6. Morbid obesity with BMI of 45.0-49.9, adult (HCC)  - COMPLETE METABOLIC PANEL WITH GFR - Lipid panel - Fructosamine  7. Medication management  - CBC with  Differential/Platelet - COMPLETE METABOLIC PANEL WITH GFR - Magnesium - Lipid panel - TSH - Fructosamine - Insulin, random - VITAMIN D 25 Hydroxy          Discussed  regular exercise, BP monitoring, weight control to achieve/maintain BMI less than 25 and discussed med and SE's. Recommended labs to assess and monitor clinical status with further disposition pending results of labs.  I discussed the assessment and treatment plan with the patient. The patient was provided an opportunity to ask questions and all were answered. The patient agreed with the plan and demonstrated an understanding of the instructions.  I provided over 30 minutes of exam, counseling, chart review and  complex critical decision making.        The patient was advised to call back or seek an in-person evaluation if the symptoms worsen or if the condition fails to improve as anticipated.   Kirtland Bouchard, MD

## 2021-11-13 ENCOUNTER — Encounter: Payer: Self-pay | Admitting: Internal Medicine

## 2021-11-13 LAB — CBC WITH DIFFERENTIAL/PLATELET
Absolute Monocytes: 686 cells/uL (ref 200–950)
Basophils Absolute: 88 cells/uL (ref 0–200)
Basophils Relative: 1.2 %
Eosinophils Absolute: 409 cells/uL (ref 15–500)
Eosinophils Relative: 5.6 %
HCT: 41.9 % (ref 35.0–45.0)
Hemoglobin: 14.1 g/dL (ref 11.7–15.5)
Lymphs Abs: 2723 cells/uL (ref 850–3900)
MCH: 30.8 pg (ref 27.0–33.0)
MCHC: 33.7 g/dL (ref 32.0–36.0)
MCV: 91.5 fL (ref 80.0–100.0)
MPV: 10.1 fL (ref 7.5–12.5)
Monocytes Relative: 9.4 %
Neutro Abs: 3395 cells/uL (ref 1500–7800)
Neutrophils Relative %: 46.5 %
Platelets: 305 10*3/uL (ref 140–400)
RBC: 4.58 10*6/uL (ref 3.80–5.10)
RDW: 13.3 % (ref 11.0–15.0)
Total Lymphocyte: 37.3 %
WBC: 7.3 10*3/uL (ref 3.8–10.8)

## 2021-11-13 LAB — TSH: TSH: 2.09 mIU/L (ref 0.40–4.50)

## 2021-11-13 LAB — COMPLETE METABOLIC PANEL WITH GFR
AG Ratio: 1.4 (calc) (ref 1.0–2.5)
ALT: 20 U/L (ref 6–29)
AST: 24 U/L (ref 10–35)
Albumin: 4.6 g/dL (ref 3.6–5.1)
Alkaline phosphatase (APISO): 61 U/L (ref 37–153)
BUN/Creatinine Ratio: 18 (calc) (ref 6–22)
BUN: 20 mg/dL (ref 7–25)
CO2: 23 mmol/L (ref 20–32)
Calcium: 10.2 mg/dL (ref 8.6–10.4)
Chloride: 103 mmol/L (ref 98–110)
Creat: 1.14 mg/dL — ABNORMAL HIGH (ref 0.50–1.05)
Globulin: 3.3 g/dL (calc) (ref 1.9–3.7)
Glucose, Bld: 94 mg/dL (ref 65–99)
Potassium: 4.6 mmol/L (ref 3.5–5.3)
Sodium: 138 mmol/L (ref 135–146)
Total Bilirubin: 0.6 mg/dL (ref 0.2–1.2)
Total Protein: 7.9 g/dL (ref 6.1–8.1)
eGFR: 54 mL/min/{1.73_m2} — ABNORMAL LOW (ref >=60)

## 2021-11-13 LAB — LIPID PANEL
Cholesterol: 122 mg/dL (ref <200)
HDL: 44 mg/dL — ABNORMAL LOW (ref >=50)
LDL Cholesterol (Calc): 54 mg/dL (calc)
Non-HDL Cholesterol (Calc): 78 mg/dL (calc) (ref <130)
Total CHOL/HDL Ratio: 2.8 (calc) (ref <5.0)
Triglycerides: 162 mg/dL — ABNORMAL HIGH (ref <150)

## 2021-11-13 LAB — HEMOGLOBIN A1C
Hgb A1c MFr Bld: 5.7 % of total Hgb — ABNORMAL HIGH (ref <5.7)
Mean Plasma Glucose: 117 mg/dL
eAG (mmol/L): 6.5 mmol/L

## 2021-11-13 LAB — VITAMIN D 25 HYDROXY (VIT D DEFICIENCY, FRACTURES): Vit D, 25-Hydroxy: 50 ng/mL (ref 30–100)

## 2021-11-13 LAB — MAGNESIUM: Magnesium: 1.9 mg/dL (ref 1.5–2.5)

## 2021-11-13 LAB — INSULIN, RANDOM: Insulin: 53.9 u[IU]/mL — ABNORMAL HIGH

## 2021-11-13 NOTE — Progress Notes (Signed)
<><><><><><><><><><><><><><><><><><><><><><><><><><><><><><><><><> <><><><><><><><><><><><><><><><><><><><><><><><><><><><><><><><><> - Test results slightly outside the reference range are not unusual. If there is anything important, I will review this with you,  otherwise it is considered normal test values.  If you have further questions,  please do not hesitate to contact me at the office or via My Chart.  <><><><><><><><><><><><><><><><><><><><><><><><><><><><><><><><><> <><><><><><><><><><><><><><><><><><><><><><><><><><><><><><><><><>  -  Insulin level = 53 .9   is very high                     (  Normal is less than 20  !  )   &  shows insulin resistance - a sign of early diabetes and associated with   a 300 % greater risk for                    heart attacks, strokes, cancer & Alzheimer type vascular dementia   - All this can be cured  and prevented with losing weight   - get Dr Fara Olden Fuhrman's book 'the End of Diabetes" and "the End of Dieting  <><><><><><><><><><><><><><><><><><><><><><><><><><><><><><><><><>  -  Total Chol = 122    & LDL Chol = 54   - Both  Excellent   - Very low risk for Heart Attack  / Stroke <><><><><><><><><><><><><><><><><><><><><><><><><><><><><><><><><>  -  A1c = 5.7 % is slightly elevated -Blood sugar and A1c are elevated in the borderline and  early or pre-diabetes range which has the same   300% increased risk for heart attack, stroke, cancer and                                               alzheimer- type vascular dementia as full blown diabetes.   But the good news is that diet, exercise with weight loss can \                                                                                 cure the early diabetes at this point. <><><><><><><><><><><><><><><><><><><><><><><><><><><><><><><><><> <><><><><><><><><><><><><><><><><><><><><><><><><><><><><><><><><>  - Vitamin D = 50 is a little low   - Vitamin D goal is between 70-100.   -  Please make sure that you are taking your Vitamin D as directed.   - It is very important as a natural anti-inflammatory and helping the  immune system protect against viral infections, like the Covid-19    helping hair, skin, and nails, as well as reducing stroke and  heart attack risk.   - It helps your bones and helps with mood.  - It also decreases numerous cancer risks so please  take it as directed.   - Low Vit D is associated with a 200-300% higher risk for  CANCER   and 200-300% higher risk for HEART   ATTACK  &  STROKE.    - It is also associated with higher death rate at younger ages,   autoimmune diseases like Rheumatoid arthritis, Lupus,  Multiple Sclerosis.     - Also many other serious conditions, like depression, Alzheimer's  Dementia, infertility, muscle aches, fatigue, fibromyalgia   <><><><><><><><><><><><><><><><><><><><><><><><><><><><><><><><><> <><><><><><><><><><><><><><><><><><><><><><><><><><><><><><><><><>  All Else - CBC - Kidneys - Electrolytes - Liver - Magnesium & Thyroid    - all  Normal / OK <><><><><><><><><><><><><><><><><><><><><><><><><><><><><><><><><>

## 2021-11-15 ENCOUNTER — Encounter: Payer: Self-pay | Admitting: Internal Medicine

## 2021-11-16 ENCOUNTER — Other Ambulatory Visit: Payer: Self-pay | Admitting: Internal Medicine

## 2021-11-16 DIAGNOSIS — M1990 Unspecified osteoarthritis, unspecified site: Secondary | ICD-10-CM

## 2021-11-16 MED ORDER — MELOXICAM 15 MG PO TABS: ORAL_TABLET | ORAL | 3 refills | Status: DC

## 2021-12-10 DIAGNOSIS — N200 Calculus of kidney: Secondary | ICD-10-CM | POA: Diagnosis not present

## 2021-12-12 ENCOUNTER — Encounter: Payer: Self-pay | Admitting: Internal Medicine

## 2021-12-14 ENCOUNTER — Encounter: Payer: Self-pay | Admitting: Internal Medicine

## 2021-12-19 ENCOUNTER — Encounter: Payer: Self-pay | Admitting: Internal Medicine

## 2021-12-21 ENCOUNTER — Encounter: Payer: Self-pay | Admitting: Internal Medicine

## 2022-01-08 ENCOUNTER — Encounter: Payer: Self-pay | Admitting: Gastroenterology

## 2022-02-15 ENCOUNTER — Encounter: Payer: Self-pay | Admitting: Internal Medicine

## 2022-02-15 ENCOUNTER — Encounter: Payer: Self-pay | Admitting: Nurse Practitioner

## 2022-02-15 ENCOUNTER — Ambulatory Visit (INDEPENDENT_AMBULATORY_CARE_PROVIDER_SITE_OTHER): Payer: Medicare HMO | Admitting: Nurse Practitioner

## 2022-02-15 VITALS — BP 108/76 | HR 87 | Temp 97.7°F | Ht 62.0 in | Wt 257.0 lb

## 2022-02-15 DIAGNOSIS — F3340 Major depressive disorder, recurrent, in remission, unspecified: Secondary | ICD-10-CM

## 2022-02-15 DIAGNOSIS — E538 Deficiency of other specified B group vitamins: Secondary | ICD-10-CM | POA: Diagnosis not present

## 2022-02-15 DIAGNOSIS — R7309 Other abnormal glucose: Secondary | ICD-10-CM

## 2022-02-15 DIAGNOSIS — N200 Calculus of kidney: Secondary | ICD-10-CM

## 2022-02-15 DIAGNOSIS — K219 Gastro-esophageal reflux disease without esophagitis: Secondary | ICD-10-CM | POA: Diagnosis not present

## 2022-02-15 DIAGNOSIS — Z79899 Other long term (current) drug therapy: Secondary | ICD-10-CM | POA: Diagnosis not present

## 2022-02-15 DIAGNOSIS — Z0000 Encounter for general adult medical examination without abnormal findings: Secondary | ICD-10-CM

## 2022-02-15 DIAGNOSIS — M1990 Unspecified osteoarthritis, unspecified site: Secondary | ICD-10-CM | POA: Diagnosis not present

## 2022-02-15 DIAGNOSIS — I10 Essential (primary) hypertension: Secondary | ICD-10-CM | POA: Diagnosis not present

## 2022-02-15 DIAGNOSIS — K760 Fatty (change of) liver, not elsewhere classified: Secondary | ICD-10-CM | POA: Diagnosis not present

## 2022-02-15 DIAGNOSIS — K449 Diaphragmatic hernia without obstruction or gangrene: Secondary | ICD-10-CM | POA: Diagnosis not present

## 2022-02-15 DIAGNOSIS — E782 Mixed hyperlipidemia: Secondary | ICD-10-CM | POA: Diagnosis not present

## 2022-02-15 DIAGNOSIS — R8271 Bacteriuria: Secondary | ICD-10-CM | POA: Diagnosis not present

## 2022-02-15 DIAGNOSIS — R69 Illness, unspecified: Secondary | ICD-10-CM | POA: Diagnosis not present

## 2022-02-15 MED ORDER — PHENTERMINE HCL 37.5 MG PO TABS: ORAL_TABLET | ORAL | 0 refills | Status: DC

## 2022-02-15 NOTE — Progress Notes (Signed)
ANNUAL WELLNESS VISIT AND FOLLOW UP  Assessment and Plan:  Annual Medicare Wellness Visit Due annually  Health maintenance reviewed  Morbid obesity ([place]) BMI 45 -  Start Phentermine as directed. Discussed appropriate BMI Goal of losing 1 lb per month. Diet modification. Physical activity. Encouraged/praised to build confidence.  Essential hypertension Controlled by lifestyle Discussed DASH (Dietary Approaches to Stop Hypertension) DASH diet is lower in sodium than a typical American diet. Cut back on foods that are high in saturated fat, cholesterol, and trans fats. Eat more whole-grain foods, fish, poultry, and nuts Remain active and exercise as tolerated daily.  Monitor BP at home-Call if greater than 130/80.  Check CMP/CBC   Hyperlipidemia, unspecified hyperlipidemia type Continue Rosuvastatin Discussed lifestyle modifications. Recommended diet heavy in fruits and veggies, omega 3's. Decrease consumption of animal meats, cheeses, and dairy products. Remain active and exercise as tolerated. Continue to monitor. Check lipids/TSH  Medication management All medications discussed and reviewed in full. All questions and concerns regarding medications addressed.    Vitamin D deficiency At goal at recent check; continue to recommend supplementation for goal of 60-100 Monitor levels  Abnormal glucose (hx of prediabetes) Education: Reviewed 'ABCs' of diabetes management  Discussed goals to be met and/or maintained include A1C (<7) Blood pressure (<130/80) Cholesterol (LDL <70) Continue Eye Exam yearly  Continue Dental Exam Q6 mo Discussed dietary recommendations Discussed Physical Activity recommendations Check A1C and insulin  Depression, major, in remission ([place]) In remission off of medications; monitor Lifestyle discussed: diet/exerise, sleep hygiene, stress management, hydration  Hiatal hernia/ GERD Continue PPI/H2 blocker, diet discussed Weight  loss  Fatty liver Weight loss advised, avoid alcohol/tylenol, will monitor LFTs  Osteoarthritis Follows with Dr. Marlou Sa, taking tylenol/meloxicam (limited due to organ functions). Discussed topicals - voltaren, aspercreme QID, can wrap after application. Icing PRN. Continue with weight loss efforts.  Pursue a combination of weight-bearing exercises and strength training. Advised on fall prevention measures including proper lighting in all rooms, removal of area rugs and floor clutter, use of walking devices as deemed appropriate, avoidance of uneven walking surfaces. Smoking cessation and moderate alcohol consumption if applicable Consume [id number] to 1000 IU of vitamin D daily with a goal vitamin D serum value of 30 ng/mL or higher. Aim for 1000 to 1200 mg of elemental calcium daily through supplements and/or dietary sources.  History of kidney stones [facility] following Stay well hydrated  Chronic bacteriuria/hematuria [facility] following  B12 deficiency Monitor levels  GERD No suspected reflux complications ([place]/stricture). Lifestyle modification:  wt loss, avoid meals 2-3h before bedtime. Consider eliminating food triggers:  chocolate, caffeine, EtOH, acid/spicy food.  Orders Placed This Encounter  Procedures   CBC with Differential/Platelet   COMPLETE METABOLIC PANEL WITH GFR   Lipid panel   Hemoglobin A1c   Insulin, random   Vitamin B12   Meds ordered this encounter  Medications   phentermine (ADIPEX-P) 37.5 MG tablet    Sig: Take 1/2 to 1 tablet every Morning for Dieting & Weight Loss    Dispense:  30 tablet    Refill:  0    Order Specific Question:   Supervising Provider    Answer:   [name] [phone]    Notify office for further evaluation and treatment, questions or concerns if any reported s/s fail to improve.   The patient was advised to call back or seek an in-person evaluation if any symptoms worsen or if the condition fails to improve as  anticipated.   Further disposition  pending results of labs. Discussed med's effects and SE's.    I discussed the assessment and treatment plan with the patient. The patient was provided an opportunity to ask questions and all were answered. The patient agreed with the plan and demonstrated an understanding of the instructions.  Discussed med's effects and SE's. Screening labs and tests as requested with regular follow-up as recommended.  I provided 35 minutes of face-to-face time during this encounter including counseling, chart review, and critical decision making was preformed.   Future Appointments  Date Time Provider [place]  07/12/2022  9:00 AM Darrol Jump, NP [facility] None  01/13/2023 11:00 AM Darrol Jump, NP [facility] None    Plan:   During the course of the visit the patient was educated and counseled about appropriate screening and preventive services including:   Pneumococcal vaccine  Prevnar 13 Influenza vaccine Td vaccine Screening electrocardiogram Bone densitometry screening Colorectal cancer screening Diabetes screening Glaucoma screening Nutrition counseling  Advanced directives: requested  HPI  67 y.o. female  presents for AWV and follow up. She has Hypertension; Hyperlipidemia; GERD (gastroesophageal reflux disease); Hiatal hernia; Morbid obesity ([place]); Arthritis of hip; Arthritis of shoulder region, right, degenerative; Medication management; Vitamin D deficiency; Abnormal glucose; Osteoarthritis; Recurrent major depressive disorder, in remission ([place]); B12 deficiency; CKD (chronic kidney disease) stage 3, GFR 30-59 ml/min ([place]); Bilateral knee pain; Fatty liver; Hematuria; Bacteriuria, chronic; History of kidney stones; and Dysphagia on their problem list.  Overall she reports feeling well.  She has no new concerns in clinic today.  She has had hip and shoulder replacement with Dr. Marlou Sa, doing well. Does have bil knee pain, arthritis,  currently managing with meloxicam 15 mg every 3 days a week, tylenol PM at night. She supplements with topical aspercreme.   She has hx of MDD, recently remains in remission off of medication.   She has chronic bacteriuria and recently with microscopic hematuria, kidney stones, underwent cysto/laser/ureteral stent placement in Nov 2022 with Dr. Jacalyn Lefevre. Oxybutynin is beneficial for frequency.   BMI is Body mass index is 47.01 kg/m., she has been working on diet since the holiday season.  Admits to not eating well with increase carbs and sugars over the last several months, however, recently she has started to eat more veggies and baked chicken.  She is willing to move forward with weight loss.    Wt Readings from Last 3 Encounters:  02/15/22 257 lb (116.6 kg)  11/12/21 258 lb 3.2 oz (117.1 kg)  10/20/21 257 lb 15 oz (117 kg)   Her blood pressure has been controlled at home, today their BP is BP: 108/76.   She does workout. She denies chest pain, shortness of breath, dizziness.   She is on cholesterol medication, she is taking crestor 20 mg daily, heart disease brother died 71, mother with MI in her 38's. Her cholesterol is at goal. The cholesterol last visit was:  Lab Results  Component Value Date   CHOL 122 11/12/2021   HDL 44 (L) 11/12/2021   LDLCALC 54 11/12/2021   TRIG 162 (H) 11/12/2021   CHOLHDL 2.8 11/12/2021  . She has been working on diet and exercise for hx of prediabetes (A1C 5.9% in 2016), she is not on bASA, she is not on ACE/ARB and denies foot ulcerations, hyperglycemia, hypoglycemia , increased appetite, nausea, paresthesia of the feet, polydipsia, polyuria, visual disturbances, vomiting and weight loss. Last A1C in the office was:  Lab Results  Component Value Date   HGBA1C  5.7 (H) 11/12/2021    Last GFR:  Lab Results  Component Value Date   EGFR 54 (L) 11/12/2021   Patient is on Vitamin D supplement,10,000.  Lab Results  Component Value Date   VD25OH 20  11/12/2021     She has reduced B12 supplement dose, has 2500 mcg daily Lab Results  Component Value Date   VITAMINB12 1,442 (H) 07/09/2021    Current Medications:    Current Outpatient Medications (Cardiovascular):    rosuvastatin (CRESTOR) 20 MG tablet, TAKE 1 TABLET DAILY FOR    CHOLESTEROL   valsartan (DIOVAN) 320 MG tablet, Take 1 tablet (320 mg total) by mouth daily.  Current Outpatient Medications (Respiratory):    montelukast (SINGULAIR) 10 MG tablet, TAKE 1 TABLET AT BEDTIME (Patient taking differently: Take 10 mg by mouth at bedtime.)  Current Outpatient Medications (Analgesics):    meloxicam (MOBIC) 15 MG tablet, TAKE 1/2 TO 1 TABLET DAILY AS NEEDED WITH FOOD FOR PAIN AND INFLAMMATION   traMADol (ULTRAM) 50 MG tablet, Take 1 tablet (50 mg total) by mouth every 6 (six) hours as needed.  Current Outpatient Medications (Hematological):    Cyanocobalamin (B-12) 2500 MCG SUBL, Place 1 tablet under the tongue once a week.  Current Outpatient Medications (Other):    Cholecalciferol (VITAMIN D) 125 MCG (5000 UT) CAPS, Take 10,000 Units by mouth daily.   cyclobenzaprine (FLEXERIL) 10 MG tablet, TAKE 1/2 1 TABLET BY MOUTH THREE TIMES A DAY AS NEEDED FOR MUSCLE SPASMS   diphenhydramine-acetaminophen (TYLENOL PM) 25-500 MG TABS, Take 2 tablets by mouth at bedtime.   magnesium gluconate (MAGONATE) 500 MG tablet, Take 500 mg by mouth daily.   oxybutynin (DITROPAN) 5 MG tablet, TAKE 1 TABLET 3 TIMES A DAY (Patient taking differently: Take 10 mg by mouth daily.)   pantoprazole (PROTONIX) 40 MG tablet, TAKE 1 TABLET BY MOUTH EVERY DAY   vitamin E 180 MG (400 UNITS) capsule, Take 1 capsule (400 Units total) by mouth daily. (Patient not taking: Reported on 02/15/2022)  Health Maintenance:   Immunization History  Administered Date(s) Administered   Influenza Inj Mdck Quad Pf 11/02/2016, 10/23/2019   Influenza Inj Mdck Quad With Preservative 10/23/2018   Influenza, Quadrivalent,  Recombinant, Inj, Pf 10/06/2017   Influenza,inj,Quad PF,6+ Mos 10/14/2020, 11/12/2021   Influenza-Unspecified 11/22/2014, 10/31/2015, 10/06/2017   PFIZER(Purple Top)SARS-COV-2 Vaccination 04/30/2019, 05/21/2019, 11/23/2019   PPD Test 06/01/2013   Pneumococcal Polysaccharide-23 06/08/2018   Tdap 03/22/2017   Health Maintenance  Topic Date Due   Zoster Vaccines- Shingrix (1 of 2) Never done   COVID-19 Vaccine (4 - 2023-24 season) 09/04/2021   Medicare Annual Wellness (AWV)  02/16/2023   MAMMOGRAM  08/06/2023   COLONOSCOPY (Pts 45-61yr Insurance coverage will need to be confirmed)  09/22/2025   PAP SMEAR-Modifier  07/10/2026   DTaP/Tdap/Td (2 - Td or Tdap) 03/23/2027   INFLUENZA VACCINE  Completed   Hepatitis C Screening  Completed   HIV Screening  Completed   HPV VACCINES  Aged Out   Prevnar 20 at 630Shingrix: check with insurance, plans to get this year Covid 19: 3/3, pfizer, declines booster  Colonoscopy 09/22/2020- Dr. PHilarie Fredrickson no polyps, diverticula, 5 year recall due to family hx  MMG: 08/2021 Due 08/2022 - Ultrasound and Biopsy completed 08/2021  DEXA 05/2013 T 0.1 Due age [id number] 2019 normal, doing here, neg HPV q 5 years, - plan for 2024 CPE  Last eye: Dr. BDeon Pilling glasses, last 05/2021, goes annually Last dental: overdue, last 3-4 years, Dr.  Blackwell retired, needs to schedule with Dr. Altamese [place]   Patient Care Team: [name], MD as PCP - General (Internal Medicine) Marlou Sa, Tonna Corner, MD as Consulting Physician (Orthopedic Surgery) Rolm Bookbinder, MD as Consulting Physician (Dermatology) Pyrtle, Lajuan Lines, MD as Consulting Physician (Gastroenterology)  Medical History:  Past Medical History:  Diagnosis Date   Allergy    Anemia    Arthritis    Asthma    as child but out grew   Cataract    small forming   Diverticula of colon    Environmental allergies    GERD (gastroesophageal reflux disease)    History of kidney stones    Hyperlipidemia    down on meds    Hypertension    Kidney stones 2008   again 8- 2022   Obesity, unspecified    PONV (postoperative nausea and vomiting)    UTI (urinary tract infection)    Vertigo     Allergies Allergies  Allergen Reactions   Keflex [Cephalexin] Anaphylaxis and Swelling   Codeine Nausea And Vomiting   Wellbutrin [Bupropion] Diarrhea    SURGICAL HISTORY She  has a past surgical history that includes Cholecystectomy (1987); Esophageal dilation; Knee arthroscopy (Right, 2007); Total hip arthroplasty (Right, 03/19/2014); Reverse shoulder arthroplasty (Right, 06/04/2014); Breast biopsy (Left); Upper gastrointestinal endoscopy; Colonoscopy; Eye surgery; Wisdom tooth extraction; Cystoscopy with retrograde pyelogram, ureteroscopy and stent placement (Right, 10/28/2020); Holmium laser application (Right, Q000111Q); Cystoscopy/ureteroscopy/holmium laser/stent placement (Bilateral, 11/18/2020); Cystoscopy with retrograde pyelogram, ureteroscopy and stent placement (Bilateral, 10/20/2021); and Holmium laser application (Bilateral, 10/20/2021). FAMILY HISTORY Her family history includes Cancer in her father; Colon cancer in her mother; Esophageal cancer in her father; Heart disease in her brother and mother; Hyperlipidemia in her mother; Hypertension in her mother; Pulmonary embolism in her brother; Stroke in her mother; Throat cancer in her father. SOCIAL HISTORY She  reports that she has never smoked. She has never used smokeless tobacco. She reports that she does not drink alcohol and does not use drugs.  MEDICARE WELLNESS OBJECTIVES: Physical activity:   Cardiac risk factors:   Depression/mood screen:      02/15/2022   10:33 AM  Depression screen PHQ 2/9  Decreased Interest 0  Down, Depressed, Hopeless 0  PHQ - 2 Score 0    ADLs:     02/15/2022   10:31 AM 09/30/2021    9:07 AM  In your present state of health, do you have any difficulty performing the following activities:  Hearing? 0   Vision? 0    Difficulty concentrating or making decisions? 0   Walking or climbing stairs? 0   Dressing or bathing? 0   Doing errands, shopping? 0 0  Preparing Food and eating ? N   Using the Toilet? N   In the past six months, have you accidently leaked urine? N   Do you have problems with loss of bowel control? N   Managing your Medications? N   Managing your Finances? N   Housekeeping or managing your Housekeeping? N      Cognitive Testing  Alert? Yes  Normal Appearance?Yes  Oriented to person? Yes  Place? Yes   Time? Yes  Recall of three objects?  Yes  Can perform simple calculations? Yes  Displays appropriate judgment?Yes  Can read the correct time from a watch face?Yes  EOL planning:        Review of Systems: Review of Systems  Constitutional:  Negative for chills, fever, malaise/fatigue and weight loss.  HENT:  Negative for congestion, ear discharge, ear pain, hearing loss, sore throat and tinnitus.   Eyes: Negative.  Negative for blurred vision and double vision.  Respiratory:  Negative for cough, sputum production, shortness of breath and wheezing.   Cardiovascular:  Negative for chest pain, palpitations, orthopnea, claudication, leg swelling and PND.  Gastrointestinal:  Negative for abdominal pain, blood in stool, constipation, diarrhea, heartburn, melena, nausea and vomiting.  Genitourinary: Negative.  Negative for dysuria, flank pain, frequency, hematuria and urgency.  Musculoskeletal:  Positive for joint pain (knee pain). Negative for falls and myalgias.  Skin: Negative.  Negative for rash.  Neurological:  Negative for dizziness, tingling, sensory change, loss of consciousness, weakness and headaches.  Endo/Heme/Allergies:  Negative for polydipsia.  Psychiatric/Behavioral: Negative.  Negative for depression, hallucinations, memory loss, substance abuse and suicidal ideas. The patient is not nervous/anxious and does not have insomnia.   All other systems reviewed and are  negative.   Physical Exam: Estimated body mass index is 47.01 kg/m as calculated from the following:   Height as of this encounter: 5' 2"$  (1.575 m).   Weight as of this encounter: 257 lb (116.6 kg). BP 108/76   Pulse 87   Temp 97.7 F (36.5 C)   Ht 5' 2"$  (1.575 m)   Wt 257 lb (116.6 kg)   LMP 10/18/2013   SpO2 99%   BMI 47.01 kg/m   General Appearance: Well nourished well developed, in no apparent distress.  Eyes: PERRLA, EOMs, conjunctiva no swelling or erythema ENT/Mouth: Ear canals normal without obstruction, swelling, erythema, or discharge.  TMs normal bilaterally with no erythema, bulging, retraction, or loss of landmark.  Neck: Supple, thyroid normal, No bruits.  No cervical adenopathy Respiratory: Respiratory effort normal, Breath sounds clear A&P without wheeze, rhonchi, rales.   Cardio: RRR without murmurs, rubs or gallops. Brisk peripheral pulses without edema.  Chest: symmetric, with normal excursions Breasts: defer - no concerns, has upcoming mammogram  Abdomen: Morbidly obese,  Soft, nontender, no guarding, rebound, hernias, masses, or organomegaly.  Lymphatics: Non tender without lymphadenopathy.  Genitourinary: defer Musculoskeletal: No obvious deformity, ROM limited by body habitus, 4/5 strength, and slow antalgic gait.  Skin: Warm, dry without rashes, lesions, ecchymosis. Neuro: Awake and oriented X 3, Cranial nerves intact, reflexes equal bilaterally. Normal muscle tone, no cerebellar symptoms. Sensation intact.  Psych:  normal affect, Insight and Judgment appropriate.      Medicare Attestation I have personally reviewed: The patient's medical and social history Their use of alcohol, tobacco or illicit drugs Their current medications and supplements The patient's functional ability including ADLs,fall risks, home safety risks, cognitive, and hearing and visual impairment Diet and physical activities Evidence for depression or mood disorders  The  patient's weight, height, BMI, and visual acuity have been recorded in the chart.  I have made referrals, counseling, and provided education to the patient based on review of the above and I have provided the patient with a written personalized care plan for preventive services.    Christine [name] 10:40 AM [facility]

## 2022-02-15 NOTE — Patient Instructions (Signed)
Phentermine Capsules or Tablets What is this medication? PHENTERMINE (FEN ter meen) promotes weight loss. It works by decreasing appetite. It is often used for a short period of time. Changes to diet and exercise are often combined with this medication. This medicine may be used for other purposes; ask your health care provider or pharmacist if you have questions. COMMON BRAND NAME(S): Adipex-P, Atti-Plex P, Atti-Plex P Spansule, Fastin, Lomaira, Pro-Fast, Pro-Fast HS, Pro-Fast SA, Tara-8 What should I tell my care team before I take this medication? They need to know if you have any of these conditions: Agitation or nervousness Diabetes Glaucoma Heart disease High blood pressure History of substance use disorder History of stroke Kidney disease Lung disease called Primary Pulmonary Hypertension (PPH) Taken an MAOI, such as Carbex, Eldepryl, Marplan, Nardil, or Parnate in last 14 days Taking stimulant medications for attention disorders, weight loss, or to stay awake Thyroid disease An unusual or allergic reaction to phentermine, other medications, foods, dyes, or preservatives Pregnant or trying to get pregnant Breastfeeding How should I use this medication? Take this medication by mouth with a glass of water. Follow the directions on the prescription label. Take your medication at regular intervals. Do not take it more often than directed. Do not stop taking except on your care team's advice. Talk to your care team about the use of this medication in children. While this medication may be prescribed for children 17 years or older for selected conditions, precautions do apply. Overdosage: If you think you have taken too much of this medicine contact a poison control center or emergency room at once. NOTE: This medicine is only for you. Do not share this medicine with others. What if I miss a dose? If you miss a dose, take it as soon as you can. If it is almost time for your next dose,  take only that dose. Do not take double or extra doses. What may interact with this medication? Do not take this medication with any of the following: MAOIs, such as Carbex, Eldepryl, Marplan, Nardil, and Parnate This medication may also interact with the following: Alcohol Certain medications for depression, anxiety, or other mental health conditions Certain medications for blood pressure Linezolid Medications for colds or breathing difficulties, such as pseudoephedrine or phenylephrine Medications for diabetes Sibutramine Stimulant medications for ADHD, weight loss, or staying awake This list may not describe all possible interactions. Give your health care provider a list of all the medicines, herbs, non-prescription drugs, or dietary supplements you use. Also tell them if you smoke, drink alcohol, or use illegal drugs. Some items may interact with your medicine. What should I watch for while using this medication? Visit your care team for regular checks on your progress. Do not stop taking except on your care team's advice. You may develop a severe reaction. Your care team will tell you how much medication to take. Do not take this medication close to bedtime. It may prevent you from sleeping. This medication may affect your coordination, reaction time, or judgment. Do not drive or operate machinery until you know how this medication affects you. Sit up or stand slowly to reduce the risk of dizzy or fainting spells. Drinking alcohol with this medication can increase the risk of these side effects. This medication may affect blood sugar levels. Ask your care team if changes in diet or medications are needed if you have diabetes. Inform your care team if you wish to become pregnant or think you might be pregnant. Losing  weight while pregnant is not advised and may cause harm to the unborn child. Talk to your care team for more information. What side effects may I notice from receiving this  medication? Side effects that you should report to your care team as soon as possible: Allergic reactions--skin rash, itching, hives, swelling of the face, lips, tongue, or throat Heart valve disease--shortness of breath, chest pain, unusual weakness or fatigue, dizziness, feeling faint or lightheaded, fever, sudden weight gain, fast or irregular heartbeat Pulmonary hypertension--shortness of breath, chest pain, fast or irregular heartbeat, feeling faint or lightheaded, fatigue, swelling of the ankles or feet Side effects that usually do not require medical attention (report to your care team if they continue or are bothersome): Change in taste Diarrhea Dizziness Dry mouth Restlessness Trouble sleeping This list may not describe all possible side effects. Call your doctor for medical advice about side effects. You may report side effects to FDA at 1-800-FDA-1088. Where should I keep my medication? Keep out of the reach of children. This medication can be abused. Keep your medication in a safe place to protect it from theft. Do not share this medication with anyone. Selling or giving away this medication is dangerous and against the law. This medication may cause harm and death if it is taken by other adults, children, or pets. Return medication that has not been used to an official disposal site. Contact the DEA at 980-010-9650 or your city/county government to find a site. If you cannot return the medication, mix any unused medication with a substance like cat litter or coffee grounds. Then throw the medication away in a sealed container like a sealed bag or coffee can with a lid. Do not use the medication after the expiration date. Store at room temperature between 20 and 25 degrees C (68 and 77 degrees F). Keep container tightly closed. NOTE: This sheet is a summary. It may not cover all possible information. If you have questions about this medicine, talk to your doctor, pharmacist, or health  care provider.  2023 Elsevier/Gold Standard (2020-04-07 00:00:00)

## 2022-02-16 ENCOUNTER — Encounter: Payer: Self-pay | Admitting: Internal Medicine

## 2022-02-16 LAB — LIPID PANEL
Cholesterol: 119 mg/dL (ref <200)
HDL: 43 mg/dL — ABNORMAL LOW (ref >=50)
LDL Cholesterol (Calc): 52 mg/dL (calc)
Non-HDL Cholesterol (Calc): 76 mg/dL (calc) (ref <130)
Total CHOL/HDL Ratio: 2.8 (calc) (ref <5.0)
Triglycerides: 154 mg/dL — ABNORMAL HIGH (ref <150)

## 2022-02-16 LAB — COMPLETE METABOLIC PANEL WITH GFR
AG Ratio: 1.4 (calc) (ref 1.0–2.5)
ALT: 20 U/L (ref 6–29)
AST: 21 U/L (ref 10–35)
Albumin: 4.6 g/dL (ref 3.6–5.1)
Alkaline phosphatase (APISO): 54 U/L (ref 37–153)
BUN: 21 mg/dL (ref 7–25)
CO2: 24 mmol/L (ref 20–32)
Calcium: 10 mg/dL (ref 8.6–10.4)
Chloride: 104 mmol/L (ref 98–110)
Creat: 1.05 mg/dL (ref 0.50–1.05)
Globulin: 3.2 g/dL (calc) (ref 1.9–3.7)
Glucose, Bld: 100 mg/dL — ABNORMAL HIGH (ref 65–99)
Potassium: 4.6 mmol/L (ref 3.5–5.3)
Sodium: 140 mmol/L (ref 135–146)
Total Bilirubin: 0.6 mg/dL (ref 0.2–1.2)
Total Protein: 7.8 g/dL (ref 6.1–8.1)
eGFR: 59 mL/min/{1.73_m2} — ABNORMAL LOW (ref >=60)

## 2022-02-16 LAB — CBC WITH DIFFERENTIAL/PLATELET
Absolute Monocytes: 620 cells/uL (ref 200–950)
Basophils Absolute: 41 cells/uL (ref 0–200)
Basophils Relative: 0.7 %
Eosinophils Absolute: 460 cells/uL (ref 15–500)
Eosinophils Relative: 7.8 %
HCT: 42.5 % (ref 35.0–45.0)
Hemoglobin: 14.5 g/dL (ref 11.7–15.5)
Lymphs Abs: 1481 cells/uL (ref 850–3900)
MCH: 31.7 pg (ref 27.0–33.0)
MCHC: 34.1 g/dL (ref 32.0–36.0)
MCV: 92.8 fL (ref 80.0–100.0)
MPV: 10.6 fL (ref 7.5–12.5)
Monocytes Relative: 10.5 %
Neutro Abs: 3298 cells/uL (ref 1500–7800)
Neutrophils Relative %: 55.9 %
Platelets: 224 10*3/uL (ref 140–400)
RBC: 4.58 10*6/uL (ref 3.80–5.10)
RDW: 13.1 % (ref 11.0–15.0)
Total Lymphocyte: 25.1 %
WBC: 5.9 10*3/uL (ref 3.8–10.8)

## 2022-02-16 LAB — HEMOGLOBIN A1C
Hgb A1c MFr Bld: 5.4 % of total Hgb (ref <5.7)
Mean Plasma Glucose: 108 mg/dL
eAG (mmol/L): 6 mmol/L

## 2022-02-16 LAB — VITAMIN B12: Vitamin B-12: >2000 pg/mL — ABNORMAL HIGH (ref 200–1100)

## 2022-02-16 LAB — INSULIN, RANDOM: Insulin: 45.8 u[IU]/mL — ABNORMAL HIGH

## 2022-03-01 ENCOUNTER — Encounter: Payer: Self-pay | Admitting: Internal Medicine

## 2022-03-06 ENCOUNTER — Encounter: Payer: Self-pay | Admitting: Internal Medicine

## 2022-03-12 ENCOUNTER — Encounter: Payer: Self-pay | Admitting: Internal Medicine

## 2022-03-12 MED ORDER — PHENTERMINE HCL 37.5 MG PO TABS: ORAL_TABLET | ORAL | 0 refills | Status: DC

## 2022-03-12 MED ORDER — MONTELUKAST SODIUM 10 MG PO TABS: 10.0000 mg | ORAL_TABLET | Freq: Every day | ORAL | 3 refills | Status: AC

## 2022-03-19 ENCOUNTER — Encounter: Payer: Self-pay | Admitting: Internal Medicine

## 2022-03-20 ENCOUNTER — Encounter: Payer: Self-pay | Admitting: Internal Medicine

## 2022-03-20 ENCOUNTER — Other Ambulatory Visit: Payer: Self-pay | Admitting: Internal Medicine

## 2022-03-20 MED ORDER — PHENTERMINE HCL 37.5 MG PO TABS: ORAL_TABLET | ORAL | 1 refills | Status: AC

## 2022-03-25 DIAGNOSIS — R8271 Bacteriuria: Secondary | ICD-10-CM | POA: Diagnosis not present

## 2022-03-25 DIAGNOSIS — N200 Calculus of kidney: Secondary | ICD-10-CM | POA: Diagnosis not present

## 2022-04-01 ENCOUNTER — Ambulatory Visit: Payer: Medicare HMO | Admitting: Nurse Practitioner

## 2022-04-08 DIAGNOSIS — N3000 Acute cystitis without hematuria: Secondary | ICD-10-CM | POA: Diagnosis not present

## 2022-04-08 DIAGNOSIS — N200 Calculus of kidney: Secondary | ICD-10-CM | POA: Diagnosis not present

## 2022-04-21 ENCOUNTER — Encounter: Payer: Self-pay | Admitting: Internal Medicine

## 2022-04-22 ENCOUNTER — Encounter: Payer: Self-pay | Admitting: Internal Medicine

## 2022-05-14 ENCOUNTER — Other Ambulatory Visit: Payer: Self-pay | Admitting: Physician Assistant

## 2022-05-20 ENCOUNTER — Encounter: Payer: Self-pay | Admitting: Internal Medicine

## 2022-05-20 ENCOUNTER — Ambulatory Visit: Payer: Medicare HMO | Admitting: Internal Medicine

## 2022-05-20 NOTE — Progress Notes (Signed)
R  E -  S  C  H  E  D  U  L  E D   Due to Power Outage                                                                                                                                                                                         Future Appointments  Date Time Provider Department  02/15/2022              had  Wellness  [place], Archie Patten   05/20/2022                  3 mo ov 11:30 AM Lucky Cowboy, MD [facility]  08/23/2022               cpe  9:00 AM Adela Glimpse, NP [facility]  01/13/2023                Wellness 11:00 AM Adela Glimpse, NP [facility]      History of Present Illness:       This very nice 67 y.o. single WF  presents for 6 month follow up with HTN, HLD, Morbid Obesity & fatty Liver,  Pre-Diabetes and Vitamin D Deficiency. Patient also has GERD controlled on her meds.         Last year , patient had Cystoscopy, Retrograde Pyelogram, Ureteroscopy  & Stent placement for kidney stones by Dr Estrellita Ludwig.             Note patient has Morbid Obesity ( 256 # /BMI 46.82  in July 2023) which she attributes to dietary indiscretions.         Patient is treated for HTN (2000 ) & BP has been controlled at home. Today's BP is at goal  -                          .  Patient has had no complaints of any cardiac type chest pain, palpitations, dyspnea Pollyann Kennedy /PND, dizziness, claudication or dependent edema.        Hyperlipidemia is controlled with diet & Rosuvastatin. Patient denies myalgias or other med SE's. Last Lipids were at goal except elevated Trig's:  Lab Results  Component Value Date   CHOL 119 02/15/2022   HDL 43 (L) 02/15/2022   LDLCALC 52 02/15/2022   TRIG 154 (H) 02/15/2022   CHOLHDL 2.8 02/15/2022     Also, the patient has Morbid Obesity (BMI 47+) and   PreDiabetes (A1c 5.7% /2015 & 5.9% /2016)  and has had no symptoms  of reactive hypoglycemia, diabetic polys, paresthesias or visual blurring.  Last A1c was normal & at goal:  Lab Results  Component Value Date   HGBA1C 5.4 02/15/2022   Wt Readings from Last 3 Encounters:  02/15/22 257 lb (116.6 kg)  11/12/21 258 lb 3.2 oz (117.1 kg)  10/20/21 257 lb 15 oz (117 kg)                                                       Further, the patient also has history of Vitamin D Deficiency ("11"/2008) and supplements vitamin D without any suspected side-effects. Last vitamin D was at goal:  Lab Results  Component Value Date   VD25OH 50 11/12/2021       Current Outpatient Medications  Medication Instructions   B-12    2500 mcg SL 1 tablet  weekly   cyclobenzaprine 10 MG tablet TAKE 1/2 to 1 TABLET  3 x /day prn    diphenhydramine-Apap  25-500 MG TABS 2 tablets  Daily at bedtime   magnesium    500 mg Daily   Meloxicam    15 MG tablet TAKE 1/2 TO 1 TABLET DAILY AS NEEDED    montelukast  10 MG tablet TAKE 1 TABLET AT BEDTIME   oxybutynin   5 MG tablet TAKE 1 TABLET 3 TIMES A DAY   Pantoprazole 40 mg Daily   rosuvastatin  20 MG tablet TAKE 1 TABLET DAILY   traMADol    50 mg Every 6 hours PRN   Valsartan     320 mg Daily   Vitamin D  10,000 Units Daily   vitamin E    400 Units Daily     Allergies  Allergen Reactions   Keflex [Cephalexin] Anaphylaxis and Swelling   Codeine     REACTION: nausea/vomiting   Wellbutrin [Bupropion] Diarrhea     PMHx:   Past Medical History:  Diagnosis Date   Allergy    Arthritis    Asthma    as child but out grew   Cataract    small forming   Environmental allergies    GERD (gastroesophageal reflux disease)    Headache    Hiatal hernia    with stricture dilation   Hyperlipidemia    down on meds   Hypertension    Kidney stones 2008   again 8- 2022   Obesity    PONV (postoperative nausea and vomiting)    UTI (urinary tract infection)       Immunization History  Administered Date(s) Administered   Influenza Inj Mdck Quad  11/02/2016, 10/23/2019   Influenza Inj Mdck Quad  10/23/2018   Influenza, Quadrivalent 10/06/2017   Influenza 11/22/2014, 10/31/2015, 10/06/2017   PFIZER SARS-COV-2 Vacc 04/30/2019, 05/21/2019, 11/23/2019   PPD Test 06/01/2013   Pneumococcal-23 06/08/2018   Tdap 03/22/2017     Past Surgical History:  Procedure Laterality Date   BREAST BIOPSY Left    benign   CHOLECYSTECTOMY  1987   COLONOSCOPY     ESOPHAGEAL DILATION     EYE SURGERY     to remove film over eyes   KNEE ARTHROSCOPY Right 2007   REVERSE SHOULDER ARTHROPLASTY Right 06/04/2014   REVERSE SHOULDER ARTHROPLASTY; Cammy Copa, MD   TOTAL HIP ARTHROPLASTY Right 03/19/2014   TOTAL HIP ARTHROPLASTY ANTERIOR APPROACH; Corrie Mckusick  August Saucer, MD   UPPER GASTROINTESTINAL ENDOSCOPY      FHx:    Reviewed / unchanged  SHx:    Reviewed / unchanged   Systems Review:  Constitutional: Denies fever, chills, wt changes, headaches, insomnia, fatigue, night sweats, change in appetite. Eyes: Denies redness, blurred vision, diplopia, discharge, itchy, watery eyes.  ENT: Denies discharge, congestion, post nasal drip, epistaxis, sore throat, earache, hearing loss, dental pain, tinnitus, vertigo, sinus pain, snoring.  CV: Denies chest pain, palpitations, irregular heartbeat, syncope, dyspnea, diaphoresis, orthopnea, PND, claudication or edema. Respiratory: denies cough, dyspnea, DOE, pleurisy, hoarseness, laryngitis, wheezing.  Gastrointestinal: Denies dysphagia, odynophagia, heartburn, reflux, water brash, abdominal pain or cramps, nausea, vomiting, bloating, diarrhea, constipation, hematemesis, melena, hematochezia  or hemorrhoids. Genitourinary: Denies dysuria, frequency, urgency, nocturia, hesitancy, discharge, hematuria or flank pain. Musculoskeletal: Denies arthralgias, myalgias, stiffness, jt. swelling, pain, limping or strain/sprain.   Skin: Denies pruritus, rash, hives, warts, acne, eczema or change in skin lesion(s). Neuro: No weakness, tremor, incoordination, spasms, paresthesia or pain. Psychiatric: Denies confusion, memory loss or sensory loss. Endo: Denies change in weight, skin or hair change.  Heme/Lymph: No excessive bleeding, bruising or enlarged lymph nodes.  Physical Exam  LMP 10/18/2013   Appears  over  nourished  and in no distress.  Eyes: PERRLA, EOMs, conjunctiva no swelling or erythema. Sinuses: No frontal/maxillary tenderness ENT/Mouth: EAC's clear, TM's nl w/o erythema, bulging. Nares clear w/o erythema, swelling, exudates. Oropharynx clear without erythema or exudates. Oral hygiene is good. Tongue normal, non obstructing. Hearing intact.  Neck: Supple. Thyroid not palpable. Car 2+/2+ without bruits, nodes or JVD. Chest: Respirations nl with BS clear & equal w/o rales, rhonchi, wheezing or stridor.  Cor: Heart sounds normal w/ regular rate and rhythm without sig. murmurs, gallops, clicks or rubs. Peripheral pulses normal and equal  without edema.  Abdomen: Soft & bowel sounds normal. Non-tender w/o guarding, rebound, hernias, masses or organomegaly.  Lymphatics: Unremarkable.  Musculoskeletal: Full ROM all peripheral extremities, joint stability, 5/5 strength and normal gait.  Skin: Warm, dry without exposed rashes, lesions or ecchymosis apparent.  Neuro: Cranial nerves intact, reflexes equal bilaterally. Sensory-motor testing grossly intact. Tendon reflexes grossly intact.  Pysch: Alert & oriented x 3.  Insight and judgement nl & appropriate. No ideations.  Assessment and Plan:   1. Essential hypertension  - CBC with Differential/Platelet - COMPLETE METABOLIC PANEL WITH GFR - Magnesium - TSH   2. Hyperlipidemia, mixed  - Lipid panel - TSH   3. Abnormal glucose  - Hemoglobin A1c - Insulin, random  4. Vitamin D deficiency  - VITAMIN D 25 Hydroxy    5. Fatty liver  - COMPLETE  METABOLIC PANEL WITH GFR   6. Morbid obesity with BMI of 45.0-49.9, adult (HCC)  - COMPLETE METABOLIC PANEL WITH GFR - TSH - Hemoglobin A1c - Insulin, random   7. Medication management  - CBC with Differential/Platelet - COMPLETE METABOLIC PANEL WITH GFR - Magnesium - Lipid panel - TSH - Hemoglobin A1c - Insulin, random - VITAMIN D 25 Hydroxy          Discussed  regular exercise, BP monitoring, weight control to achieve/maintain BMI less than 25 and discussed med and SE's. Recommended labs to assess and monitor clinical status with further disposition pending results of labs.  I discussed the assessment and treatment plan with the patient. The patient was provided an opportunity to ask questions and all were answered. The patient agreed with the plan and demonstrated an understanding of the instructions.  I provided over 30 minutes of exam, counseling, chart review and  complex critical decision making.        The patient was advised to call back or seek an in-person evaluation if the symptoms worsen or if the condition fails to improve as anticipated.   Marinus Maw, MD

## 2022-05-24 NOTE — Progress Notes (Unsigned)
Future Appointments  Date Time Provider Department  05/25/2022                     6 mo   3:00 PM Lucky Cowboy, MD [facility]  08/23/2022                    cpe  9:00 AM Adela Glimpse, NP [facility]  01/13/2023                      wellness 11:00 AM Adela Glimpse, NP [facility]    History of Present Illness:       This very nice 67 y.o. single WF  presents for 6 month follow up with HTN, HLD, Morbid Obesity & fatty Liver,  Pre-Diabetes and Vitamin D Deficiency. Patient also has GERD controlled on her meds.         Last year , patient had Cystoscopy, Retrograde Pyelogram, Ureteroscopy  & Stent placement for kidney stones by Dr Estrellita Ludwig.             Note patient has Morbid Obesity ( 256 # /BMI 46.82  in July 2023) which she attributes to dietary indiscretions.         Patient is treated for HTN (2000 ) & BP has been controlled at home. Today's BP is at goal  -                          .  Patient has had no complaints of any cardiac type chest pain, palpitations, dyspnea Pollyann Kennedy /PND, dizziness, claudication or dependent edema.        Hyperlipidemia is controlled with diet & Rosuvastatin. Patient denies myalgias or other med SE's. Last Lipids were at goal except elevated Trig's:  Lab Results  Component Value Date   CHOL 119 02/15/2022   HDL 43 (L) 02/15/2022   LDLCALC 52 02/15/2022   TRIG 154 (H) 02/15/2022   CHOLHDL 2.8 02/15/2022     Also, the patient has Morbid Obesity (BMI 47+) and  PreDiabetes (A1c 5.7% /2015 & 5.9% /2016)  and has had no symptoms of reactive hypoglycemia, diabetic polys, paresthesias or visual blurring.  Last A1c was normal & at goal:  Lab Results  Component Value Date   HGBA1C 5.4 02/15/2022   Wt Readings from Last 3 Encounters:  05/25/22 243 lb 12.8 oz (110.6 kg)  02/15/22 257 lb (116.6 kg)  11/12/21 258 lb 3.2 oz (117.1 kg)                                                       Further, the patient also has history of  Vitamin D Deficiency ("11"/2008) and supplements vitamin D without any suspected side-effects. Last vitamin D was at goal:  Lab Results  Component Value Date   VD25OH 50 11/12/2021       Current Outpatient Medications  Medication Instructions   B-12    2500 mcg SL 1 tablet  weekly   cyclobenzaprine 10 MG tablet TAKE 1/2 to 1 TABLET  3 x /day prn    diphenhydramine-Apap  25-500 MG TABS 2 tablets  Daily at bedtime   magnesium    500 mg Daily   Meloxicam    15 MG  tablet TAKE 1/2 TO 1 TABLET DAILY AS NEEDED    montelukast  10 MG tablet TAKE 1 TABLET AT BEDTIME   oxybutynin   5 MG tablet TAKE 1 TABLET 3 TIMES A DAY   Pantoprazole 40 mg Daily   rosuvastatin  20 MG tablet TAKE 1 TABLET DAILY   traMADol    50 mg Every 6 hours PRN   Valsartan     320 mg Daily   Vitamin D  10,000 Units Daily   vitamin E    400 Units Daily     Allergies  Allergen Reactions   Keflex [Cephalexin] Anaphylaxis and Swelling   Codeine     REACTION: nausea/vomiting   Wellbutrin [Bupropion] Diarrhea     PMHx:   Past Medical History:  Diagnosis Date   Allergy    Arthritis    Asthma    as child but out grew   Cataract    small forming   Environmental allergies    GERD (gastroesophageal reflux disease)    Headache    Hiatal hernia    with stricture dilation   Hyperlipidemia    down on meds   Hypertension    Kidney stones 2008   again 8- 2022   Obesity    PONV (postoperative nausea and vomiting)    UTI (urinary tract infection)      Immunization History  Administered Date(s) Administered   Influenza Inj Mdck Quad  11/02/2016, 10/23/2019   Influenza Inj Mdck Quad  10/23/2018   Influenza, Quadrivalent 10/06/2017   Influenza 11/22/2014, 10/31/2015, 10/06/2017   PFIZER SARS-COV-2 Vacc 04/30/2019, 05/21/2019, 11/23/2019   PPD Test 06/01/2013   Pneumococcal-23 06/08/2018   Tdap 03/22/2017     Past Surgical History:  Procedure Laterality Date   BREAST BIOPSY Left    benign    CHOLECYSTECTOMY  1987   COLONOSCOPY     ESOPHAGEAL DILATION     EYE SURGERY     to remove film over eyes   KNEE ARTHROSCOPY Right 2007   REVERSE SHOULDER ARTHROPLASTY Right 06/04/2014   REVERSE SHOULDER ARTHROPLASTY; Cammy Copa, MD   TOTAL HIP ARTHROPLASTY Right 03/19/2014   TOTAL HIP ARTHROPLASTY ANTERIOR APPROACH; Cammy Copa, MD   UPPER GASTROINTESTINAL ENDOSCOPY      FHx:    Reviewed / unchanged  SHx:    Reviewed / unchanged   Systems Review:  Constitutional: Denies fever, chills, wt changes, headaches, insomnia, fatigue, night sweats, change in appetite. Eyes: Denies redness, blurred vision, diplopia, discharge, itchy, watery eyes.  ENT: Denies discharge, congestion, post nasal drip, epistaxis, sore throat, earache, hearing loss, dental pain, tinnitus, vertigo, sinus pain, snoring.  CV: Denies chest pain, palpitations, irregular heartbeat, syncope, dyspnea, diaphoresis, orthopnea, PND, claudication or edema. Respiratory: denies cough, dyspnea, DOE, pleurisy, hoarseness, laryngitis, wheezing.  Gastrointestinal: Denies dysphagia, odynophagia, heartburn, reflux, water brash, abdominal pain or cramps, nausea, vomiting, bloating, diarrhea, constipation, hematemesis, melena, hematochezia  or hemorrhoids. Genitourinary: Denies dysuria, frequency, urgency, nocturia, hesitancy, discharge, hematuria or flank pain. Musculoskeletal: Denies arthralgias, myalgias, stiffness, jt. swelling, pain, limping or strain/sprain.  Skin: Denies pruritus, rash, hives, warts, acne, eczema or change in skin lesion(s). Neuro: No weakness, tremor, incoordination, spasms, paresthesia or pain. Psychiatric: Denies confusion, memory loss or sensory loss. Endo: Denies change in weight, skin or hair change.  Heme/Lymph: No excessive bleeding, bruising or enlarged lymph nodes.  Physical Exam  BP 131/74   Pulse 88   Temp 97.9 F (36.6 C)   Resp 17  Ht 5\' 2"  (1.575 m)   Wt 243 lb 12.8 oz  (110.6 kg)   LMP 10/18/2013   SpO2 98%   BMI 44.59 kg/m   Appears  over  nourished  and in no distress.  Eyes: PERRLA, EOMs, conjunctiva no swelling or erythema. Sinuses: No frontal/maxillary tenderness ENT/Mouth: EAC's clear, TM's nl w/o erythema, bulging. Nares clear w/o erythema, swelling, exudates. Oropharynx clear without erythema or exudates. Oral hygiene is good. Tongue normal, non obstructing. Hearing intact.  Neck: Supple. Thyroid not palpable. Car 2+/2+ without bruits, nodes or JVD. Chest: Respirations nl with BS clear & equal w/o rales, rhonchi, wheezing or stridor.  Cor: Heart sounds normal w/ regular rate and rhythm without sig. murmurs, gallops, clicks or rubs. Peripheral pulses normal and equal  without edema.  Abdomen: Soft & bowel sounds normal. Non-tender w/o guarding, rebound, hernias, masses or organomegaly.  Lymphatics: Unremarkable.  Musculoskeletal: Full ROM all peripheral extremities, joint stability, 5/5 strength and normal gait.  Skin: Warm, dry without exposed rashes, lesions or ecchymosis apparent.  Neuro: Cranial nerves intact, reflexes equal bilaterally. Sensory-motor testing grossly intact. Tendon reflexes grossly intact.  Pysch: Alert & oriented x 3.  Insight and judgement nl & appropriate. No ideations.  Assessment and Plan:   1. Essential hypertension  - CBC with Differential/Platelet - COMPLETE METABOLIC PANEL WITH GFR - Magnesium - TSH   2. Hyperlipidemia, mixed  - Lipid panel - TSH   3. Abnormal glucose  - Hemoglobin A1c - Insulin, random  4. Vitamin D deficiency  - VITAMIN D 25 Hydroxy    5. Fatty liver  - COMPLETE METABOLIC PANEL WITH GFR   6. Morbid obesity with BMI of 45.0-49.9, adult (HCC)  - COMPLETE METABOLIC PANEL WITH GFR - TSH - Hemoglobin A1c - Insulin, random   7. Medication management  - CBC with Differential/Platelet - COMPLETE METABOLIC PANEL WITH GFR - Magnesium - Lipid panel - TSH - Hemoglobin  A1c - Insulin, random - VITAMIN D 25 Hydroxy          Discussed  regular exercise, BP monitoring, weight control to achieve/maintain BMI less than 25 and discussed med and SE's. Recommended labs to assess and monitor clinical status with further disposition pending results of labs.  I discussed the assessment and treatment plan with the patient. The patient was provided an opportunity to ask questions and all were answered. The patient agreed with the plan and demonstrated an understanding of the instructions.  I provided over 30 minutes of exam, counseling, chart review and  complex critical decision making.        The patient was advised to call back or seek an in-person evaluation if the symptoms worsen or if the condition fails to improve as anticipated.   Marinus Maw, MD

## 2022-05-25 ENCOUNTER — Encounter: Payer: Self-pay | Admitting: Internal Medicine

## 2022-05-25 ENCOUNTER — Ambulatory Visit (INDEPENDENT_AMBULATORY_CARE_PROVIDER_SITE_OTHER): Payer: Medicare HMO | Admitting: Internal Medicine

## 2022-05-25 VITALS — BP 131/74 | HR 88 | Temp 97.9°F | Resp 17 | Ht 62.0 in | Wt 243.8 lb

## 2022-05-25 DIAGNOSIS — E782 Mixed hyperlipidemia: Secondary | ICD-10-CM | POA: Diagnosis not present

## 2022-05-25 DIAGNOSIS — E559 Vitamin D deficiency, unspecified: Secondary | ICD-10-CM

## 2022-05-25 DIAGNOSIS — Z6841 Body Mass Index (BMI) 40.0 and over, adult: Secondary | ICD-10-CM

## 2022-05-25 DIAGNOSIS — K760 Fatty (change of) liver, not elsewhere classified: Secondary | ICD-10-CM | POA: Diagnosis not present

## 2022-05-25 DIAGNOSIS — H2513 Age-related nuclear cataract, bilateral: Secondary | ICD-10-CM | POA: Diagnosis not present

## 2022-05-25 DIAGNOSIS — R7309 Other abnormal glucose: Secondary | ICD-10-CM | POA: Diagnosis not present

## 2022-05-25 DIAGNOSIS — H5203 Hypermetropia, bilateral: Secondary | ICD-10-CM | POA: Diagnosis not present

## 2022-05-25 DIAGNOSIS — Z79899 Other long term (current) drug therapy: Secondary | ICD-10-CM | POA: Diagnosis not present

## 2022-05-25 DIAGNOSIS — I10 Essential (primary) hypertension: Secondary | ICD-10-CM

## 2022-05-25 MED ORDER — TOPIRAMATE 50 MG PO TABS: ORAL_TABLET | ORAL | 1 refills | Status: AC

## 2022-05-25 NOTE — Patient Instructions (Signed)

## 2022-05-26 ENCOUNTER — Encounter: Payer: Self-pay | Admitting: Internal Medicine

## 2022-05-26 LAB — CBC WITH DIFFERENTIAL/PLATELET
Absolute Monocytes: 911 cells/uL (ref 200–950)
Basophils Absolute: 41 cells/uL (ref 0–200)
Basophils Relative: 0.6 %
Eosinophils Absolute: 150 cells/uL (ref 15–500)
Eosinophils Relative: 2.2 %
HCT: 37.8 % (ref 35.0–45.0)
Hemoglobin: 12.5 g/dL (ref 11.7–15.5)
Lymphs Abs: 1992 cells/uL (ref 850–3900)
MCH: 30 pg (ref 27.0–33.0)
MCHC: 33.1 g/dL (ref 32.0–36.0)
MCV: 90.6 fL (ref 80.0–100.0)
MPV: 10.2 fL (ref 7.5–12.5)
Monocytes Relative: 13.4 %
Neutro Abs: 3706 cells/uL (ref 1500–7800)
Neutrophils Relative %: 54.5 %
Platelets: 328 10*3/uL (ref 140–400)
RBC: 4.17 10*6/uL (ref 3.80–5.10)
RDW: 13 % (ref 11.0–15.0)
Total Lymphocyte: 29.3 %
WBC: 6.8 10*3/uL (ref 3.8–10.8)

## 2022-05-26 LAB — VITAMIN D 25 HYDROXY (VIT D DEFICIENCY, FRACTURES): Vit D, 25-Hydroxy: 72 ng/mL (ref 30–100)

## 2022-05-26 LAB — COMPLETE METABOLIC PANEL WITH GFR
AG Ratio: 1.3 (calc) (ref 1.0–2.5)
ALT: 13 U/L (ref 6–29)
AST: 16 U/L (ref 10–35)
Albumin: 4 g/dL (ref 3.6–5.1)
Alkaline phosphatase (APISO): 57 U/L (ref 37–153)
BUN/Creatinine Ratio: 17 (calc) (ref 6–22)
BUN: 21 mg/dL (ref 7–25)
CO2: 22 mmol/L (ref 20–32)
Calcium: 9.7 mg/dL (ref 8.6–10.4)
Chloride: 106 mmol/L (ref 98–110)
Creat: 1.22 mg/dL — ABNORMAL HIGH (ref 0.50–1.05)
Globulin: 3.1 g/dL (calc) (ref 1.9–3.7)
Glucose, Bld: 98 mg/dL (ref 65–99)
Potassium: 4.3 mmol/L (ref 3.5–5.3)
Sodium: 140 mmol/L (ref 135–146)
Total Bilirubin: 0.4 mg/dL (ref 0.2–1.2)
Total Protein: 7.1 g/dL (ref 6.1–8.1)
eGFR: 49 mL/min/{1.73_m2} — ABNORMAL LOW (ref >=60)

## 2022-05-26 LAB — HEMOGLOBIN A1C
Hgb A1c MFr Bld: 5.8 % of total Hgb — ABNORMAL HIGH (ref <5.7)
Mean Plasma Glucose: 120 mg/dL
eAG (mmol/L): 6.6 mmol/L

## 2022-05-26 LAB — LIPID PANEL
Cholesterol: 98 mg/dL (ref <200)
HDL: 33 mg/dL — ABNORMAL LOW (ref >=50)
LDL Cholesterol (Calc): 38 mg/dL (calc)
Non-HDL Cholesterol (Calc): 65 mg/dL (calc) (ref <130)
Total CHOL/HDL Ratio: 3 (calc) (ref <5.0)
Triglycerides: 207 mg/dL — ABNORMAL HIGH (ref <150)

## 2022-05-26 LAB — TSH: TSH: 2.73 mIU/L (ref 0.40–4.50)

## 2022-05-26 LAB — MAGNESIUM: Magnesium: 2 mg/dL (ref 1.5–2.5)

## 2022-05-26 LAB — INSULIN, RANDOM: Insulin: 43.2 u[IU]/mL — ABNORMAL HIGH

## 2022-05-26 NOTE — Progress Notes (Signed)
/^<^<^<^<^<^<^<^<^<^<^<^<^<^<^<^<^<^<^<^<^<^<^<^<^<^<^<^<^<^<^<^<^<^<^<^<^ ^>^>^>^>^>^>^>^>^>^>^>>^>^>^>^>^>^>^>^>^>^>^>^>^>^>^>^>^>^>^>^>^>^>^>^>^>  -Test results slightly outside the reference range are not unusual. If there is anything important, I will review this with you,  otherwise it is considered normal test values.  If you have further questions,  please do not hesitate to contact me at the office or via My Chart.   ^<^<^<^<^<^<^<^<^<^<^<^<^<^<^<^<^<^<^<^<^<^<^<^<^<^<^<^<^<^<^<^<^<^<^<^<^ ^>^>^>^>^>^>^>^>^>^>^>^>^>^>^>^>^>^>^>^>^>^>^>^>^>^>^>^>^>^>^>^>^>^>^>^>^   -  Kidney functions still look a little dehydrated   -  Very important to drink adequate amounts of fluids to prevent permanent damage    -   Recommend drink at least 6 bottles (16 ounces) of fluids /water /day =                                                                                                                 96 Oz ~100 oz  -  100 oz = 3,000 cc or 3 liters / day  - >>                                                           That's 1 &1/2 bottles of a 2 liter soda bottle /day !  ^>^>^>^>^>^>^>^>^>^>^>^>^>^>^>^>^>^>^>^>^>^>^>^>^>^>^>^>^>^>^>^>^>^>^>^>^ ^>^>^>^>^>^>^>^>^>^>^>^>^>^>^>^>^>^>^>^>^>^>^>^>^>^>^>^>^>^>^>^>^>^>^>^>^  -  Total Chol = 98   &  LDL Chol = 38  - Both Wonderful   ~   Excellent   - Very low risk for Heart Attack  / Stroke - Please continue Crestor Same  ============================================================ ============================================================  -  But Triglycerides ( 207 ) or fats in blood are too high                 (   Ideal or  Goal is less than 150  !  )    - Recommend avoid fried & greasy foods,  sweets / candy,   - Avoid white rice  (brown or wild rice or Quinoa is OK),   - Avoid white potatoes  (sweet potatoes are OK)   - Avoid anything made from white flour  - bagels, doughnuts, rolls, buns, biscuits, white and   wheat breads, pizza  crust and traditional  pasta made of white flour & egg white  - (vegetarian pasta or spinach or wheat pasta is OK).    - Multi-grain bread is OK - like multi-grain flat bread or  sandwich thins.   - Avoid alcohol in excess.   - Exercise is also important. ^>^>^>^>^>^>^>^>^>^>^>^>^>^>^>^>^>^>^>^>^>^>^>^>^>^>^>^>^>^>^>^>^>^>^>^>^ ^>^>^>^>^>^>^>^>^>^>^>^>^>^>^>^>^>^>^>^>^>^>^>^>^>^>^>^>^>^>^>^>^>^>^>^>^  -   A1c = 5.8%  Blood sugar and A1c are elevated in the borderline and                                                      early or pre-diabetes range which has the same  300% increased risk for heart attack, stroke, cancer and                                             alzheimer- type vascular dementia as full blown diabetes.   But the good news is that diet, exercise with                                                     weight loss can cure the early diabetes at this point. ^>^>^>^>^>^>^>^>^>^>^>^>^>^>^>^>^>^>^>^>^>^>^>^>^>^>^>^>^>^>^>^>^>^>^>^>^ ^>^>^>^>^>^>^>^>^>^>^>^>^>^>^>^>^>^>^>^>^>^>^>^>^>^>^>^>^>^>^>^>^>^>^>^>^  -  Vitamin D = 72 - Excellent  - Please keep dose same  ! ^>^>^>^>^>^>^>^>^>^>^>^>^>^>^>^>^>^>^>^>^>^>^>^>^>^>^>^>^>^>^>^>^>^>^>^>^  -  All Else - CBC - Kidneys - Electrolytes - Liver - Magnesium & Thyroid    - all  Normal / OK ^>^>^>^>^>^>^>^>^>^>^>^>^>^>^>^>^>^>^>^>^>^>^>^>^>^>^>^>^>^>^>^>^>^>^>^>^  -   Keep up the [place]  Work  ! ^>^>^>^>^>^>^>^>^>^>^>^>^>^>^>^>^>^>^>^>^>^>^>^>^>^>^>^>^>^>^>^>^>^>^>^>^ ^>^>^>^>^>^>^>^>^>^>^>^>^>^>^>^>^>^>^>^>^>^>^>^>^>^>^>^>^>^>^>^>^>^>^>^>^

## 2022-05-27 ENCOUNTER — Ambulatory Visit: Payer: Medicare HMO | Admitting: Internal Medicine

## 2022-05-29 ENCOUNTER — Other Ambulatory Visit: Payer: Self-pay | Admitting: Nurse Practitioner

## 2022-05-29 DIAGNOSIS — E782 Mixed hyperlipidemia: Secondary | ICD-10-CM

## 2022-06-17 ENCOUNTER — Encounter: Payer: Self-pay | Admitting: Nurse Practitioner

## 2022-06-17 DIAGNOSIS — K591 Functional diarrhea: Secondary | ICD-10-CM

## 2022-06-18 MED ORDER — CHOLESTYRAMINE 4 G PO PACK: 1.0000 | PACK | Freq: Two times a day (BID) | ORAL | 0 refills | Status: DC

## 2022-06-23 ENCOUNTER — Other Ambulatory Visit: Payer: Self-pay | Admitting: Internal Medicine

## 2022-06-23 DIAGNOSIS — Z1231 Encounter for screening mammogram for malignant neoplasm of breast: Secondary | ICD-10-CM

## 2022-07-03 ENCOUNTER — Encounter: Payer: Self-pay | Admitting: Nurse Practitioner

## 2022-07-03 ENCOUNTER — Other Ambulatory Visit: Payer: Self-pay | Admitting: Nurse Practitioner

## 2022-07-03 DIAGNOSIS — I10 Essential (primary) hypertension: Secondary | ICD-10-CM

## 2022-07-03 DIAGNOSIS — N3946 Mixed incontinence: Secondary | ICD-10-CM

## 2022-07-05 MED ORDER — OXYBUTYNIN CHLORIDE 5 MG PO TABS
5.0000 mg | ORAL_TABLET | Freq: Two times a day (BID) | ORAL | 2 refills | Status: DC
Start: 2022-07-05 — End: 2022-10-18

## 2022-07-12 ENCOUNTER — Encounter: Payer: Medicare HMO | Admitting: Nurse Practitioner

## 2022-07-14 ENCOUNTER — Encounter: Payer: Self-pay | Admitting: Nurse Practitioner

## 2022-08-02 ENCOUNTER — Ambulatory Visit: Admission: RE | Admit: 2022-08-02 | Payer: Medicare HMO | Source: Ambulatory Visit

## 2022-08-02 DIAGNOSIS — Z1231 Encounter for screening mammogram for malignant neoplasm of breast: Secondary | ICD-10-CM

## 2022-08-13 ENCOUNTER — Other Ambulatory Visit: Payer: Self-pay | Admitting: Physician Assistant

## 2022-08-23 ENCOUNTER — Encounter: Payer: Medicare HMO | Admitting: Nurse Practitioner

## 2022-08-26 ENCOUNTER — Ambulatory Visit (INDEPENDENT_AMBULATORY_CARE_PROVIDER_SITE_OTHER): Payer: Medicare HMO | Admitting: Nurse Practitioner

## 2022-08-26 ENCOUNTER — Encounter: Payer: Self-pay | Admitting: Nurse Practitioner

## 2022-08-26 VITALS — BP 130/80 | HR 100 | Temp 97.9°F | Resp 17 | Ht 62.0 in | Wt 248.4 lb

## 2022-08-26 DIAGNOSIS — K219 Gastro-esophageal reflux disease without esophagitis: Secondary | ICD-10-CM | POA: Diagnosis not present

## 2022-08-26 DIAGNOSIS — I10 Essential (primary) hypertension: Secondary | ICD-10-CM

## 2022-08-26 DIAGNOSIS — F3340 Major depressive disorder, recurrent, in remission, unspecified: Secondary | ICD-10-CM

## 2022-08-26 DIAGNOSIS — K760 Fatty (change of) liver, not elsewhere classified: Secondary | ICD-10-CM

## 2022-08-26 DIAGNOSIS — Z0000 Encounter for general adult medical examination without abnormal findings: Secondary | ICD-10-CM | POA: Diagnosis not present

## 2022-08-26 DIAGNOSIS — R8271 Bacteriuria: Secondary | ICD-10-CM | POA: Diagnosis not present

## 2022-08-26 DIAGNOSIS — R319 Hematuria, unspecified: Secondary | ICD-10-CM

## 2022-08-26 DIAGNOSIS — E559 Vitamin D deficiency, unspecified: Secondary | ICD-10-CM | POA: Diagnosis not present

## 2022-08-26 DIAGNOSIS — Z0001 Encounter for general adult medical examination with abnormal findings: Secondary | ICD-10-CM

## 2022-08-26 DIAGNOSIS — Z79899 Other long term (current) drug therapy: Secondary | ICD-10-CM

## 2022-08-26 DIAGNOSIS — Z87442 Personal history of urinary calculi: Secondary | ICD-10-CM

## 2022-08-26 DIAGNOSIS — Z136 Encounter for screening for cardiovascular disorders: Secondary | ICD-10-CM

## 2022-08-26 DIAGNOSIS — K449 Diaphragmatic hernia without obstruction or gangrene: Secondary | ICD-10-CM

## 2022-08-26 DIAGNOSIS — R7309 Other abnormal glucose: Secondary | ICD-10-CM

## 2022-08-26 DIAGNOSIS — E782 Mixed hyperlipidemia: Secondary | ICD-10-CM

## 2022-08-26 DIAGNOSIS — M1990 Unspecified osteoarthritis, unspecified site: Secondary | ICD-10-CM

## 2022-08-26 DIAGNOSIS — E538 Deficiency of other specified B group vitamins: Secondary | ICD-10-CM | POA: Diagnosis not present

## 2022-08-26 NOTE — Patient Instructions (Signed)

## 2022-08-26 NOTE — Progress Notes (Signed)
CPE  Assessment and Plan:  Annual Physical Due annually  Health maintenance reviewed  Morbid obesity (HCC) BMI 45  Discussed appropriate BMI Diet modification. Physical activity. Encouraged/praised to build confidence.  Essential hypertension Continue Valsartan Discussed DASH (Dietary Approaches to Stop Hypertension) DASH diet is lower in sodium than a typical American diet. Cut back on foods that are high in saturated fat, cholesterol, and trans fats. Eat more whole-grain foods, fish, poultry, and nuts Remain active and exercise as tolerated daily.  Monitor BP at home-Call if greater than 130/80.  Check CMP/CBC  Hyperlipidemia, unspecified hyperlipidemia type Continue Rosuvastatin Discussed lifestyle modifications. Recommended diet heavy in fruits and veggies, omega 3's. Decrease consumption of animal meats, cheeses, and dairy products. Remain active and exercise as tolerated. Continue to monitor. Check lipids/TSH  Medication management All medications discussed and reviewed in full. All questions and concerns regarding medications addressed.    Vitamin D deficiency At goal at recent check; continue to recommend supplementation for goal of 60-100 Monitor levels  Abnormal glucose (hx of prediabetes) Education: Reviewed 'ABCs' of diabetes management  Discussed goals to be met and/or maintained include A1C (<7) Blood pressure (<130/80) Cholesterol (LDL <70) Continue Eye Exam yearly  Continue Dental Exam Q6 mo Discussed dietary recommendations Discussed Physical Activity recommendations Check A1C and insulin  Depression, major, in remission (HCC) In remission off of medications; monitor Lifestyle discussed: diet/exerise, sleep hygiene, stress management, hydration  Hiatal hernia/ GERD Continue PPI/H2 blocker, diet discussed Weight loss  Fatty liver Weight loss advised, avoid alcohol/tylenol, will monitor LFTs  Osteoarthritis Follows with Dr. August Saucer, taking  tylenol/meloxicam (limited due to organ functions). Discussed topicals - voltaren, aspercreme QID, can wrap after application. Icing PRN. Continue with weight loss efforts.   History of kidney stones [facility] following Stay well hydrated  Chronic bacteriuria/hematuria [facility] following  B12 deficiency Monitor levels  GERD No suspected reflux complications (Barret/stricture). Lifestyle modification:  wt loss, avoid meals 2-3h before bedtime. Consider eliminating food triggers:  chocolate, caffeine, EtOH, acid/spicy food.     Notify office for further evaluation and treatment, questions or concerns if any reported s/s fail to improve.   The patient was advised to call back or seek an in-person evaluation if any symptoms worsen or if the condition fails to improve as anticipated.   Further disposition pending results of labs. Discussed med's effects and SE's.    I discussed the assessment and treatment plan with the patient. The patient was provided an opportunity to ask questions and all were answered. The patient agreed with the plan and demonstrated an understanding of the instructions.  Discussed med's effects and SE's. Screening labs and tests as requested with regular follow-up as recommended.  I provided 35 minutes of face-to-face time during this encounter including counseling, chart review, and critical decision making was preformed.   Future Appointments  Date Time Provider Department Center  01/13/2023 11:00 AM Adela Glimpse, NP [facility] None  08/26/2023  9:00 AM Adela Glimpse, NP [facility] None   HPI  67 y.o. female  presents for an annual CPE and follow up. She has Hypertension; Hyperlipidemia; GERD (gastroesophageal reflux disease); Hiatal hernia; Morbid obesity (HCC); Arthritis of hip; Arthritis of shoulder region, right, degenerative; Medication management; Vitamin D deficiency; Abnormal glucose; Osteoarthritis; Recurrent major depressive  disorder, in remission (HCC); B12 deficiency; CKD (chronic kidney disease) stage 3, GFR 30-59 ml/min (HCC); Bilateral knee pain; Fatty liver; Hematuria; Bacteriuria, chronic; History of kidney stones; and Dysphagia on their problem list.  Overall she  reports feeling well.  She has no new concerns in clinic today.  She has had hip and shoulder replacement with Dr. August Saucer, doing well. Does have bil knee pain, arthritis, currently managing with meloxicam 15 mg every 3 days a week, tylenol PM at night. She supplements with topical aspercreme.   She has hx of MDD, recently remains in remission off of medication.   She has chronic bacteriuria and recently with microscopic hematuria, kidney stones, underwent cysto/laser/ureteral stent placement in Nov 2022 with Dr. Kasandra Knudsen. Oxybutynin is beneficial for frequency.   BMI is Body mass index is 45.43 kg/m., she has been working on diet  and exercise. Has taken Phentermine in the past but has stopped d/t SE of diarrhea. Wt Readings from Last 3 Encounters:  08/26/22 248 lb 6.4 oz (112.7 kg)  05/25/22 243 lb 12.8 oz (110.6 kg)  02/15/22 257 lb (116.6 kg)   Her blood pressure has been controlled at home, today their BP is BP: 130/80.   She does workout. She denies chest pain, shortness of breath, dizziness.   She is on cholesterol medication, she is taking crestor 20 mg daily, heart disease brother died 56, mother with MI in her 55's. Her cholesterol is at goal. The cholesterol last visit was:  Lab Results  Component Value Date   CHOL 98 05/25/2022   HDL 33 (L) 05/25/2022   LDLCALC 38 05/25/2022   TRIG 207 (H) 05/25/2022   CHOLHDL 3.0 05/25/2022  . She has been working on diet and exercise for hx of prediabetes (A1C 5.9% in 2016), she is not on bASA, she is not on ACE/ARB and denies foot ulcerations, hyperglycemia, hypoglycemia , increased appetite, nausea, paresthesia of the feet, polydipsia, polyuria, visual disturbances, vomiting and weight  loss. Last A1C in the office was:  Lab Results  Component Value Date   HGBA1C 5.8 (H) 05/25/2022    Last GFR:  Lab Results  Component Value Date   EGFR 49 (L) 05/25/2022   Patient is on Vitamin D supplement,10,000.  Lab Results  Component Value Date   VD25OH 72 05/25/2022     She has reduced B12 supplement dose, has 2500 mcg daily Lab Results  Component Value Date   VITAMINB12 >2,000 (H) 02/15/2022    Current Medications:    Current Outpatient Medications (Cardiovascular):    rosuvastatin (CRESTOR) 20 MG tablet, TAKE 1 TABLET DAILY FOR    CHOLESTEROL   valsartan (DIOVAN) 320 MG tablet, TAKE 1 TABLET DAILY   cholestyramine (QUESTRAN) 4 g packet, Take 1 packet by mouth 2 (two) times daily for 3 days.  Current Outpatient Medications (Respiratory):    montelukast (SINGULAIR) 10 MG tablet, Take 1 tablet (10 mg total) by mouth at bedtime.  Current Outpatient Medications (Analgesics):    meloxicam (MOBIC) 15 MG tablet, TAKE 1/2 TO 1 TABLET DAILY AS NEEDED WITH FOOD FOR PAIN AND INFLAMMATION   traMADol (ULTRAM) 50 MG tablet, Take 1 tablet (50 mg total) by mouth every 6 (six) hours as needed.  Current Outpatient Medications (Hematological):    Cyanocobalamin (B-12) 2500 MCG SUBL, Place 1 tablet under the tongue once a week.  Current Outpatient Medications (Other):    Cholecalciferol (VITAMIN D) 125 MCG (5000 UT) CAPS, Take 10,000 Units by mouth daily.   cyclobenzaprine (FLEXERIL) 10 MG tablet, TAKE 1/2 1 TABLET BY MOUTH THREE TIMES A DAY AS NEEDED FOR MUSCLE SPASMS   diphenhydramine-acetaminophen (TYLENOL PM) 25-500 MG TABS, Take 2 tablets by mouth at bedtime.   magnesium  gluconate (MAGONATE) 500 MG tablet, Take 500 mg by mouth daily.   oxybutynin (DITROPAN) 5 MG tablet, Take 1 tablet (5 mg total) by mouth 2 (two) times daily.   pantoprazole (PROTONIX) 40 MG tablet, TAKE 1 TABLET BY MOUTH EVERY DAY   phentermine (ADIPEX-P) 37.5 MG tablet, Take 1/2 to 1 tablet every Morning for  Dieting & Weight Loss (Patient not taking: Reported on 08/26/2022)   topiramate (TOPAMAX) 50 MG tablet, Take 1/2 to 1 tablet 2 x /day at Suppertime & Bedtime for Dieting & Weight Loss (Patient not taking: Reported on 08/26/2022)  Health Maintenance:   Immunization History  Administered Date(s) Administered   Influenza Inj Mdck Quad Pf 11/02/2016, 10/23/2019   Influenza Inj Mdck Quad With Preservative 10/23/2018   Influenza, Quadrivalent, Recombinant, Inj, Pf 10/06/2017   Influenza,inj,Quad PF,6+ Mos 10/14/2020, 11/12/2021   Influenza-Unspecified 11/22/2014, 10/31/2015, 10/06/2017   PFIZER(Purple Top)SARS-COV-2 Vaccination 04/30/2019, 05/21/2019, 11/23/2019   PPD Test 06/01/2013   Pneumococcal Polysaccharide-23 06/08/2018   Tdap 03/22/2017   Health Maintenance  Topic Date Due   Zoster Vaccines- Shingrix (1 of 2) Never done   COVID-19 Vaccine (4 - 2023-24 season) 09/04/2021   Pneumonia Vaccine 34+ Years old (2 of 2 - PCV) 04/27/2022   INFLUENZA VACCINE  08/05/2022   Medicare Annual Wellness (AWV)  02/16/2023   MAMMOGRAM  08/01/2024   Colonoscopy  09/22/2025   PAP SMEAR-Modifier  07/10/2026   DTaP/Tdap/Td (2 - Td or Tdap) 03/23/2027   DEXA SCAN  Completed   Hepatitis C Screening  Completed   HIV Screening  Completed   HPV VACCINES  Aged Out   Prevnar 20 at 22 Shingrix: check with insurance, plans to get this year Covid 19: 3/3, pfizer, declines booster  Colonoscopy 09/22/2020- Dr. Rhea Belton, no polyps, diverticula, 5 year recall due to family hx  MMG: 07/2022 DEXA 05/2013 T 0.1 Due age 85 Obtain 2025 with [employer] PAP 2019 normal, doing here, neg HPV q 5 years, - plan for 2024 CPE  Last eye: Dr. Orson Slick, glasses, last 2024, goes annually Last dental: overdue, last 3-4 years, Dr. Amie Critchley retired, needs to schedule with Dr. Lennice Sites   Patient Care Team: Lucky Cowboy, MD as PCP - General (Internal Medicine) August Saucer, Corrie Mckusick, MD as Consulting Physician (Orthopedic Surgery) Venancio Poisson, MD as Consulting Physician (Dermatology) Pyrtle, Carie Caddy, MD as Consulting Physician (Gastroenterology)  Medical History:  Past Medical History:  Diagnosis Date   Allergy    Anemia    Arthritis    Asthma    as child but out grew   Cataract    small forming   Diverticula of colon    Environmental allergies    GERD (gastroesophageal reflux disease)    History of kidney stones    Hyperlipidemia    down on meds   Hypertension    Kidney stones 2008   again 8- 2022   Obesity, unspecified    PONV (postoperative nausea and vomiting)    UTI (urinary tract infection)    Vertigo     Allergies Allergies  Allergen Reactions   Keflex [Cephalexin] Anaphylaxis and Swelling   Codeine Nausea And Vomiting   Wellbutrin [Bupropion] Diarrhea    SURGICAL HISTORY She  has a past surgical history that includes Cholecystectomy (1987); Esophageal dilation; Knee arthroscopy (Right, 2007); Total hip arthroplasty (Right, 03/19/2014); Reverse shoulder arthroplasty (Right, 06/04/2014); Breast biopsy (Left); Upper gastrointestinal endoscopy; Colonoscopy; Eye surgery; Wisdom tooth extraction; Cystoscopy with retrograde pyelogram, ureteroscopy and stent placement (Right, 10/28/2020); Holmium  laser application (Right, 10/28/2020); Cystoscopy/ureteroscopy/holmium laser/stent placement (Bilateral, 11/18/2020); Cystoscopy with retrograde pyelogram, ureteroscopy and stent placement (Bilateral, 10/20/2021); and Holmium laser application (Bilateral, 10/20/2021). FAMILY HISTORY Her family history includes Cancer in her father; Colon cancer in her mother; Esophageal cancer in her father; Heart disease in her brother and mother; Hyperlipidemia in her mother; Hypertension in her mother; Pulmonary embolism in her brother; Stroke in her mother; Throat cancer in her father. SOCIAL HISTORY She  reports that she has never smoked. She has never used smokeless tobacco. She reports that she does not drink alcohol and does  not use drugs.  Review of Systems: Review of Systems  Constitutional:  Negative for chills, fever, malaise/fatigue and weight loss.  HENT:  Negative for congestion, ear discharge, ear pain, hearing loss, sore throat and tinnitus.   Eyes: Negative.  Negative for blurred vision and double vision.  Respiratory:  Negative for cough, sputum production, shortness of breath and wheezing.   Cardiovascular:  Negative for chest pain, palpitations, orthopnea, claudication, leg swelling and PND.  Gastrointestinal:  Negative for abdominal pain, blood in stool, constipation, diarrhea, heartburn, melena, nausea and vomiting.  Genitourinary: Negative.  Negative for dysuria, flank pain, frequency, hematuria and urgency.  Musculoskeletal:  Positive for joint pain (knee pain). Negative for falls and myalgias.  Skin: Negative.  Negative for rash.  Neurological:  Negative for dizziness, tingling, sensory change, loss of consciousness, weakness and headaches.  Endo/Heme/Allergies:  Negative for polydipsia.  Psychiatric/Behavioral: Negative.  Negative for depression, hallucinations, memory loss, substance abuse and suicidal ideas. The patient is not nervous/anxious and does not have insomnia.   All other systems reviewed and are negative.   Physical Exam: Estimated body mass index is 45.43 kg/m as calculated from the following:   Height as of this encounter: 5\' 2"  (1.575 m).   Weight as of this encounter: 248 lb 6.4 oz (112.7 kg). BP 130/80   Pulse 100   Temp 97.9 F (36.6 C)   Resp 17   Ht 5\' 2"  (1.575 m)   Wt 248 lb 6.4 oz (112.7 kg)   LMP 10/18/2013   SpO2 96%   BMI 45.43 kg/m   General Appearance: Well nourished well developed, in no apparent distress.  Eyes: PERRLA, EOMs, conjunctiva no swelling or erythema ENT/Mouth: Ear canals normal without obstruction, swelling, erythema, or discharge.  TMs normal bilaterally with no erythema, bulging, retraction, or loss of landmark.  Neck: Supple, thyroid  normal, No bruits.  No cervical adenopathy Respiratory: Respiratory effort normal, Breath sounds clear A&P without wheeze, rhonchi, rales.   Cardio: RRR without murmurs, rubs or gallops. Brisk peripheral pulses without edema.  Chest: symmetric, with normal excursions Breasts: defer - no concerns, has upcoming mammogram  Abdomen: Morbidly obese,  Soft, nontender, no guarding, rebound, hernias, masses, or organomegaly.  Lymphatics: Non tender without lymphadenopathy.  Genitourinary: defer Musculoskeletal: No obvious deformity, ROM limited by body habitus, 4/5 strength, and slow antalgic gait.  Skin: Warm, dry without rashes, lesions, ecchymosis. Neuro: Awake and oriented X 3, Cranial nerves intact, reflexes equal bilaterally. Normal muscle tone, no cerebellar symptoms. Sensation intact.  Psych:  normal affect, Insight and Judgment appropriate.    Christine [name] 9:06 AM [facility]

## 2022-08-27 LAB — URINALYSIS, ROUTINE W REFLEX MICROSCOPIC
Bilirubin Urine: NEGATIVE
Glucose, UA: NEGATIVE
Hgb urine dipstick: NEGATIVE
Hyaline Cast: NONE SEEN /LPF
Ketones, ur: NEGATIVE
Nitrite: NEGATIVE
Protein, ur: NEGATIVE
RBC / HPF: NONE SEEN /HPF (ref 0–2)
Specific Gravity, Urine: 1.005 (ref 1.001–1.035)
Squamous Epithelial / HPF: NONE SEEN /HPF (ref <=5)
pH: 6 (ref 5.0–8.0)

## 2022-08-27 LAB — CBC WITH DIFFERENTIAL/PLATELET
Absolute Monocytes: 519 {cells}/uL (ref 200–950)
Basophils Absolute: 40 cells/uL (ref 0–200)
Basophils Relative: 0.7 %
Eosinophils Absolute: 399 {cells}/uL (ref 15–500)
Eosinophils Relative: 7 %
HCT: 41 % (ref 35.0–45.0)
Hemoglobin: 13.4 g/dL (ref 11.7–15.5)
Lymphs Abs: 1590 {cells}/uL (ref 850–3900)
MCH: 29.8 pg (ref 27.0–33.0)
MCHC: 32.7 g/dL (ref 32.0–36.0)
MCV: 91.3 fL (ref 80.0–100.0)
MPV: 10.1 fL (ref 7.5–12.5)
Monocytes Relative: 9.1 %
Neutro Abs: 3152 {cells}/uL (ref 1500–7800)
Neutrophils Relative %: 55.3 %
Platelets: 237 10*3/uL (ref 140–400)
RBC: 4.49 10*6/uL (ref 3.80–5.10)
RDW: 13.1 % (ref 11.0–15.0)
Total Lymphocyte: 27.9 %
WBC: 5.7 10*3/uL (ref 3.8–10.8)

## 2022-08-27 LAB — COMPLETE METABOLIC PANEL WITH GFR
AG Ratio: 1.3 (calc) (ref 1.0–2.5)
ALT: 20 U/L (ref 6–29)
AST: 22 U/L (ref 10–35)
Albumin: 4.6 g/dL (ref 3.6–5.1)
Alkaline phosphatase (APISO): 57 U/L (ref 37–153)
BUN: 18 mg/dL (ref 7–25)
CO2: 27 mmol/L (ref 20–32)
Calcium: 10.2 mg/dL (ref 8.6–10.4)
Chloride: 101 mmol/L (ref 98–110)
Creat: 0.93 mg/dL (ref 0.50–1.05)
Globulin: 3.5 g/dL (ref 1.9–3.7)
Glucose, Bld: 97 mg/dL (ref 65–99)
Potassium: 4.5 mmol/L (ref 3.5–5.3)
Sodium: 138 mmol/L (ref 135–146)
Total Bilirubin: 0.6 mg/dL (ref 0.2–1.2)
Total Protein: 8.1 g/dL (ref 6.1–8.1)
eGFR: 68 mL/min/{1.73_m2} (ref >=60)

## 2022-08-27 LAB — LIPID PANEL
Cholesterol: 139 mg/dL (ref <200)
HDL: 49 mg/dL — ABNORMAL LOW (ref >=50)
LDL Cholesterol (Calc): 64 mg/dL
Non-HDL Cholesterol (Calc): 90 mg/dL (ref <130)
Total CHOL/HDL Ratio: 2.8 (calc) (ref <5.0)
Triglycerides: 190 mg/dL — ABNORMAL HIGH (ref <150)

## 2022-08-27 LAB — MICROSCOPIC MESSAGE

## 2022-08-27 LAB — MICROALBUMIN / CREATININE URINE RATIO
Creatinine, Urine: 31 mg/dL (ref 20–275)
Microalb Creat Ratio: 10 mg/g{creat} (ref <30)
Microalb, Ur: 0.3 mg/dL

## 2022-08-27 LAB — HEMOGLOBIN A1C
Hgb A1c MFr Bld: 5.5 %{Hb} (ref <5.7)
Mean Plasma Glucose: 111 mg/dL
eAG (mmol/L): 6.2 mmol/L

## 2022-08-27 LAB — MAGNESIUM: Magnesium: 2 mg/dL (ref 1.5–2.5)

## 2022-08-27 LAB — INSULIN, RANDOM: Insulin: 37.8 u[IU]/mL — ABNORMAL HIGH

## 2022-08-27 LAB — VITAMIN D 25 HYDROXY (VIT D DEFICIENCY, FRACTURES): Vit D, 25-Hydroxy: 65 ng/mL (ref 30–100)

## 2022-08-27 LAB — VITAMIN B12: Vitamin B-12: >2000 pg/mL — ABNORMAL HIGH (ref 200–1100)

## 2022-08-27 LAB — TSH: TSH: 2.35 mIU/L (ref 0.40–4.50)

## 2022-08-30 ENCOUNTER — Encounter: Payer: Self-pay | Admitting: Nurse Practitioner

## 2022-10-05 ENCOUNTER — Encounter: Payer: Self-pay | Admitting: Nurse Practitioner

## 2022-10-13 DIAGNOSIS — N200 Calculus of kidney: Secondary | ICD-10-CM | POA: Diagnosis not present

## 2022-10-13 DIAGNOSIS — R8271 Bacteriuria: Secondary | ICD-10-CM | POA: Diagnosis not present

## 2022-10-16 ENCOUNTER — Other Ambulatory Visit: Payer: Self-pay | Admitting: Nurse Practitioner

## 2022-10-16 DIAGNOSIS — N3946 Mixed incontinence: Secondary | ICD-10-CM

## 2022-10-21 ENCOUNTER — Encounter: Payer: Self-pay | Admitting: Nurse Practitioner

## 2022-10-25 ENCOUNTER — Encounter: Payer: Self-pay | Admitting: Internal Medicine

## 2022-10-27 ENCOUNTER — Encounter: Payer: Self-pay | Admitting: Nurse Practitioner

## 2022-10-27 MED ORDER — CYCLOBENZAPRINE HCL 10 MG PO TABS: ORAL_TABLET | ORAL | 1 refills | Status: AC

## 2022-11-03 ENCOUNTER — Other Ambulatory Visit: Payer: Self-pay | Admitting: Internal Medicine

## 2022-11-03 DIAGNOSIS — M1990 Unspecified osteoarthritis, unspecified site: Secondary | ICD-10-CM

## 2022-11-30 ENCOUNTER — Encounter: Payer: Self-pay | Admitting: Nurse Practitioner

## 2022-11-30 ENCOUNTER — Ambulatory Visit (INDEPENDENT_AMBULATORY_CARE_PROVIDER_SITE_OTHER): Payer: Medicare HMO | Admitting: Nurse Practitioner

## 2022-11-30 VITALS — BP 120/74 | HR 82 | Temp 97.8°F | Ht 62.0 in | Wt 250.8 lb

## 2022-11-30 DIAGNOSIS — M1990 Unspecified osteoarthritis, unspecified site: Secondary | ICD-10-CM

## 2022-11-30 DIAGNOSIS — E559 Vitamin D deficiency, unspecified: Secondary | ICD-10-CM | POA: Diagnosis not present

## 2022-11-30 DIAGNOSIS — R8271 Bacteriuria: Secondary | ICD-10-CM | POA: Diagnosis not present

## 2022-11-30 DIAGNOSIS — K219 Gastro-esophageal reflux disease without esophagitis: Secondary | ICD-10-CM

## 2022-11-30 DIAGNOSIS — N200 Calculus of kidney: Secondary | ICD-10-CM

## 2022-11-30 DIAGNOSIS — Z6841 Body Mass Index (BMI) 40.0 and over, adult: Secondary | ICD-10-CM | POA: Diagnosis not present

## 2022-11-30 DIAGNOSIS — K760 Fatty (change of) liver, not elsewhere classified: Secondary | ICD-10-CM

## 2022-11-30 DIAGNOSIS — E782 Mixed hyperlipidemia: Secondary | ICD-10-CM

## 2022-11-30 DIAGNOSIS — R7309 Other abnormal glucose: Secondary | ICD-10-CM | POA: Diagnosis not present

## 2022-11-30 DIAGNOSIS — I10 Essential (primary) hypertension: Secondary | ICD-10-CM

## 2022-11-30 DIAGNOSIS — Z79899 Other long term (current) drug therapy: Secondary | ICD-10-CM

## 2022-11-30 DIAGNOSIS — E538 Deficiency of other specified B group vitamins: Secondary | ICD-10-CM

## 2022-11-30 DIAGNOSIS — F3340 Major depressive disorder, recurrent, in remission, unspecified: Secondary | ICD-10-CM | POA: Diagnosis not present

## 2022-11-30 DIAGNOSIS — K449 Diaphragmatic hernia without obstruction or gangrene: Secondary | ICD-10-CM

## 2022-11-30 NOTE — Patient Instructions (Signed)

## 2022-11-30 NOTE — Progress Notes (Signed)
Follow Up  Assessment and Plan:  Morbid obesity (HCC) BMI 45  Discussed appropriate BMI Diet modification. Physical activity. Encouraged/praised to build confidence.  Essential hypertension Continue Valsartan Discussed DASH (Dietary Approaches to Stop Hypertension) DASH diet is lower in sodium than a typical American diet. Cut back on foods that are high in saturated fat, cholesterol, and trans fats. Eat more whole-grain foods, fish, poultry, and nuts Remain active and exercise as tolerated daily.  Monitor BP at home-Call if greater than 130/80.  Check CMP/CBC  Hyperlipidemia, unspecified hyperlipidemia type Continue Rosuvastatin Discussed lifestyle modifications. Recommended diet heavy in fruits and veggies, omega 3's. Decrease consumption of animal meats, cheeses, and dairy products. Remain active and exercise as tolerated. Continue to monitor. Check lipids/TSH  Medication management All medications discussed and reviewed in full. All questions and concerns regarding medications addressed.    Vitamin D deficiency At goal at recent check; continue to recommend supplementation for goal of 60-100 Monitor levels  Abnormal glucose (hx of prediabetes) Education: Reviewed 'ABCs' of diabetes management  Discussed goals to be met and/or maintained include A1C (<7) Blood pressure (<130/80) Cholesterol (LDL <70) Continue Eye Exam yearly  Continue Dental Exam Q6 mo Discussed dietary recommendations Discussed Physical Activity recommendations Check A1C and insulin  Depression, major, in remission (HCC) In remission off of medications; monitor Lifestyle discussed: diet/exerise, sleep hygiene, stress management, hydration  Hiatal hernia/ GERD Continue PPI/H2 blocker, diet discussed Weight loss  Fatty liver Weight loss advised, avoid alcohol/tylenol, will monitor LFTs  Osteoarthritis Follows with Dr. August Saucer, taking tylenol/meloxicam (limited due to organ functions).  Discussed topicals - voltaren, aspercreme QID, can wrap after application. Icing PRN. Continue with weight loss efforts.   History of kidney stones [facility] following Stay well hydrated  Chronic bacteriuria/hematuria [facility] following  B12 deficiency Monitor levels  GERD No suspected reflux complications (Barret/stricture). Lifestyle modification:  wt loss, avoid meals 2-3h before bedtime. Consider eliminating food triggers:  chocolate, caffeine, EtOH, acid/spicy food.  Orders Placed This Encounter  Procedures   CBC with Differential/Platelet   COMPLETE METABOLIC PANEL WITH GFR   Lipid panel   Notify office for further evaluation and treatment, questions or concerns if any reported s/s fail to improve.   The patient was advised to call back or seek an in-person evaluation if any symptoms worsen or if the condition fails to improve as anticipated.   Further disposition pending results of labs. Discussed med's effects and SE's.    I discussed the assessment and treatment plan with the patient. The patient was provided an opportunity to ask questions and all were answered. The patient agreed with the plan and demonstrated an understanding of the instructions.  Discussed med's effects and SE's. Screening labs and tests as requested with regular follow-up as recommended.  I provided 30 minutes of face-to-face time during this encounter including counseling, chart review, and critical decision making was preformed.   Future Appointments  Date Time Provider Department Center  01/13/2023 11:00 AM Adela Glimpse, NP [facility] None  08/26/2023  9:00 AM Adela Glimpse, NP [facility] None   HPI  67 y.o. female  presents for an annual CPE and follow up. She has Hypertension; Hyperlipidemia; GERD (gastroesophageal reflux disease); Hiatal hernia; Morbid obesity (HCC); Arthritis of hip; Arthritis of shoulder region, right, degenerative; Medication management; Vitamin D  deficiency; Abnormal glucose; Osteoarthritis; Recurrent major depressive disorder, in remission (HCC); B12 deficiency; CKD (chronic kidney disease) stage 3, GFR 30-59 ml/min (HCC); Bilateral knee pain; Fatty liver; Hematuria; Bacteriuria, chronic;  History of kidney stones; and Dysphagia on their problem list.  Overall she reports feeling well.  She has no new concerns in clinic today.  She has had hip and shoulder replacement with Dr. August Saucer, doing well. Does have bil knee pain, arthritis, currently managing with meloxicam 15 mg every 3 days a week, tylenol PM at night. She supplements with topical aspercreme.   She has hx of MDD, recently remains in remission off of medication.   She has chronic bacteriuria and recently with microscopic hematuria, kidney stones, underwent cysto/laser/ureteral stent placement in Nov 2022 with Dr. Kasandra Knudsen. Oxybutynin is beneficial for frequency.   BMI is Body mass index is 45.87 kg/m., she has been working on diet  and exercise. Has taken Phentermine in the past but has stopped d/t SE of diarrhea. Wt Readings from Last 3 Encounters:  11/30/22 250 lb 12.8 oz (113.8 kg)  08/26/22 248 lb 6.4 oz (112.7 kg)  05/25/22 243 lb 12.8 oz (110.6 kg)   Her blood pressure has been controlled at home, today their BP is BP: 120/74.    She does workout. She denies chest pain, shortness of breath, dizziness.   She is on cholesterol medication, she is taking crestor 20 mg daily, heart disease brother died 30, mother with MI in her 86's. Her cholesterol is at goal. The cholesterol last visit was:  Lab Results  Component Value Date   CHOL 139 08/26/2022   HDL 49 (L) 08/26/2022   LDLCALC 64 08/26/2022   TRIG 190 (H) 08/26/2022   CHOLHDL 2.8 08/26/2022  . She has been working on diet and exercise for hx of prediabetes (A1C 5.9% in 2016), she is not on bASA, she is not on ACE/ARB and denies foot ulcerations, hyperglycemia, hypoglycemia , increased appetite, nausea,  paresthesia of the feet, polydipsia, polyuria, visual disturbances, vomiting and weight loss. Last A1C in the office was:  Lab Results  Component Value Date   HGBA1C 5.5 08/26/2022    Last GFR:  Lab Results  Component Value Date   EGFR 68 08/26/2022   Patient is on Vitamin D supplement,10,000.  Lab Results  Component Value Date   VD25OH 16 08/26/2022     She has reduced B12 supplement dose, has 2500 mcg daily Lab Results  Component Value Date   VITAMINB12 >2,000 (H) 08/26/2022    Current Medications:    Current Outpatient Medications (Cardiovascular):    rosuvastatin (CRESTOR) 20 MG tablet, TAKE 1 TABLET DAILY FOR    CHOLESTEROL   valsartan (DIOVAN) 320 MG tablet, TAKE 1 TABLET DAILY   cholestyramine (QUESTRAN) 4 g packet, Take 1 packet by mouth 2 (two) times daily for 3 days.  Current Outpatient Medications (Respiratory):    montelukast (SINGULAIR) 10 MG tablet, Take 1 tablet (10 mg total) by mouth at bedtime.  Current Outpatient Medications (Analgesics):    meloxicam (MOBIC) 15 MG tablet, TAKE 1/2 TO 1 TABLET DAILY AS NEEDED WITH FOOD FOR PAIN AND INFLAMMATION.  LIMIT TO 5 DAYS A WEEK TO PROTECT KIDNEYS  Current Outpatient Medications (Hematological):    Cyanocobalamin (B-12) 2500 MCG SUBL, Place 1 tablet under the tongue once a week.  Current Outpatient Medications (Other):    Cholecalciferol (VITAMIN D) 125 MCG (5000 UT) CAPS, Take 10,000 Units by mouth daily.   cyclobenzaprine (FLEXERIL) 10 MG tablet, TAKE 1/2 1 TABLET BY MOUTH THREE TIMES A DAY AS NEEDED FOR MUSCLE SPASMS   diphenhydramine-acetaminophen (TYLENOL PM) 25-500 MG TABS, Take 2 tablets by mouth at bedtime.  magnesium gluconate (MAGONATE) 500 MG tablet, Take 500 mg by mouth daily.   oxybutynin (DITROPAN) 5 MG tablet, TAKE 1 TABLET TWICE A DAY   pantoprazole (PROTONIX) 40 MG tablet, TAKE 1 TABLET BY MOUTH EVERY DAY   phentermine (ADIPEX-P) 37.5 MG tablet, Take 1/2 to 1 tablet every Morning for Dieting &  Weight Loss (Patient not taking: Reported on 08/26/2022)   topiramate (TOPAMAX) 50 MG tablet, Take 1/2 to 1 tablet 2 x /day at Suppertime & Bedtime for Dieting & Weight Loss (Patient not taking: Reported on 08/26/2022)  Health Maintenance:   Immunization History  Administered Date(s) Administered   Fluad Trivalent(High Dose 65+) 11/05/2022   Influenza Inj Mdck Quad Pf 11/02/2016, 10/23/2019   Influenza Inj Mdck Quad With Preservative 10/23/2018   Influenza, Quadrivalent, Recombinant, Inj, Pf 10/06/2017   Influenza,inj,Quad PF,6+ Mos 10/14/2020, 11/12/2021   Influenza-Unspecified 11/22/2014, 10/31/2015, 10/06/2017   PFIZER(Purple Top)SARS-COV-2 Vaccination 04/30/2019, 05/21/2019, 11/23/2019   PPD Test 06/01/2013   Pneumococcal Polysaccharide-23 06/08/2018   Tdap 03/22/2017   Health Maintenance  Topic Date Due   Zoster Vaccines- Shingrix (1 of 2) Never done   Pneumonia Vaccine 30+ Years old (2 of 2 - PCV) 04/27/2022   COVID-19 Vaccine (4 - 2023-24 season) 09/05/2022   Medicare Annual Wellness (AWV)  02/16/2023   MAMMOGRAM  08/01/2024   Colonoscopy  09/22/2025   Cervical Cancer Screening (HPV/Pap Cotest)  07/10/2026   DTaP/Tdap/Td (2 - Td or Tdap) 03/23/2027   INFLUENZA VACCINE  Completed   DEXA SCAN  Completed   Hepatitis C Screening  Completed   HIV Screening  Completed   HPV VACCINES  Aged Out   Patient Care Team: Lucky Cowboy, MD as PCP - General (Internal Medicine) August Saucer Corrie Mckusick, MD as Consulting Physician (Orthopedic Surgery) Venancio Poisson, MD as Consulting Physician (Dermatology) Rhea Belton, Carie Caddy, MD as Consulting Physician (Gastroenterology)  Medical History:  Past Medical History:  Diagnosis Date   Allergy    Anemia    Arthritis    Asthma    as child but out grew   Cataract    small forming   Diverticula of colon    Environmental allergies    GERD (gastroesophageal reflux disease)    History of kidney stones    Hyperlipidemia    down on meds    Hypertension    Kidney stones 2008   again 8- 2022   Obesity, unspecified    PONV (postoperative nausea and vomiting)    UTI (urinary tract infection)    Vertigo     Allergies Allergies  Allergen Reactions   Keflex [Cephalexin] Anaphylaxis and Swelling   Codeine Nausea And Vomiting   Wellbutrin [Bupropion] Diarrhea    SURGICAL HISTORY She  has a past surgical history that includes Cholecystectomy (1987); Esophageal dilation; Knee arthroscopy (Right, 2007); Total hip arthroplasty (Right, 03/19/2014); Reverse shoulder arthroplasty (Right, 06/04/2014); Breast biopsy (Left); Upper gastrointestinal endoscopy; Colonoscopy; Eye surgery; Wisdom tooth extraction; Cystoscopy with retrograde pyelogram, ureteroscopy and stent placement (Right, 10/28/2020); Holmium laser application (Right, 10/28/2020); Cystoscopy/ureteroscopy/holmium laser/stent placement (Bilateral, 11/18/2020); Cystoscopy with retrograde pyelogram, ureteroscopy and stent placement (Bilateral, 10/20/2021); and Holmium laser application (Bilateral, 10/20/2021). FAMILY HISTORY Her family history includes Cancer in her father; Colon cancer in her mother; Esophageal cancer in her father; Heart disease in her brother and mother; Hyperlipidemia in her mother; Hypertension in her mother; Pulmonary embolism in her brother; Stroke in her mother; Throat cancer in her father. SOCIAL HISTORY She  reports that she has never smoked. She  has never used smokeless tobacco. She reports that she does not drink alcohol and does not use drugs.  Review of Systems: Review of Systems  Constitutional:  Negative for chills, fever, malaise/fatigue and weight loss.  HENT:  Negative for congestion, ear discharge, ear pain, hearing loss, sore throat and tinnitus.   Eyes: Negative.  Negative for blurred vision and double vision.  Respiratory:  Negative for cough, sputum production, shortness of breath and wheezing.   Cardiovascular:  Negative for chest pain,  palpitations, orthopnea, claudication, leg swelling and PND.  Gastrointestinal:  Negative for abdominal pain, blood in stool, constipation, diarrhea, heartburn, melena, nausea and vomiting.  Genitourinary: Negative.  Negative for dysuria, flank pain, frequency, hematuria and urgency.  Musculoskeletal:  Positive for joint pain (knee pain). Negative for falls and myalgias.  Skin: Negative.  Negative for rash.  Neurological:  Negative for dizziness, tingling, sensory change, loss of consciousness, weakness and headaches.  Endo/Heme/Allergies:  Negative for polydipsia.  Psychiatric/Behavioral: Negative.  Negative for depression, hallucinations, memory loss, substance abuse and suicidal ideas. The patient is not nervous/anxious and does not have insomnia.   All other systems reviewed and are negative.   Physical Exam: Estimated body mass index is 45.87 kg/m as calculated from the following:   Height as of this encounter: 5\' 2"  (1.575 m).   Weight as of this encounter: 250 lb 12.8 oz (113.8 kg). BP 120/74   Pulse 82   Temp 97.8 F (36.6 C)   Ht 5\' 2"  (1.575 m)   Wt 250 lb 12.8 oz (113.8 kg)   LMP 10/18/2013   SpO2 99%   BMI 45.87 kg/m   General Appearance: Well nourished well developed, in no apparent distress.  Eyes: PERRLA, EOMs, conjunctiva no swelling or erythema ENT/Mouth: Ear canals normal without obstruction, swelling, erythema, or discharge.  TMs normal bilaterally with no erythema, bulging, retraction, or loss of landmark.  Neck: Supple, thyroid normal, No bruits.  No cervical adenopathy Respiratory: Respiratory effort normal, Breath sounds clear A&P without wheeze, rhonchi, rales.   Cardio: RRR without murmurs, rubs or gallops. Brisk peripheral pulses without edema.  Chest: symmetric, with normal excursions Breasts: defer - no concerns, has upcoming mammogram  Abdomen: Morbidly obese,  Soft, nontender, no guarding, rebound, hernias, masses, or organomegaly.  Lymphatics: Non  tender without lymphadenopathy.  Genitourinary: defer Musculoskeletal: No obvious deformity, ROM limited by body habitus, 4/5 strength, and slow antalgic gait.  Skin: Warm, dry without rashes, lesions, ecchymosis. Neuro: Awake and oriented X 3, Cranial nerves intact, reflexes equal bilaterally. Normal muscle tone, no cerebellar symptoms. Sensation intact.  Psych:  normal affect, Insight and Judgment appropriate.    Alejo Beamer 10:10 AM [facility]

## 2022-12-01 ENCOUNTER — Encounter: Payer: Self-pay | Admitting: Nurse Practitioner

## 2022-12-01 LAB — COMPLETE METABOLIC PANEL WITH GFR
AG Ratio: 1.3 (calc) (ref 1.0–2.5)
ALT: 17 U/L (ref 6–29)
AST: 21 U/L (ref 10–35)
Albumin: 4.3 g/dL (ref 3.6–5.1)
Alkaline phosphatase (APISO): 61 U/L (ref 37–153)
BUN: 17 mg/dL (ref 7–25)
CO2: 26 mmol/L (ref 20–32)
Calcium: 9.7 mg/dL (ref 8.6–10.4)
Chloride: 103 mmol/L (ref 98–110)
Creat: 0.94 mg/dL (ref 0.50–1.05)
Globulin: 3.4 g/dL (ref 1.9–3.7)
Glucose, Bld: 98 mg/dL (ref 65–99)
Potassium: 4.6 mmol/L (ref 3.5–5.3)
Sodium: 140 mmol/L (ref 135–146)
Total Bilirubin: 0.5 mg/dL (ref 0.2–1.2)
Total Protein: 7.7 g/dL (ref 6.1–8.1)
eGFR: 67 mL/min/{1.73_m2} (ref >=60)

## 2022-12-01 LAB — LIPID PANEL
Cholesterol: 132 mg/dL (ref <200)
HDL: 47 mg/dL — ABNORMAL LOW (ref >=50)
LDL Cholesterol (Calc): 59 mg/dL
Non-HDL Cholesterol (Calc): 85 mg/dL (ref <130)
Total CHOL/HDL Ratio: 2.8 (calc) (ref <5.0)
Triglycerides: 190 mg/dL — ABNORMAL HIGH (ref <150)

## 2022-12-01 LAB — CBC WITH DIFFERENTIAL/PLATELET
Absolute Lymphocytes: 1824 {cells}/uL (ref 850–3900)
Absolute Monocytes: 648 {cells}/uL (ref 200–950)
Basophils Absolute: 72 {cells}/uL (ref 0–200)
Basophils Relative: 0.9 %
Eosinophils Absolute: 512 {cells}/uL — ABNORMAL HIGH (ref 15–500)
Eosinophils Relative: 6.4 %
HCT: 42.8 % (ref 35.0–45.0)
Hemoglobin: 14 g/dL (ref 11.7–15.5)
MCH: 30.4 pg (ref 27.0–33.0)
MCHC: 32.7 g/dL (ref 32.0–36.0)
MCV: 92.8 fL (ref 80.0–100.0)
MPV: 10.2 fL (ref 7.5–12.5)
Monocytes Relative: 8.1 %
Neutro Abs: 4944 {cells}/uL (ref 1500–7800)
Neutrophils Relative %: 61.8 %
Platelets: 263 10*3/uL (ref 140–400)
RBC: 4.61 10*6/uL (ref 3.80–5.10)
RDW: 13.2 % (ref 11.0–15.0)
Total Lymphocyte: 22.8 %
WBC: 8 10*3/uL (ref 3.8–10.8)

## 2022-12-01 MED ORDER — PROMETHAZINE-DM 6.25-15 MG/5ML PO SYRP: 5.0000 mL | ORAL_SOLUTION | Freq: Four times a day (QID) | ORAL | 0 refills | Status: AC | PRN

## 2023-01-13 ENCOUNTER — Ambulatory Visit: Payer: Medicare HMO | Admitting: Nurse Practitioner

## 2023-02-05 ENCOUNTER — Other Ambulatory Visit: Payer: Self-pay | Admitting: Nurse Practitioner

## 2023-02-05 DIAGNOSIS — M1990 Unspecified osteoarthritis, unspecified site: Secondary | ICD-10-CM

## 2023-02-28 ENCOUNTER — Other Ambulatory Visit: Payer: Self-pay

## 2023-02-28 DIAGNOSIS — N3946 Mixed incontinence: Secondary | ICD-10-CM

## 2023-02-28 MED ORDER — OXYBUTYNIN CHLORIDE 5 MG PO TABS: 5.0000 mg | ORAL_TABLET | Freq: Two times a day (BID) | ORAL | 2 refills | Status: AC

## 2023-03-11 ENCOUNTER — Ambulatory Visit: Payer: Medicare HMO | Admitting: Nurse Practitioner

## 2023-03-17 ENCOUNTER — Encounter: Payer: Self-pay | Admitting: Family Medicine

## 2023-03-17 DIAGNOSIS — Z1231 Encounter for screening mammogram for malignant neoplasm of breast: Secondary | ICD-10-CM

## 2023-03-30 ENCOUNTER — Other Ambulatory Visit: Payer: Self-pay | Admitting: Family Medicine

## 2023-03-30 DIAGNOSIS — Z1231 Encounter for screening mammogram for malignant neoplasm of breast: Secondary | ICD-10-CM

## 2023-05-27 DIAGNOSIS — H524 Presbyopia: Secondary | ICD-10-CM | POA: Diagnosis not present

## 2023-05-27 DIAGNOSIS — H04123 Dry eye syndrome of bilateral lacrimal glands: Secondary | ICD-10-CM | POA: Diagnosis not present

## 2023-05-27 DIAGNOSIS — H2513 Age-related nuclear cataract, bilateral: Secondary | ICD-10-CM | POA: Diagnosis not present

## 2023-06-20 DIAGNOSIS — H18593 Other hereditary corneal dystrophies, bilateral: Secondary | ICD-10-CM | POA: Diagnosis not present

## 2023-06-21 DIAGNOSIS — E538 Deficiency of other specified B group vitamins: Secondary | ICD-10-CM | POA: Diagnosis not present

## 2023-06-21 DIAGNOSIS — K219 Gastro-esophageal reflux disease without esophagitis: Secondary | ICD-10-CM | POA: Diagnosis not present

## 2023-06-21 DIAGNOSIS — I10 Essential (primary) hypertension: Secondary | ICD-10-CM | POA: Diagnosis not present

## 2023-06-21 DIAGNOSIS — N183 Chronic kidney disease, stage 3 unspecified: Secondary | ICD-10-CM | POA: Diagnosis not present

## 2023-08-04 ENCOUNTER — Ambulatory Visit
Admission: RE | Admit: 2023-08-04 | Discharge: 2023-08-04 | Disposition: A | Discharge status: Home / Self Care | Source: Ambulatory Visit | Attending: Family Medicine | Admitting: Family Medicine

## 2023-08-04 DIAGNOSIS — Z1231 Encounter for screening mammogram for malignant neoplasm of breast: Secondary | ICD-10-CM | POA: Diagnosis not present

## 2023-08-26 ENCOUNTER — Encounter: Payer: Medicare HMO | Admitting: Nurse Practitioner

## 2023-10-19 ENCOUNTER — Ambulatory Visit: Admitting: Orthopedic Surgery

## 2023-11-07 ENCOUNTER — Encounter: Payer: Self-pay | Admitting: Radiology
